# Patient Record
Sex: Male | Born: 1975 | Race: Black or African American | Hispanic: No | Marital: Married | Smoking: Current every day smoker
Health system: Southern US, Community
[De-identification: ages and names within clinical notes are randomized; demographics above are authoritative.]

## PROBLEM LIST (undated history)

## (undated) DIAGNOSIS — E119 Type 2 diabetes mellitus without complications: Secondary | ICD-10-CM

## (undated) DIAGNOSIS — L0291 Cutaneous abscess, unspecified: Secondary | ICD-10-CM

## (undated) DIAGNOSIS — M1990 Unspecified osteoarthritis, unspecified site: Secondary | ICD-10-CM

## (undated) DIAGNOSIS — I10 Essential (primary) hypertension: Secondary | ICD-10-CM

## (undated) DIAGNOSIS — L988 Other specified disorders of the skin and subcutaneous tissue: Secondary | ICD-10-CM

## (undated) DIAGNOSIS — E669 Obesity, unspecified: Secondary | ICD-10-CM

---

## 1998-05-07 ENCOUNTER — Emergency Department (HOSPITAL_COMMUNITY): Admission: EM | Admit: 1998-05-07 | Discharge: 1998-05-07 | Payer: Self-pay | Admitting: Emergency Medicine

## 2000-11-20 ENCOUNTER — Emergency Department (HOSPITAL_COMMUNITY): Admission: EM | Admit: 2000-11-20 | Discharge: 2000-11-20 | Payer: Self-pay | Admitting: Emergency Medicine

## 2003-04-15 ENCOUNTER — Emergency Department (HOSPITAL_COMMUNITY): Admission: EM | Admit: 2003-04-15 | Discharge: 2003-04-15 | Payer: Self-pay | Admitting: Emergency Medicine

## 2003-04-15 ENCOUNTER — Encounter: Payer: Self-pay | Admitting: Emergency Medicine

## 2003-05-22 ENCOUNTER — Emergency Department (HOSPITAL_COMMUNITY): Admission: EM | Admit: 2003-05-22 | Discharge: 2003-05-22 | Payer: Self-pay | Admitting: Emergency Medicine

## 2003-05-22 ENCOUNTER — Encounter: Payer: Self-pay | Admitting: Emergency Medicine

## 2003-10-09 ENCOUNTER — Emergency Department (HOSPITAL_COMMUNITY): Admission: EM | Admit: 2003-10-09 | Discharge: 2003-10-09 | Payer: Self-pay | Admitting: Emergency Medicine

## 2003-11-14 ENCOUNTER — Emergency Department (HOSPITAL_COMMUNITY): Admission: EM | Admit: 2003-11-14 | Discharge: 2003-11-14 | Payer: Self-pay | Admitting: Emergency Medicine

## 2004-10-19 ENCOUNTER — Emergency Department (HOSPITAL_COMMUNITY): Admission: EM | Admit: 2004-10-19 | Discharge: 2004-10-19 | Payer: Self-pay | Admitting: Family Medicine

## 2005-02-26 ENCOUNTER — Emergency Department (HOSPITAL_COMMUNITY): Admission: EM | Admit: 2005-02-26 | Discharge: 2005-02-26 | Payer: Self-pay | Admitting: Emergency Medicine

## 2005-08-03 ENCOUNTER — Emergency Department (HOSPITAL_COMMUNITY): Admission: EM | Admit: 2005-08-03 | Discharge: 2005-08-03 | Payer: Self-pay | Admitting: Family Medicine

## 2005-10-22 ENCOUNTER — Emergency Department (HOSPITAL_COMMUNITY): Admission: EM | Admit: 2005-10-22 | Discharge: 2005-10-22 | Payer: Self-pay | Admitting: Emergency Medicine

## 2005-11-03 ENCOUNTER — Emergency Department (HOSPITAL_COMMUNITY): Admission: EM | Admit: 2005-11-03 | Discharge: 2005-11-03 | Payer: Self-pay | Admitting: Family Medicine

## 2012-10-25 ENCOUNTER — Encounter (HOSPITAL_COMMUNITY): Payer: Self-pay | Admitting: *Deleted

## 2012-10-25 ENCOUNTER — Emergency Department (HOSPITAL_COMMUNITY)
Admission: EM | Admit: 2012-10-25 | Discharge: 2012-10-25 | Disposition: A | Discharge status: Home / Self Care | Payer: Self-pay | Attending: Emergency Medicine | Admitting: Emergency Medicine

## 2012-10-25 ENCOUNTER — Emergency Department (INDEPENDENT_AMBULATORY_CARE_PROVIDER_SITE_OTHER)
Admission: EM | Admit: 2012-10-25 | Discharge: 2012-10-25 | Disposition: A | Discharge status: Nursing Home (Includes ICF) | Payer: Self-pay | Source: Home / Self Care | Attending: Family Medicine | Admitting: Family Medicine

## 2012-10-25 ENCOUNTER — Emergency Department (HOSPITAL_COMMUNITY): Admission: EM | Admit: 2012-10-25 | Discharge: 2012-10-25 | Discharge status: Against Medical Advice | Payer: Self-pay

## 2012-10-25 DIAGNOSIS — Z79899 Other long term (current) drug therapy: Secondary | ICD-10-CM | POA: Insufficient documentation

## 2012-10-25 DIAGNOSIS — L0390 Cellulitis, unspecified: Secondary | ICD-10-CM

## 2012-10-25 DIAGNOSIS — I10 Essential (primary) hypertension: Secondary | ICD-10-CM | POA: Insufficient documentation

## 2012-10-25 DIAGNOSIS — F17200 Nicotine dependence, unspecified, uncomplicated: Secondary | ICD-10-CM | POA: Insufficient documentation

## 2012-10-25 DIAGNOSIS — L0291 Cutaneous abscess, unspecified: Secondary | ICD-10-CM

## 2012-10-25 DIAGNOSIS — L0211 Cutaneous abscess of neck: Secondary | ICD-10-CM | POA: Insufficient documentation

## 2012-10-25 DIAGNOSIS — I169 Hypertensive crisis, unspecified: Secondary | ICD-10-CM

## 2012-10-25 HISTORY — DX: Essential (primary) hypertension: I10

## 2012-10-25 HISTORY — DX: Cutaneous abscess, unspecified: L02.91

## 2012-10-25 LAB — POCT I-STAT, CHEM 8
BUN: 6 mg/dL (ref 6–23)
Calcium, Ion: 1.18 mmol/L (ref 1.12–1.23)
Chloride: 109 mEq/L (ref 96–112)
Creatinine, Ser: 1.2 mg/dL (ref 0.50–1.35)
Glucose, Bld: 102 mg/dL — ABNORMAL HIGH (ref 70–99)
Potassium: 3.9 mEq/L (ref 3.5–5.1)

## 2012-10-25 LAB — COMPREHENSIVE METABOLIC PANEL
ALT: 69 U/L — ABNORMAL HIGH (ref 0–53)
AST: 91 U/L — ABNORMAL HIGH (ref 0–37)
Albumin: 3.8 g/dL (ref 3.5–5.2)
CO2: 19 mEq/L (ref 19–32)
Calcium: 9.7 mg/dL (ref 8.4–10.5)
Creatinine, Ser: 0.97 mg/dL (ref 0.50–1.35)
Sodium: 136 mEq/L (ref 135–145)
Total Protein: 8.1 g/dL (ref 6.0–8.3)

## 2012-10-25 LAB — CBC
MCH: 34.1 pg — ABNORMAL HIGH (ref 26.0–34.0)
Platelets: 313 10*3/uL (ref 150–400)
RBC: 4.64 MIL/uL (ref 4.22–5.81)
RDW: 13 % (ref 11.5–15.5)

## 2012-10-25 MED ORDER — CEPHALEXIN 500 MG PO CAPS: 500.0000 mg | ORAL_CAPSULE | Freq: Four times a day (QID) | ORAL | Status: DC

## 2012-10-25 MED ORDER — HYDROCODONE-ACETAMINOPHEN 5-325 MG PO TABS
2.0000 | ORAL_TABLET | Freq: Once | ORAL | Status: AC
  Administered 2012-10-25: 2 via ORAL

## 2012-10-25 MED ORDER — SULFAMETHOXAZOLE-TRIMETHOPRIM 800-160 MG PO TABS: 2.0000 | ORAL_TABLET | Freq: Two times a day (BID) | ORAL | Status: AC

## 2012-10-25 MED ORDER — HYDROCODONE-ACETAMINOPHEN 5-325 MG PO TABS: 2.0000 | ORAL_TABLET | Freq: Four times a day (QID) | ORAL | Status: DC | PRN

## 2012-10-25 MED ORDER — HYDROCODONE-ACETAMINOPHEN 5-325 MG PO TABS
ORAL_TABLET | ORAL | Status: AC
  Filled 2012-10-25: qty 2

## 2012-10-25 NOTE — ED Notes (Signed)
Pt is alert and oriented. Ambulatory. HR 97. Pain to back of neck and left shoulder/back. Abscess to back of neck, no drainage at this time.

## 2012-10-25 NOTE — ED Notes (Signed)
PT was sent here from [facility] for elevated HR and complicated abscess that needs draining.  Pt c/o pain to post neck that radiates down spine and to both shoulders.  Pt is being seen by dermatologist for re-current skin infections.  PT tachycardic and in great pain.

## 2012-10-25 NOTE — ED Provider Notes (Signed)
History     CSN: [id number]  Arrival date & time 10/25/12  1708   First MD Initiated Contact with Patient 10/25/12 1807      Chief Complaint  Patient presents with  . Neck Pain    (Consider location/radiation/quality/duration/timing/severity/associated sxs/prior treatment) HPI Comments: Patient is a 37 y/o male who presents to the ED complaining of neck pain and elevated blood pressure.  Patient went to UC and was sent here because he was told he had an elevated HR and a complicated abscess that needed to be drained at the ED.  Abscess located on the posterior neck.  Patient noticed abscess two weeks ago.  Area becoming progressively worse.  Abscess is painful.   Pain is a 10/10  pain that radiates down his back and his left trapezius.  He states that he finds minor relief of pain with the use of heating packs, ibuprofen, Goody's powder, Xanax, alcohol usage, and smoking.  Pain is aggravated by movement.  Patient states that he has gotten several abscesses over the past four to five years, and have had several I&D's.  He has had abscesses and incisions done on this area of his neck in the past.  He denies fever or chills.  He has not noticed any drainage from this area.  No nausea or vomiting.    Patient is a 37 y.o. male presenting with abscess. The history is provided by the patient. No language interpreter was used.  Abscess  Pertinent negatives include no fever and no vomiting.    Past Medical History  Diagnosis Date  . Abscess     recurrent infections  . Hypertension     No past surgical history on file.  No family history on file.  History  Substance Use Topics  . Smoking status: Current Every Day Smoker  . Smokeless tobacco: Not on file  . Alcohol Use: Yes      Review of Systems  Constitutional: Negative for fever and chills.  Respiratory: Negative for chest tightness and shortness of breath.   Gastrointestinal: Negative for vomiting.  Neurological: Negative for  dizziness and light-headedness.  All other systems reviewed and are negative.    Allergies  Review of patient's allergies indicates no known allergies.  Home Medications   Current Outpatient Rx  Name  Route  Sig  Dispense  Refill  . ALPRAZOLAM 1 MG PO TABS   Oral   Take 1 mg by mouth at bedtime as needed. For sleep         . GOODY HEADACHE PO   Oral   Take 2 tablets by mouth every 2 (two) hours as needed. For pain         . IBUPROFEN 600 MG PO TABS   Oral   Take 600 mg by mouth at bedtime as needed. For pain         . SULFAMETHOXAZOLE-TMP DS 800-160 MG PO TABS   Oral   Take 1 tablet by mouth 2 (two) times daily.           BP 162/102  Pulse 113  Temp 98.1 F (36.7 C) (Oral)  Resp 18  SpO2 99%  Physical Exam  Nursing note and vitals reviewed. Constitutional: He is oriented to person, place, and time. He appears well-developed and well-nourished. No distress.  HENT:  Head: Normocephalic and atraumatic.  Mouth/Throat: Oropharynx is clear and moist. No oropharyngeal exudate.  Eyes: Conjunctivae normal and EOM are normal. Pupils are equal, round, and reactive to  light. Right eye exhibits no discharge. Left eye exhibits no discharge. No scleral icterus.  Neck: Normal range of motion. No Brudzinski's sign noted.    Cardiovascular: Normal rate, regular rhythm and normal heart sounds.  Exam reveals no gallop and no friction rub.   No murmur heard. Pulmonary/Chest: Effort normal and breath sounds normal. No respiratory distress. He has no wheezes. He has no rales. He exhibits no tenderness.  Musculoskeletal: Normal range of motion.  Lymphadenopathy:    He has cervical adenopathy.  Neurological: He is alert and oriented to person, place, and time. No cranial nerve deficit.  Skin: Skin is warm and dry. No rash noted. He is not diaphoretic. No erythema. No pallor.  Psychiatric: He has a normal mood and affect. His behavior is normal.    ED Course  Procedures  (including critical care time)  Labs Reviewed - No data to display No results found.   No diagnosis found.  INCISION AND DRAINAGE Performed by: Anne Shutter, Destanee Bedonie Consent: Verbal consent obtained. Risks and benefits: risks, benefits and alternatives were discussed Type: abscess  Body area: posterior neck  Anesthesia: local infiltration  Incision was made with a scalpel.  Local anesthetic: lidocaine 2% with epinephrine  Anesthetic total: 5 ml  Complexity: complex Blunt dissection to break up loculations  Drainage: purulent  Drainage amount: large  Patient tolerance: Patient tolerated the procedure well with no immediate complications.    MDM  Patient with skin abscess amenable to incision and drainage.  Abscess was not large enough to warrant packing or drain,  wound recheck in 2 days. Encouraged home warm soaks and flushing.  Some signs of cellulitis is surrounding skin.  Will d/c to home.  Patient discharged home with short course of antibiotics and pain medication.        Pascal Lux Sheldon, PA-C 10/26/12 [phone]

## 2012-10-25 NOTE — ED Notes (Signed)
Pt has  A  Boil  On the  Back   Of  His  Neck          Which  Her  reports  He  Has  Had  For  sev   Weeks   His  bp is  High   -  He  Has  No  histrory      Of  Any  htn  He  Is  Obese    Drinks  And  Smokes   He  denys  Any  Chest pain or  Shortness  Of  Breath      He  Does report  He  Has  Had  High  Blood  Pressure  In past

## 2012-10-25 NOTE — ED Notes (Signed)
Pt discharged.Vital signs stable and GCS 15 

## 2012-10-25 NOTE — ED Notes (Deleted)
Pt c/o abd pain onset yesterday. Nausea without vomiting.  Last BM yesterday.  St's had elevated temp yesterday.  Pt also c/o productive cough

## 2012-10-25 NOTE — ED Provider Notes (Signed)
History     CSN: [id number]  Arrival date & time 10/25/12  1525   First MD Initiated Contact with Patient 10/25/12 1530      Chief Complaint  Patient presents with  . Recurrent Skin Infections    (Consider location/radiation/quality/duration/timing/severity/associated sxs/prior treatment) HPI Comments: 37 year old smoker male with history of morbid obesity and untreated hypertension. Admits to frequent alcohol drinking. Here complaining of neck pain for one week worsening in the last 2 days associated with chills and general malaise. Denies blurry vision, chest pain or shortness of breath. Also denies extremity weakness, numbness or paresthesias, no balance or gait problems. States that he's had recurrent skin infections that have required incision and drainage in the back of his neck. Denies prior diagnosis of diabetes.    History reviewed. No pertinent past medical history.  History reviewed. No pertinent past surgical history.  No family history on file.  History  Substance Use Topics  . Smoking status: Current Every Day Smoker  . Smokeless tobacco: Not on file  . Alcohol Use: Yes      Review of Systems  Constitutional: Positive for chills. Negative for fever.  Eyes: Negative for visual disturbance.  Respiratory: Negative for chest tightness and shortness of breath.   Cardiovascular: Negative for chest pain.  Skin: Positive for rash.  Neurological: Negative for dizziness and headaches.    Allergies  Review of patient's allergies indicates no known allergies.  Home Medications   Current Outpatient Rx  Name  Route  Sig  Dispense  Refill  . SEPTRA PO   Oral   Take by mouth.           BP 207/129  Pulse 123  Temp 99.8 F (37.7 C) (Oral)  Resp 20  SpO2 95%  Physical Exam  Nursing note and vitals reviewed. Constitutional: He is oriented to person, place, and time. He appears well-developed and well-nourished. No distress.  HENT:  Head: Normocephalic and  atraumatic.  Right Ear: External ear normal.  Left Ear: External ear normal.  Mouth/Throat: Oropharynx is clear and moist. No oropharyngeal exudate.  Eyes: Conjunctivae normal and EOM are normal. Pupils are equal, round, and reactive to light. Right eye exhibits no discharge. Left eye exhibits no discharge. No scleral icterus.  Neck: Neck supple. No JVD present. No thyromegaly present.  Cardiovascular: Normal rate, regular rhythm and normal heart sounds.   Pulmonary/Chest: Effort normal and breath sounds normal.  Lymphadenopathy:    He has no cervical adenopathy.  Neurological: He is alert and oriented to person, place, and time. No cranial nerve deficit. He exhibits normal muscle tone. Coordination normal.  Skin: He is not diaphoretic.       There is a large area over 10 cm diameter of erythema induration and tenderness in the suboccipital area. There is central fluctuation. Consistent with a large abscess. No spontaneous drainage.    ED Course  Procedures (including critical care time)  Labs Reviewed  POCT I-STAT, CHEM 8 - Abnormal; Notable for the following:    Glucose, Bld 102 (*)     All other components within normal limits  CBC  COMPREHENSIVE METABOLIC PANEL   No results found.   1. Cellulitis and abscess   2. Hypertensive crisis     EKG with sinus tachycardia and ventricular rate at 118 beats per minutes, LVH signs  No acute ischemic changes.    MDM   37 year old smoker male with history of morbid obesity and untreated hypertension. Here complaining of neck pain  for one week worsening in the last 2 days associated with chills and general malaise.  On exam: Afebrile, tachycardic with a heart rate in the 120s. Hypertensive with a blood pressure of 207/129. There is an area of severe tenderness and induration over 10 cm diameter in the suboccipital area there is a central fluctuation and skin erythema with chronic scarring. Normal electrolytes and creatinine no hyperglycemia  on i-STAT.Patient was given Norco oral 500/10mg  here prior to transfer to the emergency department. CBC and complete metabolic panel results pending at the time of transfer Decided to transfer to the emergency department for further evaluation and management.        Sharin Grave, MD 10/25/12 1642

## 2012-10-26 NOTE — ED Provider Notes (Signed)
Medical screening examination/treatment/procedure(s) were performed by non-physician practitioner and as supervising physician I was immediately available for consultation/collaboration.   Gwyneth Sprout, MD 10/26/12 [phone]

## 2013-07-09 ENCOUNTER — Encounter (HOSPITAL_COMMUNITY): Payer: Self-pay | Admitting: *Deleted

## 2013-07-09 ENCOUNTER — Emergency Department (HOSPITAL_COMMUNITY)
Admission: EM | Admit: 2013-07-09 | Discharge: 2013-07-09 | Disposition: A | Discharge status: Home / Self Care | Payer: Self-pay | Attending: Emergency Medicine | Admitting: Emergency Medicine

## 2013-07-09 DIAGNOSIS — I10 Essential (primary) hypertension: Secondary | ICD-10-CM | POA: Insufficient documentation

## 2013-07-09 DIAGNOSIS — L0390 Cellulitis, unspecified: Secondary | ICD-10-CM

## 2013-07-09 DIAGNOSIS — L0211 Cutaneous abscess of neck: Secondary | ICD-10-CM | POA: Insufficient documentation

## 2013-07-09 DIAGNOSIS — L0291 Cutaneous abscess, unspecified: Secondary | ICD-10-CM

## 2013-07-09 DIAGNOSIS — F17200 Nicotine dependence, unspecified, uncomplicated: Secondary | ICD-10-CM | POA: Insufficient documentation

## 2013-07-09 DIAGNOSIS — E669 Obesity, unspecified: Secondary | ICD-10-CM | POA: Insufficient documentation

## 2013-07-09 HISTORY — DX: Obesity, unspecified: E66.9

## 2013-07-09 MED ORDER — IBUPROFEN 800 MG PO TABS: 800.0000 mg | ORAL_TABLET | Freq: Three times a day (TID) | ORAL | Status: DC

## 2013-07-09 MED ORDER — SULFAMETHOXAZOLE-TRIMETHOPRIM 800-160 MG PO TABS: 1.0000 | ORAL_TABLET | Freq: Two times a day (BID) | ORAL | Status: DC

## 2013-07-09 MED ORDER — TRAMADOL HCL 50 MG PO TABS
50.0000 mg | ORAL_TABLET | Freq: Once | ORAL | Status: AC
  Administered 2013-07-09: 50 mg via ORAL
  Filled 2013-07-09: qty 1

## 2013-07-09 MED ORDER — CEPHALEXIN 500 MG PO CAPS: 500.0000 mg | ORAL_CAPSULE | Freq: Four times a day (QID) | ORAL | Status: DC

## 2013-07-09 NOTE — ED Notes (Signed)
Reports having abscess to back of neck x 2 weeks, causing pain. Airway intact.

## 2013-07-09 NOTE — ED Notes (Signed)
Abscess to posterior neck. Hx of same.

## 2013-07-09 NOTE — ED Provider Notes (Signed)
CSN: [id number]     Arrival date & time 07/09/13  1018 History   First MD Initiated Contact with Patient 07/09/13 1116     Chief Complaint  Patient presents with  . Abscess   (Consider location/radiation/quality/duration/timing/severity/associated sxs/prior Treatment) HPI Comments: Patient is a 37 yo M PMHx significant for obesity presenting to the ED for recurrent posterior neck abscess that has been worsening over the last two weeks. Patient states the area has been becoming more and more tender to the touch. He states he has tried warm compresses with minimal relief. He states touching the area aggravates his pain. Patient states he feels as though the area is increasing in size. He denies any fevers, chills, drainage from abscess, headache.    Past Medical History  Diagnosis Date  . Abscess     recurrent infections  . Hypertension   . Obesity    History reviewed. No pertinent past surgical history. History reviewed. No pertinent family history. History  Substance Use Topics  . Smoking status: Current Every Day Smoker  . Smokeless tobacco: Not on file  . Alcohol Use: Yes    Review of Systems  Constitutional: Negative for fever and chills.  HENT: Negative for facial swelling, neck pain and neck stiffness.   Respiratory: Negative for shortness of breath.   Cardiovascular: Negative for chest pain.  Skin: Positive for color change and wound.  Neurological: Negative for syncope and headaches.  All other systems reviewed and are negative.    Allergies  Review of patient's allergies indicates no known allergies.  Home Medications   Current Outpatient Rx  Name  Route  Sig  Dispense  Refill  . ibuprofen (ADVIL,MOTRIN) 600 MG tablet   Oral   Take 600 mg by mouth at bedtime as needed. For pain          BP 180/112  Pulse 109  Temp(Src) 97.9 F (36.6 C)  Resp 20  SpO2 94% Physical Exam  Constitutional: He is oriented to person, place, and time. He appears  well-developed and well-nourished. No distress.  HENT:  Head: Normocephalic and atraumatic.  Right Ear: External ear normal.  Left Ear: External ear normal.  Nose: Nose normal.  Mouth/Throat: Oropharynx is clear and moist. No oropharyngeal exudate.  Eyes: Conjunctivae are normal.  Neck: Normal range of motion and full passive range of motion without pain. Neck supple. No spinous process tenderness and no muscular tenderness present. No rigidity. No edema present.  Cardiovascular: Normal rate.   Pulmonary/Chest: Effort normal and breath sounds normal.  Lymphadenopathy:    He has no cervical adenopathy.  Neurological: He is alert and oriented to person, place, and time.  Skin: Skin is warm and dry. He is not diaphoretic.  6 cm x 1 cm fluctuant area on posterior neck w/ mild surrounding erythema and warmth. No drainage. TTP.   Psychiatric: He has a normal mood and affect.    ED Course  Procedures (including critical care time) Medications  traMADol (ULTRAM) tablet 50 mg (50 mg Oral Given 07/09/13 1214)   INCISION AND DRAINAGE Performed by: Francee Piccolo L Consent: Verbal consent obtained. Risks and benefits: risks, benefits and alternatives were discussed Type: abscess  Body area: posterior neck  Anesthesia: local infiltration  Incision was made with a scalpel.  Local anesthetic: lidocaine 2% w/ epinephrine  Anesthetic total: 4 ml  Complexity: complex Blunt dissection to break up loculations  Drainage: purulent  Drainage amount: moderate  Patient tolerance: Patient tolerated the procedure well with  no immediate complications.    Labs Review Labs Reviewed - No data to display Imaging Review No results found.  MDM   1. Abscess   2. Cellulitis     Afebrile, NAD, non-toxic appearing, AAOx4.   Suspect uncomplicated cellulitis based on limited area of involvement, minimal pain, no systemic signs of illness (eg, fever, chills, dehydration, altered mental  status, tachypnea, tachycardia, hypotension), no risk factors for serious illness (eg, extremes of age, general debility, immunocompromised status). PE reveals redness, swelling, mildly tender, warm to touch. Skin intact, No bleeding. No bullae. Non purulent. Non circumferential.  Fluctuant area. Borders are not elevated or sharply demarcated. Will prescribed Bactrim and Keflex to cover for MRSA, direct pt to apply warm compresses and to return to ED for I&D if pain should increase or abscess should develop. Advised PCP f/u.   Patient noted to be hypertensive in the emergency department.  No signs of hypertensive urgency.  Discussed with patient the need for close follow-up and management by their primary care physician.   Patient agreeable to plan. Patient is stable at time of discharge    Jeannetta Ellis, PA-C 07/09/13 1459

## 2013-07-09 NOTE — ED Provider Notes (Signed)
Medical screening examination/treatment/procedure(s) were performed by non-physician practitioner and as supervising physician I was immediately available for consultation/collaboration.  Rebeca Valdivia M Clydine Parkison, MD 07/09/13 1558 

## 2013-11-06 ENCOUNTER — Encounter (HOSPITAL_COMMUNITY): Payer: Self-pay | Admitting: Emergency Medicine

## 2013-11-06 ENCOUNTER — Emergency Department (HOSPITAL_COMMUNITY)
Admission: EM | Admit: 2013-11-06 | Discharge: 2013-11-06 | Disposition: A | Discharge status: Home / Self Care | Payer: Self-pay | Source: Home / Self Care

## 2013-11-06 DIAGNOSIS — L03221 Cellulitis of neck: Secondary | ICD-10-CM

## 2013-11-06 DIAGNOSIS — L0211 Cutaneous abscess of neck: Secondary | ICD-10-CM

## 2013-11-06 MED ORDER — CEPHALEXIN 500 MG PO CAPS: 500.0000 mg | ORAL_CAPSULE | Freq: Three times a day (TID) | ORAL | Status: DC

## 2013-11-06 MED ORDER — SULFAMETHOXAZOLE-TRIMETHOPRIM 800-160 MG PO TABS: 1.0000 | ORAL_TABLET | Freq: Two times a day (BID) | ORAL | Status: AC

## 2013-11-06 NOTE — Discharge Instructions (Signed)
Abscess An abscess is an infected area that contains a collection of pus and debris.It can occur in almost any part of the body. An abscess is also known as a furuncle or boil. CAUSES  An abscess occurs when tissue gets infected. This can occur from blockage of oil or sweat glands, infection of hair follicles, or a minor injury to the skin. As the body tries to fight the infection, pus collects in the area and creates pressure under the skin. This pressure causes pain. People with weakened immune systems have difficulty fighting infections and get certain abscesses more often.  SYMPTOMS Usually an abscess develops on the skin and becomes a painful mass that is red, warm, and tender. If the abscess forms under the skin, you may feel a moveable soft area under the skin. Some abscesses break open (rupture) on their own, but most will continue to get worse without care. The infection can spread deeper into the body and eventually into the bloodstream, causing you to feel ill.  DIAGNOSIS  Your caregiver will take your medical history and perform a physical exam. A sample of fluid may also be taken from the abscess to determine what is causing your infection. TREATMENT  Your caregiver may prescribe antibiotic medicines to fight the infection. However, taking antibiotics alone usually does not cure an abscess. Your caregiver may need to make a small cut (incision) in the abscess to drain the pus. In some cases, gauze is packed into the abscess to reduce pain and to continue draining the area. HOME CARE INSTRUCTIONS   Only take over-the-counter or prescription medicines for pain, discomfort, or fever as directed by your caregiver.  If you were prescribed antibiotics, take them as directed. Finish them even if you start to feel better.  If gauze is used, follow your caregiver's directions for changing the gauze.  To avoid spreading the infection:  Keep your draining abscess covered with a  bandage.  Wash your hands well.  Do not share personal care items, towels, or whirlpools with others.  Avoid skin contact with others.  Keep your skin and clothes clean around the abscess.  Keep all follow-up appointments as directed by your caregiver. SEEK MEDICAL CARE IF:   You have increased pain, swelling, redness, fluid drainage, or bleeding.  You have muscle aches, chills, or a general ill feeling.  You have a fever. MAKE SURE YOU:   Understand these instructions.  Will watch your condition.  Will get help right away if you are not doing well or get worse. Document Released: 07/06/2005 Document Revised: 03/27/2012 Document Reviewed: 12/09/2011 [employer] Patient Information 2014 [employer].  

## 2013-11-06 NOTE — ED Provider Notes (Signed)
CSN: [id number]     Arrival date & time 11/06/13  1224 History   First MD Initiated Contact with Patient 11/06/13 1404     Chief Complaint  Patient presents with  . Abscess   (Consider location/radiation/quality/duration/timing/severity/associated sxs/prior Treatment) HPI Comments: 38 year old morbidly obese male presents with recurring abscesses. This one is located in the back of the neck where most of them are. He states about 3-4 days ago the abscess grew quite large and covered the inferior aspect of the occiput as well as most of the posterior neck. He states it spontaneously ruptured and he has been applying warm compresses and now is approximately 3 cm in length with 1 cm surrounding induration. He says it is greatly improved. It has been incised and drained several times both at the urgent care in emergency department. He is here for antibiotics. He is under the impression that he should be taking cephalexin and Septra on a continual, long-term basis.   Past Medical History  Diagnosis Date  . Abscess     recurrent infections  . Hypertension   . Obesity    History reviewed. No pertinent past surgical history. History reviewed. No pertinent family history. History  Substance Use Topics  . Smoking status: Current Every Day Smoker  . Smokeless tobacco: Not on file  . Alcohol Use: Yes    Review of Systems  Constitutional: Negative.   Respiratory: Negative.   Gastrointestinal: Negative.   Genitourinary: Negative.   Neurological: Negative.     Allergies  Review of patient's allergies indicates no known allergies.  Home Medications   Current Outpatient Rx  Name  Route  Sig  Dispense  Refill  . cephALEXin (KEFLEX) 500 MG capsule   Oral   Take 1 capsule (500 mg total) by mouth 3 (three) times daily.   42 capsule   0   . sulfamethoxazole-trimethoprim (BACTRIM DS,SEPTRA DS) 800-160 MG per tablet   Oral   Take 1 tablet by mouth 2 (two) times daily.   28 tablet   0     BP 165/99  Pulse 112  Temp(Src) 99.2 F (37.3 C) (Oral)  Resp 18  Ht 5\' 10"  (1.778 m)  Wt 300 lb (136.079 kg)  BMI 43.05 kg/m2  SpO2 97% Physical Exam  Nursing note and vitals reviewed. Constitutional: He is oriented to person, place, and time. He appears well-developed. No distress.  Morbidly obese  Eyes: Conjunctivae and EOM are normal.  Neck: Normal range of motion. Neck supple.  Pulmonary/Chest: Effort normal. No respiratory distress.  Neurological: He is alert and oriented to person, place, and time.  Skin: Skin is warm and dry.  There is a approximately 3 cm in length area of superficial fluctuants with approximately 1 cm of induration surrounding it. No erythema or lymphangitis. There is no longer swelling and tenderness of the scalp or bilateral neck. He has full range of motion of the neck.  Psychiatric: He has a normal mood and affect.    ED Course  Procedures (including critical care time) Labs Review Labs Reviewed - No data to display Imaging Review No results found.    MDM   1. Abscess of neck    Septra DS 1 twice a day for 14 days Keflex 500 mg 3 times a day for 14 days Explained to the patient that if he was advised to take long-term antibiotics that he would have to discuss this with her PCP in that the urgent care when I been able to  supply this. One problem is that there is no documentation of culture results from the prior incisions and drainage. Therefore, we are uncertain as to the organism that were treating. There is no drainage during this exam and the patient does not want an incision made to obtain a culture. Patient states both Keflex and Septra worked in the past but usually used together. Continue the warm compresses and start antibiotics.    Hayden Rasmussenavid Mabe, NP 11/06/13 1431  Medical screening examination/treatment/procedure(s) were performed by a resident physician or non-physician practitioner and as the supervising physician I was  immediately available for consultation/collaboration.  Clementeen GrahamEvan Haydon Kalmar, MD    Rodolph BongEvan S Cherae Marton, MD 11/06/13 [phone]

## 2013-11-06 NOTE — ED Notes (Signed)
Pt  Recurrent      Boils     Back   Of  Neck         Pt  Reports the  abcess bursted  2  Days  Ago  Now  The  Area  Is    Hard to  Touch     Pt  Reports  He  Has  Been  Applying  Warm  Compresses  To  The  Affected  Area

## 2014-03-20 ENCOUNTER — Encounter (HOSPITAL_COMMUNITY): Payer: Self-pay | Admitting: Emergency Medicine

## 2014-03-20 ENCOUNTER — Emergency Department (INDEPENDENT_AMBULATORY_CARE_PROVIDER_SITE_OTHER)
Admission: EM | Admit: 2014-03-20 | Discharge: 2014-03-20 | Disposition: A | Discharge status: Home / Self Care | Payer: No Typology Code available for payment source | Source: Home / Self Care

## 2014-03-20 DIAGNOSIS — S81009A Unspecified open wound, unspecified knee, initial encounter: Secondary | ICD-10-CM

## 2014-03-20 DIAGNOSIS — X58XXXA Exposure to other specified factors, initial encounter: Secondary | ICD-10-CM

## 2014-03-20 DIAGNOSIS — L089 Local infection of the skin and subcutaneous tissue, unspecified: Secondary | ICD-10-CM

## 2014-03-20 DIAGNOSIS — S91009A Unspecified open wound, unspecified ankle, initial encounter: Secondary | ICD-10-CM

## 2014-03-20 DIAGNOSIS — S81809A Unspecified open wound, unspecified lower leg, initial encounter: Secondary | ICD-10-CM

## 2014-03-20 DIAGNOSIS — S81811A Laceration without foreign body, right lower leg, initial encounter: Principal | ICD-10-CM

## 2014-03-20 DIAGNOSIS — S8780XA Crushing injury of unspecified lower leg, initial encounter: Secondary | ICD-10-CM

## 2014-03-20 DIAGNOSIS — Z23 Encounter for immunization: Secondary | ICD-10-CM

## 2014-03-20 MED ORDER — HYDROCODONE-ACETAMINOPHEN 5-325 MG PO TABS: 1.0000 | ORAL_TABLET | ORAL | Status: DC | PRN

## 2014-03-20 MED ORDER — CEPHALEXIN 500 MG PO CAPS: 500.0000 mg | ORAL_CAPSULE | Freq: Four times a day (QID) | ORAL | Status: DC

## 2014-03-20 MED ORDER — TETANUS-DIPHTH-ACELL PERTUSSIS 5-2.5-18.5 LF-MCG/0.5 IM SUSP
0.5000 mL | Freq: Once | INTRAMUSCULAR | Status: AC
  Administered 2014-03-20: 0.5 mL via INTRAMUSCULAR

## 2014-03-20 MED ORDER — TETANUS-DIPHTH-ACELL PERTUSSIS 5-2.5-18.5 LF-MCG/0.5 IM SUSP
INTRAMUSCULAR | Status: AC
  Filled 2014-03-20: qty 0.5

## 2014-03-20 NOTE — ED Provider Notes (Signed)
CSN: [id number]     Arrival date & time 03/20/14  1558 History   First MD Initiated Contact with Patient 03/20/14 1641     Chief Complaint  Patient presents with  . Leg Injury   (Consider location/radiation/quality/duration/timing/severity/associated sxs/prior Treatment) HPI Comments: 38 year old morbidly obese male presents with a vertical laceration to the distal right anterior shin. This laceration was produced by the corner of a metal toolbox. He elected not to seek medical attention. He has been cleaning it with alcohol and peroxide. There is now underlying swelling of the distal fourth of the shin and the wound is healing by secondary intention but draining a clear to yellow fluid.   Past Medical History  Diagnosis Date  . Abscess     recurrent infections  . Hypertension   . Obesity    History reviewed. No pertinent past surgical history. History reviewed. No pertinent family history. History  Substance Use Topics  . Smoking status: Current Every Day Smoker  . Smokeless tobacco: Not on file  . Alcohol Use: Yes    Review of Systems  Constitutional: Negative for fever.  Respiratory: Negative.   Gastrointestinal: Negative.   Genitourinary: Negative.   Skin: Positive for wound.  Neurological: Negative.     Allergies  Review of patient's allergies indicates no known allergies.  Home Medications   Prior to Admission medications   Medication Sig Start Date End Date Taking? Authorizing Provider  cephALEXin (KEFLEX) 500 MG capsule Take 1 capsule (500 mg total) by mouth 4 (four) times daily. 03/20/14   Hayden Rasmussen, NP   BP 197/111  Pulse 103  Temp(Src) 98.4 F (36.9 C) (Oral)  Resp 20  SpO2 97% Physical Exam  Nursing note and vitals reviewed. Constitutional: He is oriented to person, place, and time. He appears well-developed. No distress.  Pulmonary/Chest: Effort normal. No respiratory distress.  Neurological: He is alert and oriented to person, place, and time. He  exhibits normal muscle tone.  Skin: No rash noted.  8.5 cm x 2.5 cm vertical wound healing by secondary intention with a clear to yellow drainage. Minor erythema along the edges with swelling and mild induration in the the wound and surrounding the wound in the distal were of the right lower leg. No purulence.  Psychiatric: He has a normal mood and affect.    ED Course  Procedures (including critical care time) Labs Review Labs Reviewed - No data to display  Imaging Review No results found.   MDM   1. Laceration of right lower leg with infection     Keflex qid Tdap Wound cleaning and non stick dressing Clean wound with soap and water bid. norco 5 mg #15        Hayden Rasmussen, NP 03/20/14 1709

## 2014-03-20 NOTE — ED Notes (Signed)
Pt  Reports   He  Injured  His  r  Leg  About  3  Weeks  Ago  He        Scraped it on  Some  Metal      Lac  Present  With purullent  Material    Present  Unsure  As  To  His  Last  Tetanus  Shot        Also  Reports   Boil  On  Neck

## 2014-03-20 NOTE — Discharge Instructions (Signed)
Laceration, Old, Not Sutured A laceration is a cut or lesion that goes through all layers of the skin and into the tissue just beneath the skin. Usually these are stitched up or held together with tape or glue shortly after an injury. However, if several or more hours have passed before getting care, too many germs (bacteria) get into the wound. Stitching it closed at this point brings the risk of infection. If your caregiver feels your laceration is too old, it is sometimes left open and dressed regularly to allow healing from the bottom layer up. HOME CARE INSTRUCTIONS   You should change the dressing twice a day or as instructed by your caregiver. If the bandage or wound packing sticks, soak it off with soapy water. When you redress your wound, make sure that the dressing or packing goes all the way to the bottom of the wound. The top of the wound is kept open so it can heal from the bottom up. There is less chance for infection with this method.  Twice a day, wash the area with soap and water to remove all the creams or ointments if used. Rinse off the soap. Pat dry with a clean towel. Look for signs of infection (see below).  If the bandage becomes wet, dirty, or develops a foul smell, change it as soon as possible. Use a nonstick bandaid then cover with a more bulky dressing.  Only take over-the-counter or prescription medicines for pain, discomfort, or fever as directed by your caregiver. You might need a tetanus shot now if:  You have no idea when you had the last one.  You have never had a tetanus shot before.  Your cut had dirt in it.  Your lacertaion was dirty, and your last tetanus shot was more than 7 years ago.  Your laceration was clean, and your last tetanus shot was more than 10 years ago. If you need a tetanus shot, and you decide not to get one, there is a rare chance of getting tetanus. Sickness from tetanus can be serious. If you got a tetanus shot, your arm may swell, get  red and warm to the touch at the shot site. This is common and not a problem. SEEK MEDICAL CARE IF:   There is redness, swelling, or increasing pain in the wound.  There is a red line that goes up your arm or leg.  Pus is coming from wound.  You develop an unexplained oral temperature above 102 F (38.9 C).  You notice a foul smell coming from the wound or dressing.  You notice something coming out of the wound such as wood or glass.  The wound is on your hand or foot and you find that you are unable to properly move a finger or toe.  There is severe swelling around the wound causing pain and numbness.  There is a change in color in your arm, hand, leg, or foot. MAKE SURE YOU:   Understand these instructions.  Will watch your condition.  Will get help right away if you are not doing well or get worse. Document Released: 08/24/2006 Document Revised: 12/19/2011 Document Reviewed: 03/16/2009 [facility] Patient Information 2014 [place], [place].

## 2014-03-26 NOTE — ED Provider Notes (Signed)
Medical screening examination/treatment/procedure(s) were performed by a resident physician or non-physician practitioner and as the supervising physician I was immediately available for consultation/collaboration.  Shenique Childers, MD    Tasmin Exantus S Aaliyah Cancro, MD 03/26/14 0734 

## 2014-04-01 ENCOUNTER — Emergency Department (HOSPITAL_COMMUNITY)
Admission: EM | Admit: 2014-04-01 | Discharge: 2014-04-01 | Disposition: A | Discharge status: Home / Self Care | Payer: No Typology Code available for payment source | Source: Home / Self Care

## 2014-04-01 ENCOUNTER — Encounter (HOSPITAL_COMMUNITY): Payer: Self-pay | Admitting: Emergency Medicine

## 2014-04-01 DIAGNOSIS — T798XXA Other early complications of trauma, initial encounter: Secondary | ICD-10-CM

## 2014-04-01 DIAGNOSIS — IMO0002 Reserved for concepts with insufficient information to code with codable children: Secondary | ICD-10-CM

## 2014-04-01 DIAGNOSIS — L089 Local infection of the skin and subcutaneous tissue, unspecified: Secondary | ICD-10-CM

## 2014-04-01 DIAGNOSIS — X58XXXA Exposure to other specified factors, initial encounter: Secondary | ICD-10-CM

## 2014-04-01 DIAGNOSIS — T148XXA Other injury of unspecified body region, initial encounter: Secondary | ICD-10-CM

## 2014-04-01 MED ORDER — KETOROLAC TROMETHAMINE 60 MG/2ML IM SOLN
60.0000 mg | Freq: Once | INTRAMUSCULAR | Status: AC
  Administered 2014-04-01: 60 mg via INTRAMUSCULAR

## 2014-04-01 MED ORDER — KETOROLAC TROMETHAMINE 60 MG/2ML IM SOLN
INTRAMUSCULAR | Status: AC
  Filled 2014-04-01: qty 2

## 2014-04-01 MED ORDER — OXYCODONE-ACETAMINOPHEN 5-325 MG PO TABS: 1.0000 | ORAL_TABLET | Freq: Once | ORAL | Status: DC

## 2014-04-01 MED ORDER — DOXYCYCLINE HYCLATE 100 MG PO TABS: 100.0000 mg | ORAL_TABLET | Freq: Two times a day (BID) | ORAL | Status: DC

## 2014-04-01 MED ORDER — LIDOCAINE-EPINEPHRINE-TETRACAINE (LET) SOLUTION
3.0000 mL | Freq: Once | NASAL | Status: AC
  Administered 2014-04-01: 3 mL via TOPICAL

## 2014-04-01 MED ORDER — OXYCODONE-ACETAMINOPHEN 5-325 MG PO TABS: 1.0000 | ORAL_TABLET | ORAL | Status: DC | PRN

## 2014-04-01 NOTE — Discharge Instructions (Signed)
Your wound is healing very slowly and was infected The infection was cleaned out today Please do the wet to dry bandage changes daily Please come back in 1 week for further evaulation Please start the new antibitotics You can stop the wet to dry changes once the wound has stopped draining.

## 2014-04-01 NOTE — ED Provider Notes (Signed)
CSN: [id number]     Arrival date & time 04/01/14  1850 History   None    Chief Complaint  Patient presents with  . Follow-up   (Consider location/radiation/quality/duration/timing/severity/associated sxs/prior Treatment) HPI  Seen on 6/11 for laceration from metal toolbox. Started on Keflex w/ improvement. 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 Not sure if getting better. Daily washing w/ soap. Discharging clear material.    Past Medical History  Diagnosis Date  . Abscess     recurrent infections  . Hypertension   . Obesity    History reviewed. No pertinent past surgical history. No family history on file. History  Substance Use Topics  . Smoking status: Current Every Day Smoker -- 0.50 packs/day  . Smokeless tobacco: Not on file  . Alcohol Use: Yes    Review of Systems Per hpi w/ all other systems negative Allergies  Review of patient's allergies indicates no known allergies.  Home Medications   Prior to Admission medications   Medication Sig Start Date End Date Taking? Authorizing Provider  cephALEXin (KEFLEX) 500 MG capsule Take 1 capsule (500 mg total) by mouth 4 (four) times daily. 03/20/14  Yes Hayden Rasmussenavid Mabe, NP  HYDROcodone-acetaminophen (NORCO/VICODIN) 5-325 MG per tablet Take 1 tablet by mouth every 4 (four) hours as needed. 03/20/14  Yes Hayden Rasmussenavid Mabe, NP  doxycycline (VIBRA-TABS) 100 MG tablet Take 1 tablet (100 mg total) by mouth 2 (two) times daily. 04/01/14   Ozella Rocksavid J Merrell, MD  oxyCODONE-acetaminophen (PERCOCET/ROXICET) 5-325 MG per tablet Take 1 tablet by mouth every 4 (four) hours as needed for severe pain. 04/01/14   Ozella Rocksavid J Merrell, MD   BP 157/79  Pulse 114  Temp(Src) 97.8 F (36.6 C) (Oral)  SpO2 95% Physical Exam  Constitutional: He appears well-developed and well-nourished. No distress.  Eyes: EOM are normal. Pupils are equal, round, and reactive to light.  Cardiovascular: Normal rate, normal heart sounds and intact distal pulses.   Pulmonary/Chest: Effort  normal. No respiratory distress.  Abdominal: Soft. He exhibits no distension.  Musculoskeletal: Normal range of motion. He exhibits no edema.  Neurological: He is alert. He exhibits normal muscle tone.  Skin: He is not diaphoretic.  Large 8.5x1.5cm longitudinal laceration w/ central necrosis of the R anterior leg. surounding induration and ttp present. W/o purulent discharge.     2% lidocaine placed directly on wound and allowed to anesthetise skin. Wound debrided of nectoric tissue w/ blunt edge of qtip adn cotton swab. Pt tolerated well.    ED Course  Procedures (including critical care time) Labs Review Labs Reviewed - No data to display  Imaging Review No results found.   MDM   1. Wound infection, initial encounter   2. Laceration    Poor healing to this point. Wound debrided. Wet to dry bandage applied and pt instructed to continue daily wet to dry changes to keep the nectrotic tissue burden down. Pt to start doxy for additional coverage (MRSA). Unlikely pseudo as pt w/o DM. Percocet to use for pain as debridement will cause pain  Shelly Flattenavid Merrell, MD Family Medicine PGY-3 04/01/2014, 8:46 PM      Ozella Rocksavid J Merrell, MD 04/01/14 2046

## 2014-04-01 NOTE — ED Notes (Signed)
Applied LET to pt's wound site per Dr. Konrad DoloresMerrell

## 2014-04-01 NOTE — ED Notes (Signed)
Pt is here for a f/u for laceration to right lower extremity Seen here on 6/11; given Keflex and hydrocodone  Reports he'll probably need more meds; not getting any better Alert w/no signs of acute distress.

## 2014-04-04 NOTE — ED Provider Notes (Signed)
Medical screening examination/treatment/procedure(s) were performed by resident physician or non-physician practitioner and as supervising physician I was immediately available for consultation/collaboration.   KINDL,JAMES DOUGLAS MD.   James D Kindl, MD 04/04/14 1007 

## 2014-04-10 ENCOUNTER — Emergency Department (HOSPITAL_COMMUNITY)
Admission: EM | Admit: 2014-04-10 | Discharge: 2014-04-10 | Disposition: A | Discharge status: Home / Self Care | Payer: No Typology Code available for payment source | Source: Home / Self Care

## 2014-04-10 ENCOUNTER — Encounter (HOSPITAL_COMMUNITY): Payer: Self-pay | Admitting: Emergency Medicine

## 2014-04-10 DIAGNOSIS — S81009A Unspecified open wound, unspecified knee, initial encounter: Secondary | ICD-10-CM

## 2014-04-10 DIAGNOSIS — S81809A Unspecified open wound, unspecified lower leg, initial encounter: Secondary | ICD-10-CM

## 2014-04-10 DIAGNOSIS — Z5189 Encounter for other specified aftercare: Secondary | ICD-10-CM

## 2014-04-10 DIAGNOSIS — S91009A Unspecified open wound, unspecified ankle, initial encounter: Secondary | ICD-10-CM

## 2014-04-10 DIAGNOSIS — X58XXXA Exposure to other specified factors, initial encounter: Secondary | ICD-10-CM

## 2014-04-10 MED ORDER — HYDROCODONE-ACETAMINOPHEN 7.5-325 MG PO TABS: 1.0000 | ORAL_TABLET | ORAL | Status: DC | PRN

## 2014-04-10 NOTE — ED Notes (Signed)
Right lower leg wound check.  Patient feels this is improving, is requesting more antibiotic and pain medicine.

## 2014-04-10 NOTE — ED Provider Notes (Signed)
CSN: [id number]     Arrival date & time 04/10/14  1930 History   None    Chief Complaint  Patient presents with  . Wound Check   (Consider location/radiation/quality/duration/timing/severity/associated sxs/prior Treatment) HPI Comments: For wound check of non sutured laceration of the R lower leg. Has been on several weeks of ABX and narcotics. Is healing well by secondary intension. No signs of infection. No drainage or purulence   Past Medical History  Diagnosis Date  . Abscess     recurrent infections  . Hypertension   . Obesity    History reviewed. No pertinent past surgical history. History reviewed. No pertinent family history. History  Substance Use Topics  . Smoking status: Current Every Day Smoker -- 0.50 packs/day  . Smokeless tobacco: Not on file  . Alcohol Use: Yes    Review of Systems  Constitutional: Negative.   Skin: Positive for wound.    Allergies  Review of patient's allergies indicates no known allergies.  Home Medications   Prior to Admission medications   Medication Sig Start Date End Date Taking? Authorizing Provider  HYDROcodone-acetaminophen (NORCO) 7.5-325 MG per tablet Take 1 tablet by mouth every 4 (four) hours as needed. 04/10/14   Hayden Rasmussenavid Hazelynn Mckenny, NP   BP 168/99  Pulse 117  Temp(Src) 99.6 F (37.6 C) (Oral)  Resp 18  SpO2 100% Physical Exam  Nursing note and vitals reviewed. Constitutional: He is oriented to person, place, and time. He appears well-developed and well-nourished.  Neurological: He is alert and oriented to person, place, and time.  Skin: Skin is warm and dry. No erythema.    ED Course  Procedures (including critical care time) Labs Review Labs Reviewed - No data to display  Imaging Review No results found.   MDM   1. Encounter for post-traumatic wound check     Keepclean and dry, may cont wet to dry dressings. Norco 7.5 mg #15. Unlikely the wound is as painful as he describes. On narcotics and ABX for nearly 6  weeks now.     Hayden Rasmussenavid Hesham Womac, NP 04/10/14 [id number]  Hayden Rasmussenavid Issaac Shipper, NP 04/10/14 2051

## 2014-04-11 NOTE — ED Provider Notes (Signed)
Medical screening examination/treatment/procedure(s) were performed by a resident physician or non-physician practitioner and as the supervising physician I was immediately available for consultation/collaboration.  Alder Murri, MD    Clorine Swing S Lazette Estala, MD 04/11/14 1652 

## 2014-05-13 ENCOUNTER — Emergency Department (HOSPITAL_COMMUNITY)
Admission: EM | Admit: 2014-05-13 | Discharge: 2014-05-13 | Disposition: A | Discharge status: Home / Self Care | Payer: No Typology Code available for payment source | Source: Home / Self Care | Attending: Emergency Medicine | Admitting: Emergency Medicine

## 2014-05-13 ENCOUNTER — Encounter (HOSPITAL_COMMUNITY): Payer: Self-pay | Admitting: Emergency Medicine

## 2014-05-13 DIAGNOSIS — L0292 Furuncle, unspecified: Secondary | ICD-10-CM

## 2014-05-13 DIAGNOSIS — L0293 Carbuncle, unspecified: Secondary | ICD-10-CM

## 2014-05-13 MED ORDER — OXYCODONE-ACETAMINOPHEN 5-325 MG PO TABS: 2.0000 | ORAL_TABLET | ORAL | Status: DC | PRN

## 2014-05-13 MED ORDER — SULFAMETHOXAZOLE-TMP DS 800-160 MG PO TABS: 1.0000 | ORAL_TABLET | Freq: Two times a day (BID) | ORAL | Status: DC

## 2014-05-13 NOTE — ED Provider Notes (Signed)
CSN: [id number]     Arrival date & time 05/13/14  1711 History   First MD Initiated Contact with Patient 05/13/14 1723     Chief Complaint  Patient presents with  . Cyst   (Consider location/radiation/quality/duration/timing/severity/associated sxs/prior Treatment) HPI He is here today for evaluation of neck boil. He states this is a recurrent problem for the last year. It started about a week ago. He has been applying warm compresses, but has not seen any drainage. It is associated with neck pain. He states this is his typical presentation of the abscess. No fevers, chills, nausea, vomiting. He would like to avoid an I&D if possible. He has an appointment with her primary care physician for referral to general surgery for removal of the cyst.  Past Medical History  Diagnosis Date  . Abscess     recurrent infections  . Hypertension   . Obesity    History reviewed. No pertinent past surgical history. Family History  Problem Relation Age of Onset  . Cancer Father    History  Substance Use Topics  . Smoking status: Current Every Day Smoker -- 0.50 packs/day    Types: Cigarettes  . Smokeless tobacco: Not on file  . Alcohol Use: 1.2 oz/week    2 Cans of beer per week     Comment: on weekends    Review of Systems  Constitutional: Negative.   Skin: Positive for wound.       boil    Allergies  Review of patient's allergies indicates no known allergies.  Home Medications   Prior to Admission medications   Medication Sig Start Date End Date Taking? Authorizing Provider  oxyCODONE-acetaminophen (PERCOCET/ROXICET) 5-325 MG per tablet Take 2 tablets by mouth every 4 (four) hours as needed for severe pain. 05/13/14   Charm RingsErin J Emaad Nanna, MD  sulfamethoxazole-trimethoprim (BACTRIM DS) 800-160 MG per tablet Take 1 tablet by mouth 2 (two) times daily. 05/13/14   Charm RingsErin J Kiyon Fidalgo, MD   BP 134/95  Pulse 120  Temp(Src) 97.5 F (36.4 C) (Oral)  SpO2 96% Physical Exam  Constitutional: He appears  well-developed and well-nourished. No distress.  Cardiovascular: Tachycardia present.   Skin:  Right leg: wound healing by secondary intention; no erythema or purulent drainage. Neck: 4x2cm area of induration across back of neck, small area of fluctuance, watery purulent drainage from opening in prior I&D incision    ED Course  Procedures (including critical care time) Labs Review Labs Reviewed - No data to display  Imaging Review No results found.   MDM   1. Recurrent boils    He is tachycardic today. However it does not have any chest pain. On review of chart his pulse is typically between 100-120.  Given patient preference and that it is starting to drain on its own, will not perform I&D today. Bactrim twice a day x10 days. Percocet #20 for pain. Continue warm compresses. Followup in 3 days if it is not improving. Definitive therapy will require referral to general surgery for removal of cyst. He is working on this.    Charm RingsErin J Mitzy Naron, MD 05/13/14 [id number]

## 2014-05-13 NOTE — ED Notes (Signed)
C/o neck boil onset 4 -7 days ago and has gotten prog. Bigger and more painful.  He usually get Keflex and Percocet for his boils.  Has applied warm compress and wants to know if it needs drained yet. Hx. frequent boils.  He cut his R lower leg 2 mos. Ago and did not get it sutured.  Wants to know if it is infected.  Occasionally drains pus when it scabs up.  It has gotten smaller.

## 2014-05-13 NOTE — Discharge Instructions (Signed)
Start the Bactrim 1 pill twice a day for 10 days. Continue to apply warm compresses several times a day.  If it is not improving by Friday, please come back.  It will likely need to be drained at that time.

## 2014-05-17 ENCOUNTER — Emergency Department (HOSPITAL_COMMUNITY)
Admission: EM | Admit: 2014-05-17 | Discharge: 2014-05-17 | Discharge status: Absent without leave | Payer: No Typology Code available for payment source | Source: Home / Self Care

## 2014-05-26 ENCOUNTER — Emergency Department (HOSPITAL_COMMUNITY)
Admission: EM | Admit: 2014-05-26 | Discharge: 2014-05-26 | Disposition: A | Discharge status: Home / Self Care | Payer: No Typology Code available for payment source | Source: Home / Self Care | Attending: Family Medicine | Admitting: Family Medicine

## 2014-05-26 ENCOUNTER — Encounter (HOSPITAL_COMMUNITY): Payer: Self-pay | Admitting: Emergency Medicine

## 2014-05-26 DIAGNOSIS — L0390 Cellulitis, unspecified: Secondary | ICD-10-CM

## 2014-05-26 DIAGNOSIS — R000 Tachycardia, unspecified: Secondary | ICD-10-CM

## 2014-05-26 DIAGNOSIS — L0291 Cutaneous abscess, unspecified: Secondary | ICD-10-CM

## 2014-05-26 MED ORDER — DOXYCYCLINE HYCLATE 100 MG PO CAPS: 100.0000 mg | ORAL_CAPSULE | Freq: Two times a day (BID) | ORAL | Status: DC

## 2014-05-26 MED ORDER — OXYCODONE-ACETAMINOPHEN 5-325 MG PO TABS: 2.0000 | ORAL_TABLET | ORAL | Status: DC | PRN

## 2014-05-26 NOTE — ED Provider Notes (Signed)
Glenn Murphy is a 38 y.o. male who presents to Urgent Care today for abscess. Patient has a 3 to four-week history of abscess of the posterior neck. He was seen here about 10 days ago for a draining abscess. At that time a joint decision was made to not perform incision and drainage as it was already draining. He was given Bactrim antibiotics. He's completed a course of antibiotics and notes continued pain and swelling. It has worsened recently. He denies any fevers or chills nausea vomiting or diarrhea. He notes that he typically has an elevated heart rate and denies any chest pain or palpitations. He is working to establish with a primary care Dr.   Past Medical History  Diagnosis Date  . Abscess     recurrent infections  . Hypertension   . Obesity    History  Substance Use Topics  . Smoking status: Current Every Day Smoker -- 0.50 packs/day    Types: Cigarettes  . Smokeless tobacco: Not on file  . Alcohol Use: 1.2 oz/week    2 Cans of beer per week     Comment: on weekends   ROS as above Medications: No current facility-administered medications for this encounter.   Current Outpatient Prescriptions  Medication Sig Dispense Refill  . doxycycline (VIBRAMYCIN) 100 MG capsule Take 1 capsule (100 mg total) by mouth 2 (two) times daily.  14 capsule  0  . oxyCODONE-acetaminophen (PERCOCET/ROXICET) 5-325 MG per tablet Take 2 tablets by mouth every 4 (four) hours as needed for severe pain.  10 tablet  0    Exam:  BP 156/106  Pulse 120  Temp(Src) 99.2 F (37.3 C) (Oral)  Resp 18  SpO2 100% Gen: Well NAD morbidly obese Skin of the posterior neck: large 5 x 3 cm fluctuant abscess. Tender to touch.  Incision and drainage of abscess:  Consent obtained and timeout performed Area cleaned with alcohol Cold spray applied and 5 mL of lidocaine with epinephrine injected to achieve a good anesthesia. A large 1 cm incision was made to the fluctuance. A large amount of pus was  expressed and cultured.  The incision was enlarged and turned into a circle. Blunt dissection was used to break up loculations. The wound is packed with 10 inches of 1/2 inch packing material. Patient tolerated the procedure well  No results found for this or any previous visit (from the past 24 hour(s)). No results found.  Assessment and Plan: 38 y.o. male with abscess. Heart abscess of the posterior neck. Treatment with doxycycline. Culture pending. Please see instructions for abscess care.  Discussed warning signs or symptoms. Please see discharge instructions. Patient expresses understanding.   This note was created using Conservation officer, historic buildings. Any transcription errors are unintended.    Rodolph Bong, MD 05/26/14 [phone]

## 2014-05-26 NOTE — ED Notes (Signed)
Pt reports abscess back of neck/scalp onset 3-4 weeks Seen here  On 8/4; given antibiotics Getting bigger and painful No signs of acute distress.

## 2014-05-26 NOTE — Discharge Instructions (Signed)
Thank you for coming in today. Remove the packing in about 3 days. Come back as needed. Followup with your primary care Dr. About your heart rate Call or go to the emergency room if you get worse, have trouble breathing, have chest pains, or palpitations.     Abscess Care After An abscess (also called a boil or furuncle) is an infected area that contains a collection of pus. Signs and symptoms of an abscess include pain, tenderness, redness, or hardness, or you may feel a moveable soft area under your skin. An abscess can occur anywhere in the body. The infection may spread to surrounding tissues causing cellulitis. A cut (incision) by the surgeon was made over your abscess and the pus was drained out. Gauze may have been packed into the space to provide a drain that will allow the cavity to heal from the inside outwards. The boil may be painful for 5 to 7 days. Most people with a boil do not have high fevers. Your abscess, if seen early, may not have localized, and may not have been lanced. If not, another appointment may be required for this if it does not get better on its own or with medications. HOME CARE INSTRUCTIONS   Only take over-the-counter or prescription medicines for pain, discomfort, or fever as directed by your caregiver.  When you bathe, soak and then remove gauze or iodoform packs at least daily or as directed by your caregiver. You may then wash the wound gently with mild soapy water. Repack with gauze or do as your caregiver directs. SEEK IMMEDIATE MEDICAL CARE IF:   You develop increased pain, swelling, redness, drainage, or bleeding in the wound site.  You develop signs of generalized infection including muscle aches, chills, fever, or a general ill feeling.  An oral temperature above 102 F (38.9 C) develops, not controlled by medication. See your caregiver for a recheck if you develop any of the symptoms described above. If medications (antibiotics) were prescribed,  take them as directed. Document Released: 04/14/2005 Document Revised: 12/19/2011 Document Reviewed: 12/10/2007 [facility] Patient Information 2015 [place]. This information is not intended to replace advice given to you by your health care provider. Make sure you discuss any questions you have with your health care provider.

## 2014-05-30 LAB — CULTURE, ROUTINE-ABSCESS: SPECIAL REQUESTS: NORMAL

## 2014-06-01 NOTE — ED Notes (Addendum)
Abscess culture posterior neck:  Rare Diphtheroids (corynebacterium species).  Pt. adequately treated with Doxycycline. Vassie Moselle 06/01/2014 No further action needed per Dr. Denyse Amass. 06/04/2014

## 2015-05-07 ENCOUNTER — Emergency Department (HOSPITAL_COMMUNITY)
Admission: EM | Admit: 2015-05-07 | Discharge: 2015-05-07 | Disposition: A | Discharge status: Home / Self Care | Payer: Self-pay

## 2015-05-07 ENCOUNTER — Encounter (HOSPITAL_COMMUNITY): Payer: Self-pay | Admitting: Emergency Medicine

## 2015-05-07 NOTE — ED Notes (Signed)
Pt states "I don't want to be seen, the boil popped." Pt ambulates out without distress. Alert x4 respirations easy non labored.

## 2015-05-07 NOTE — ED Notes (Signed)
Pt. Stated, I have about 4 boils they come up about every 2 weeks.  Glenn Murphy been dealing with this all my life.

## 2015-05-27 ENCOUNTER — Emergency Department (HOSPITAL_COMMUNITY)
Admission: EM | Admit: 2015-05-27 | Discharge: 2015-05-27 | Disposition: A | Discharge status: Home / Self Care | Payer: Self-pay | Source: Home / Self Care | Attending: Family Medicine | Admitting: Family Medicine

## 2015-05-27 ENCOUNTER — Encounter (HOSPITAL_COMMUNITY): Payer: Self-pay | Admitting: *Deleted

## 2015-05-27 DIAGNOSIS — L02411 Cutaneous abscess of right axilla: Secondary | ICD-10-CM

## 2015-05-27 DIAGNOSIS — M109 Gout, unspecified: Secondary | ICD-10-CM

## 2015-05-27 DIAGNOSIS — M1000 Idiopathic gout, unspecified site: Secondary | ICD-10-CM

## 2015-05-27 LAB — URIC ACID: Uric Acid, Serum: 8.7 mg/dL — ABNORMAL HIGH (ref 4.4–7.6)

## 2015-05-27 MED ORDER — COLCHICINE 0.6 MG PO TABS: 0.6000 mg | ORAL_TABLET | Freq: Two times a day (BID) | ORAL | Status: DC

## 2015-05-27 MED ORDER — COLCHICINE 0.6 MG PO TABS
0.6000 mg | ORAL_TABLET | Freq: Two times a day (BID) | ORAL | Status: DC
Start: 2015-05-27 — End: 2016-01-15

## 2015-05-27 MED ORDER — NAPROXEN 500 MG PO TABS: 500.0000 mg | ORAL_TABLET | Freq: Two times a day (BID) | ORAL | Status: DC

## 2015-05-27 NOTE — Discharge Instructions (Signed)
Take medicine as prescribed, see your doctors as planned

## 2015-05-27 NOTE — ED Notes (Addendum)
Pt   Has   Multiple  Boils         Neck  Back      And  Under  Arm     -     Has  Had  These  Symptoms    Before  Pt  Has been treated  By  Dr  August Saucer  With  Anti  Biotics   And  Pain meds         Pt  Has  A history  Of  Hypertension   Pt  Also  Reports  Pain  r  Big  Toe  With  swelling

## 2015-05-27 NOTE — ED Provider Notes (Signed)
CSN: [id number]     Arrival date & time 05/27/15  1805 History   First MD Initiated Contact with Patient 05/27/15 1845     Chief Complaint  Patient presents with  . Abscess   (Consider location/radiation/quality/duration/timing/severity/associated sxs/prior Treatment) Patient is a 39 y.o. male presenting with abscess and lower extremity pain. The history is provided by the patient.  Abscess Location:  Shoulder/arm and head/neck Head/neck abscess location:  L neck Shoulder/arm abscess location:  R axilla Abscess quality: fluctuance, painful and warmth   Red streaking: no   Progression:  Worsening Pain details:    Severity:  Moderate   Duration:  1 week   Progression:  Worsening Chronicity:  Chronic Ineffective treatments:  Oral antibiotics Associated symptoms: no fever   Risk factors: prior abscess   Foot Pain This is a new problem. The current episode started more than 2 days ago. The problem has been gradually worsening. Associated symptoms comments: Right gt toe sts and pain . The symptoms are aggravated by walking.    Past Medical History  Diagnosis Date  . Abscess     recurrent infections  . Hypertension   . Obesity    History reviewed. No pertinent past surgical history. Family History  Problem Relation Age of Onset  . Cancer Father    Social History  Substance Use Topics  . Smoking status: Current Every Day Smoker -- 0.50 packs/day    Types: Cigarettes  . Smokeless tobacco: None  . Alcohol Use: 1.2 oz/week    2 Cans of beer per week     Comment: on weekends    Review of Systems  Constitutional: Negative for fever.  Musculoskeletal: Positive for gait problem.  Skin: Positive for rash.    Allergies  Review of patient's allergies indicates no known allergies.  Home Medications   Prior to Admission medications   Medication Sig Start Date End Date Taking? Authorizing Provider  colchicine 0.6 MG tablet Take 1 tablet (0.6 mg total) by mouth 2 (two) times  daily. 05/27/15   Linna Hoff, MD  doxycycline (VIBRAMYCIN) 100 MG capsule Take 1 capsule (100 mg total) by mouth 2 (two) times daily. 05/26/14   Rodolph Bong, MD  naproxen (NAPROSYN) 500 MG tablet Take 1 tablet (500 mg total) by mouth 2 (two) times daily with a meal. 05/27/15   Linna Hoff, MD  oxyCODONE-acetaminophen (PERCOCET/ROXICET) 5-325 MG per tablet Take 2 tablets by mouth every 4 (four) hours as needed for severe pain. 05/26/14   Rodolph Bong, MD   BP 141/69 mmHg  Pulse 91  Temp(Src) 98.5 F (36.9 C) (Oral)  Resp 16  SpO2 100% Physical Exam  Constitutional: He is oriented to person, place, and time. He appears well-developed and well-nourished.  Musculoskeletal: He exhibits tenderness.  Neurological: He is alert and oriented to person, place, and time.  Skin: Rash noted.  Nursing note and vitals reviewed.   ED Course  INCISION AND DRAINAGE Date/Time: 05/27/2015 7:22 PM Performed by: Bradd Canary D Authorized by: Bradd Canary D Consent: Verbal consent obtained. Consent given by: patient Type: abscess Body area: upper extremity Location details: right arm Local anesthetic: topical anesthetic Patient sedated: no Scalpel size: 11 Incision type: single straight Complexity: simple Drainage: purulent Drainage amount: copious Wound treatment: wound left open Patient tolerance: Patient tolerated the procedure well with no immediate complications   (including critical care time) Labs Review Labs Reviewed  URIC ACID    Imaging Review No results found.  MDM   1. Gout of big toe   2. Abscess of axilla, right        Linna Hoff, MD 05/27/15 [phone]

## 2015-05-28 NOTE — ED Notes (Signed)
Called patient to discuss uric acid report that suggests gout . Advised to fill colchicine Rx if he has not already, and follow up w his PCP for dietary management of future gout

## 2015-05-29 NOTE — ED Notes (Signed)
Patient called to inquire about more affordable Rx , as the colchicine is $300 . Discussed w Dr Artis Flock, who authorized alopurinol 300 mg, po , qd x 30 pills. Called to [facility], spoke to pharmacist, and have relayed their message that it will be ready in 10-15 min

## 2015-12-28 ENCOUNTER — Ambulatory Visit: Payer: Self-pay | Admitting: Surgery

## 2015-12-28 NOTE — H&P (Signed)
History of Present Illness Glenn Murphy(Kayly Kriegel K. Annaka Cleaver MD; 12/28/2015 12:16 PM) Patient words: neck cyst.  The patient is a 40 year old male who presents with a complaint of Mass. Referred by Dr. Gean MaidensWhitworth [facility] for multiple cysts  This is a 40 year old male with morbid obesity who presents with long history of multiple skin issues. He has chronic infections that appear in his axilla and in his posterior neck. These tend to chronically drain purulent fluid. He has also had previous infected sebaceous cysts of the upper back as well as his posterior left shoulder. He has also developed 2 sebaceous cyst on his forehead just above his eyebrows. He would like to have the chronically infected areas of his axilla and posterior neck excised. He would also like to have the cyst removed before they recur or become infected.   Other Problems Gilmer Mor(Sonya Bynum, CMA; 12/28/2015 10:21 AM) Arthritis  Past Surgical History Gilmer Mor(Sonya Bynum, CMA; 12/28/2015 10:21 AM) No pertinent past surgical history  Diagnostic Studies History Gilmer Mor(Sonya Bynum, CMA; 12/28/2015 10:21 AM) Colonoscopy never  Allergies (Sonya Bynum, CMA; 12/28/2015 10:22 AM) No Known Drug Allergies 12/28/2015  Medication History (Sonya Bynum, CMA; 12/28/2015 10:22 AM) LORazepam (1MG  Tablet, Oral) Active. Oxycodone-Acetaminophen (10-325MG  Tablet, Oral) Active. Allopurinol (300MG  Tablet, Oral) Active. Clindamycin HCl (300MG  Capsule, Oral) Active. Gabapentin (400MG  Capsule, Oral) Active. MetFORMIN HCl (1000MG  Tablet, Oral) Active. Metoprolol Succinate ER (25MG  Tablet ER 24HR, Oral) Active. Naproxen (500MG  Tablet, Oral) Active. RifAMPin (300MG  Capsule, Oral) Active. Triamcinolone Acetonide (0.1% Cream, External) Active. Triamterene-HCTZ (37.5-25MG  Tablet, Oral) Active. Medications Reconciled  Social History Gilmer Mor(Sonya Bynum, CMA; 12/28/2015 10:21 AM) Alcohol use Moderate alcohol use. No caffeine use Tobacco use Current every day  smoker.  Family History Gilmer Mor(Sonya Bynum, CMA; 12/28/2015 10:21 AM) Alcohol Abuse Father. Hypertension Mother.     Review of Systems Lamar Laundry(Sonya Bynum CMA; 12/28/2015 10:21 AM) General Present- Night Sweats. Not Present- Appetite Loss, Chills, Fatigue, Fever, Weight Gain and Weight Loss. Skin Present- Non-Healing Wounds. Not Present- Change in Wart/Mole, Dryness, Hives, Jaundice, New Lesions, Rash and Ulcer. HEENT Not Present- Earache, Hearing Loss, Hoarseness, Nose Bleed, Oral Ulcers, Ringing in the Ears, Seasonal Allergies, Sinus Pain, Sore Throat, Visual Disturbances, Wears glasses/contact lenses and Yellow Eyes. Respiratory Not Present- Bloody sputum, Chronic Cough, Difficulty Breathing, Snoring and Wheezing. Breast Not Present- Breast Mass, Breast Pain, Nipple Discharge and Skin Changes. Cardiovascular Not Present- Chest Pain, Difficulty Breathing Lying Down, Leg Cramps, Palpitations, Rapid Heart Rate, Shortness of Breath and Swelling of Extremities. Gastrointestinal Not Present- Abdominal Pain, Bloating, Bloody Stool, Change in Bowel Habits, Chronic diarrhea, Constipation, Difficulty Swallowing, Excessive gas, Gets full quickly at meals, Hemorrhoids, Indigestion, Nausea, Rectal Pain and Vomiting. Male Genitourinary Not Present- Blood in Urine, Change in Urinary Stream, Frequency, Impotence, Nocturia, Painful Urination, Urgency and Urine Leakage. Musculoskeletal Not Present- Back Pain, Joint Pain, Joint Stiffness, Muscle Pain, Muscle Weakness and Swelling of Extremities. Neurological Not Present- Decreased Memory, Fainting, Headaches, Numbness, Seizures, Tingling, Tremor, Trouble walking and Weakness. Psychiatric Not Present- Anxiety, Bipolar, Change in Sleep Pattern, Depression, Fearful and Frequent crying. Hematology Not Present- Easy Bruising, Excessive bleeding, Gland problems, HIV and Persistent Infections.  Vitals (Sonya Bynum CMA; 12/28/2015 10:21 AM) 12/28/2015 10:21 AM Weight: 341 lb  Height: 68in Body Surface Area: 2.56 m Body Mass Index: 51.85 kg/m  Temp.: 98.26F(Temporal)  Pulse: 98 (Regular)  BP: 144/84 (Sitting, Left Arm, Standard)      Physical Exam Glenn Murphy(Aliviyah Malanga K. Carmalita Wakefield MD; 12/28/2015 12:20 PM)  The physical exam findings are as follows: Note:Obese  male in NAD Skin - forehead just above eyebrows - two 1 cm subcutaneous firm masses; no sign of infection Right axilla - long 4 cm area of thickening with some openings; no current drainage Posterior neck 2 x 5 cm area of chronic scarring with some purulent drainage; Mid-upper back - 3 cm firm subcutaneous cyst Posterior left shoulder 2 cm firm subcutaneous cyst    Assessment & Plan Glenn Murphy K. Darria Corvera MD; 12/28/2015 10:52 AM)  INFECTED SEBACEOUS CYST (L72.3) Impression: Mid - upper back - 3 cm Posterior left shoulder 2 cm Forehead - 1 cm x two  HIDRADENITIS AXILLARIS (L73.2) Impression: Right axilla 4 cm  HIDRADENITIS (L73.2) Impression: posterior neck 2 x 5 cm  Current Plans Schedule for Surgery - Excision of sebaceous cysts and hidradenitis - right axilla, forehead x 2, left posterior shoulder, mid-upper back, and posterior neck. The surgical procedure has been discussed with the patient. Potential risks, benefits, alternative treatments, and expected outcomes have been explained. All of the patient's questions at this time have been answered. The likelihood of reaching the patient's treatment goal is good. The patient understand the proposed surgical procedure and wishes to proceed.  Glenn Murphy. Corliss Skains, MD, [facility] Surgery  General/ Trauma Surgery  12/28/2015 12:21 PM

## 2016-01-05 NOTE — Progress Notes (Signed)
Patient's BMI 51.8 exceeds our guidelines of BMI 50. Steward DroneBrenda at [facility] notified of need to move patient to Main OR.

## 2016-01-19 ENCOUNTER — Inpatient Hospital Stay (HOSPITAL_COMMUNITY)
Admission: RE | Admit: 2016-01-19 | Discharge: 2016-01-19 | Disposition: A | Discharge status: Home / Self Care | Payer: Self-pay | Source: Ambulatory Visit

## 2016-01-26 ENCOUNTER — Encounter (HOSPITAL_COMMUNITY)
Admission: RE | Admit: 2016-01-26 | Discharge: 2016-01-26 | Disposition: A | Discharge status: Home / Self Care | Payer: Self-pay | Source: Ambulatory Visit | Attending: Surgery | Admitting: Surgery

## 2016-01-26 ENCOUNTER — Encounter (HOSPITAL_COMMUNITY): Payer: Self-pay

## 2016-01-26 DIAGNOSIS — L732 Hidradenitis suppurativa: Secondary | ICD-10-CM | POA: Insufficient documentation

## 2016-01-26 DIAGNOSIS — Z01818 Encounter for other preprocedural examination: Secondary | ICD-10-CM | POA: Insufficient documentation

## 2016-01-26 DIAGNOSIS — L723 Sebaceous cyst: Secondary | ICD-10-CM | POA: Insufficient documentation

## 2016-01-26 DIAGNOSIS — Z01812 Encounter for preprocedural laboratory examination: Secondary | ICD-10-CM | POA: Insufficient documentation

## 2016-01-26 HISTORY — DX: Unspecified osteoarthritis, unspecified site: M19.90

## 2016-01-26 HISTORY — DX: Type 2 diabetes mellitus without complications: E11.9

## 2016-01-26 LAB — CBC
HEMATOCRIT: 44.2 % (ref 39.0–52.0)
HEMOGLOBIN: 14.9 g/dL (ref 13.0–17.0)
MCH: 32.5 pg (ref 26.0–34.0)
MCHC: 33.7 g/dL (ref 30.0–36.0)
MCV: 96.3 fL (ref 78.0–100.0)
Platelets: 241 10*3/uL (ref 150–400)
RBC: 4.59 MIL/uL (ref 4.22–5.81)
RDW: 12.9 % (ref 11.5–15.5)
WBC: 6.9 10*3/uL (ref 4.0–10.5)

## 2016-01-26 LAB — COMPREHENSIVE METABOLIC PANEL
ALBUMIN: 4.1 g/dL (ref 3.5–5.0)
ALT: 80 U/L — ABNORMAL HIGH (ref 17–63)
ANION GAP: 12 (ref 5–15)
AST: 95 U/L — ABNORMAL HIGH (ref 15–41)
Alkaline Phosphatase: 80 U/L (ref 38–126)
BILIRUBIN TOTAL: 0.5 mg/dL (ref 0.3–1.2)
BUN: 13 mg/dL (ref 6–20)
CO2: 19 mmol/L — ABNORMAL LOW (ref 22–32)
Calcium: 9.4 mg/dL (ref 8.9–10.3)
Chloride: 102 mmol/L (ref 101–111)
Creatinine, Ser: 0.93 mg/dL (ref 0.61–1.24)
GFR calc non Af Amer: >60 mL/min (ref >60)
GLUCOSE: 100 mg/dL — AB (ref 65–99)
POTASSIUM: 4.4 mmol/L (ref 3.5–5.1)
Sodium: 133 mmol/L — ABNORMAL LOW (ref 135–145)
TOTAL PROTEIN: 7.7 g/dL (ref 6.5–8.1)

## 2016-01-26 LAB — GLUCOSE, CAPILLARY: Glucose-Capillary: 112 mg/dL — ABNORMAL HIGH (ref 65–99)

## 2016-01-26 NOTE — Pre-Procedure Instructions (Addendum)
Glenn BollmanDavid M Murphy  01/26/2016      [facility] [address]- [place], [place] - [address] [name] [id number] Phone: [phone] Fax: [phone]    Your procedure is scheduled on  Thursday  01/28/16  [facility] at 715 A.M.  Call this number if you have problems the morning of surgery:  [phone]   Remember:  Do not eat food or drink liquids after midnight.  Take these medicines the morning of surgery with A SIP OF WATER   ALLOPURINOL,cleocin, GABAPENTIN, METOPROLOL (TOPROL), RIFAMPIN, OXYCODONE    STOP all herbel meds, nsaids (aleve,naproxen,advil,ibuprofen) today including aspirin, goodys, vitamins   No metformin day of surgery   How to Manage Your Diabetes Before and After Surgery  Why is it important to control my blood sugar before and after surgery? . Improving blood sugar levels before and after surgery helps healing and can limit problems. . A way of improving blood sugar control is eating a healthy diet by: o  Eating less sugar and carbohydrates o  Increasing activity/exercise o  Talking with your doctor about reaching your blood sugar goals . High blood sugars (greater than 180 mg/dL) can raise your risk of infections and slow your recovery, so you will need to focus on controlling your diabetes during the weeks before surgery. . Make sure that the doctor who takes care of your diabetes knows about your planned surgery including the date and location.  How do I manage my blood sugar before surgery? . Check your blood sugar at least 4 times a day, starting 2 days before surgery, to make sure that the level is not too high or low. o Check your blood sugar the morning of your surgery when you wake up and every 2 hours until you get to the Short Stay unit. . If your blood sugar is less than 70 mg/dL, you will need to treat for low blood sugar: o Do not take insulin. o Treat a low blood sugar  (less than 70 mg/dL) with  cup of clear juice (cranberry or apple), 4 glucose tablets, OR glucose gel. o Recheck blood sugar in 15 minutes after treatment (to make sure it is greater than 70 mg/dL). If your blood sugar is not greater than 70 mg/dL on recheck, call [phone] for further instructions. . Report your blood sugar to the short stay nurse when you get to Short Stay.  . If you are admitted to the hospital after surgery: o Your blood sugar will be checked by the staff and you will probably be given insulin after surgery (instead of oral diabetes medicines) to make sure you have good blood sugar levels. o The goal for blood sugar control after surgery is 80-180 mg/dL.    WHAT DO I DO ABOUT MY DIABETES MEDICATION?   [name]. Do not take oral diabetes medicines (pills) the morning of surgery.(metformin)  Reviewed and Endorsed by [facility] Patient Education Committee, August 2015   Do not wear jewelry, make-up or nail polish.  Do not wear lotions, powders, or perfumes.  You may wear deodorant.  Do not shave 48 hours prior to surgery.  Men may shave face and neck.  Do not bring valuables to the hospital.  [facility] is not responsible for any belongings or valuables.  Contacts, dentures or bridgework may not be worn into surgery.  Leave your suitcase in the car.  After surgery it may be brought to  your room.  For patients admitted to the hospital, discharge time will be determined by your treatment team.  Patients discharged the day of surgery will not be allowed to drive home.   Name and phone number of your driver:    Special instructions:  Special Instructions: [facility] - Preparing for Surgery  Before surgery, you can play an important role.  Because skin is not sterile, your skin needs to be as free of germs as possible.  You can reduce the number of germs on you skin by washing with CHG (chlorahexidine gluconate) soap before surgery.  CHG is an antiseptic cleaner which kills  germs and bonds with the skin to continue killing germs even after washing.  Please DO NOT use if you have an allergy to CHG or antibacterial soaps.  If your skin becomes reddened/irritated stop using the CHG and inform your nurse when you arrive at Short Stay.  Do not shave (including legs and underarms) for at least 48 hours prior to the first CHG shower.  You may shave your face.  Please follow these instructions carefully:   1.  Shower with CHG Soap the night before surgery and the morning of Surgery.  2.  If you choose to wash your hair, wash your hair first as usual with your normal shampoo.  3.  After you shampoo, rinse your hair and body thoroughly to remove the Shampoo.  4.  Use CHG as you would any other liquid soap.  You can apply chg directly  to the skin and wash gently with scrungie or a clean washcloth.  5.  Apply the CHG Soap to your body ONLY FROM THE NECK DOWN.  Do not use on open wounds or open sores.  Avoid contact with your eyes ears, mouth and genitals (private parts).  Wash genitals (private parts)       with your normal soap.  6.  Wash thoroughly, paying special attention to the area where your surgery will be performed.  7.  Thoroughly rinse your body with warm water from the neck down.  8.  DO NOT shower/wash with your normal soap after using and rinsing off the CHG Soap.  9.  Pat yourself dry with a clean towel.            10.  Wear clean pajamas.            11.  Place clean sheets on your bed the night of your first shower and do not sleep with pets.  Day of Surgery  Do not apply any lotions/deodorants the morning of surgery.  Please wear clean clothes to the hospital/surgery center.  Please read over the following fact sheets that you were given. Pain Booklet, Coughing and Deep Breathing and Surgical Site Infection Prevention

## 2016-01-27 LAB — HEMOGLOBIN A1C
HEMOGLOBIN A1C: 5.9 % — AB (ref 4.8–5.6)
MEAN PLASMA GLUCOSE: 123 mg/dL

## 2016-01-27 MED ORDER — DEXTROSE 5 % IV SOLN
3.0000 g | INTRAVENOUS | Status: AC
  Administered 2016-01-28: 3 g via INTRAVENOUS
  Filled 2016-01-27: qty 30

## 2016-01-27 MED ORDER — DEXTROSE 5 % IV SOLN: 3.0000 g | INTRAVENOUS | Status: DC

## 2016-01-27 NOTE — Progress Notes (Signed)
Anesthesia Chart Review:  Pt is a 40 year old male scheduled for hidradenitis excision R axilla and posterior neck, sebaceous cyst excision forehead x2, sebaceous cyst excision L shoulder and mid-back on 01/28/2016 with Dr. Corliss Skainssuei.   PCP is Dr. Willey BladeEric Dean.   PMH includes:  HTN, DM, recurrent abscesses. Heavy alcohol abuse (40 oz beer a day and a fifth of liquor over 3-4 days). Uses marijuana. Current smoker. BMI 50.   Medications include: clindamycin, metformin, metoprolol, rifampin, triamterene-hctz.   Preoperative labs reviewed.   - HgbA1c 5.9, glucose 100.  - AST 95, ALT 80. Only prior results available for comparison are from 2014 (AST 91, ALT 69).  - Will obtain PT and PTT DOS  EKG 01/26/16: NSR.   Reviewed case with Dr. Glade Stanford. Fitzgerald.   If labs acceptable DOS, I anticipate pt can proceed with surgery as scheduled.   Rica Mastngela Felesia Stahlecker, FNP-BC [facility]/Anesthesiology Phone: [phone] 01/27/2016 10:21 AM

## 2016-01-28 ENCOUNTER — Ambulatory Visit (HOSPITAL_BASED_OUTPATIENT_CLINIC_OR_DEPARTMENT_OTHER)
Admission: RE | Admit: 2016-01-28 | Discharge: 2016-01-28 | Disposition: A | Discharge status: Home / Self Care | Payer: Self-pay | Source: Ambulatory Visit | Attending: Surgery | Admitting: Surgery

## 2016-01-28 ENCOUNTER — Encounter (HOSPITAL_COMMUNITY)
Admission: RE | Disposition: A | Discharge status: Home / Self Care | Payer: Self-pay | Source: Ambulatory Visit | Attending: Surgery

## 2016-01-28 ENCOUNTER — Encounter (HOSPITAL_BASED_OUTPATIENT_CLINIC_OR_DEPARTMENT_OTHER): Payer: Self-pay | Admitting: Anesthesiology

## 2016-01-28 ENCOUNTER — Ambulatory Visit (HOSPITAL_COMMUNITY): Payer: Self-pay | Admitting: Emergency Medicine

## 2016-01-28 ENCOUNTER — Ambulatory Visit (HOSPITAL_COMMUNITY): Payer: Self-pay | Admitting: Anesthesiology

## 2016-01-28 DIAGNOSIS — F17200 Nicotine dependence, unspecified, uncomplicated: Secondary | ICD-10-CM | POA: Insufficient documentation

## 2016-01-28 DIAGNOSIS — M1990 Unspecified osteoarthritis, unspecified site: Secondary | ICD-10-CM | POA: Insufficient documentation

## 2016-01-28 DIAGNOSIS — L732 Hidradenitis suppurativa: Secondary | ICD-10-CM | POA: Insufficient documentation

## 2016-01-28 DIAGNOSIS — L720 Epidermal cyst: Secondary | ICD-10-CM | POA: Insufficient documentation

## 2016-01-28 DIAGNOSIS — Z79899 Other long term (current) drug therapy: Secondary | ICD-10-CM | POA: Insufficient documentation

## 2016-01-28 HISTORY — PX: CYST REMOVAL TRUNK: SHX6283

## 2016-01-28 HISTORY — PX: CYST EXCISION: SHX5701

## 2016-01-28 HISTORY — PX: HYDRADENITIS EXCISION: SHX5243

## 2016-01-28 LAB — APTT: APTT: 26 s (ref 24–37)

## 2016-01-28 LAB — PROTIME-INR
INR: 1.08 (ref 0.00–1.49)
Prothrombin Time: 14.2 seconds (ref 11.6–15.2)

## 2016-01-28 LAB — GLUCOSE, CAPILLARY
GLUCOSE-CAPILLARY: 144 mg/dL — AB (ref 65–99)
Glucose-Capillary: 154 mg/dL — ABNORMAL HIGH (ref 65–99)

## 2016-01-28 SURGERY — EXCISION, HIDRADENITIS, AXILLA
Anesthesia: General | Site: Neck | Laterality: Right

## 2016-01-28 MED ORDER — BUPIVACAINE-EPINEPHRINE (PF) 0.25% -1:200000 IJ SOLN
INTRAMUSCULAR | Status: AC
  Filled 2016-01-28: qty 30

## 2016-01-28 MED ORDER — 0.9 % SODIUM CHLORIDE (POUR BTL) OPTIME
TOPICAL | Status: DC | PRN
  Administered 2016-01-28: 1000 mL

## 2016-01-28 MED ORDER — HYDROMORPHONE HCL 2 MG PO TABS
ORAL_TABLET | ORAL | Status: AC
  Filled 2016-01-28: qty 1

## 2016-01-28 MED ORDER — FENTANYL CITRATE (PF) 250 MCG/5ML IJ SOLN
INTRAMUSCULAR | Status: AC
  Filled 2016-01-28: qty 5

## 2016-01-28 MED ORDER — ONDANSETRON HCL 4 MG/2ML IJ SOLN
INTRAMUSCULAR | Status: DC | PRN
  Administered 2016-01-28: 4 mg via INTRAVENOUS

## 2016-01-28 MED ORDER — HYDROMORPHONE HCL 2 MG PO TABS: 2.0000 mg | ORAL_TABLET | ORAL | Status: DC | PRN

## 2016-01-28 MED ORDER — PROPOFOL 10 MG/ML IV BOLUS
INTRAVENOUS | Status: AC
  Filled 2016-01-28: qty 20

## 2016-01-28 MED ORDER — PROPOFOL 10 MG/ML IV BOLUS
INTRAVENOUS | Status: DC | PRN
  Administered 2016-01-28: 200 mg via INTRAVENOUS

## 2016-01-28 MED ORDER — LIDOCAINE HCL (CARDIAC) 20 MG/ML IV SOLN
INTRAVENOUS | Status: AC
  Filled 2016-01-28: qty 5

## 2016-01-28 MED ORDER — ONDANSETRON HCL 4 MG/2ML IJ SOLN
INTRAMUSCULAR | Status: AC
  Filled 2016-01-28: qty 2

## 2016-01-28 MED ORDER — DEXAMETHASONE SODIUM PHOSPHATE 4 MG/ML IJ SOLN
INTRAMUSCULAR | Status: AC
  Filled 2016-01-28: qty 2

## 2016-01-28 MED ORDER — HYDROMORPHONE HCL 1 MG/ML IJ SOLN
0.2500 mg | INTRAMUSCULAR | Status: DC | PRN
  Administered 2016-01-28: 0.5 mg via INTRAVENOUS

## 2016-01-28 MED ORDER — NEOSTIGMINE METHYLSULFATE 10 MG/10ML IV SOLN
INTRAVENOUS | Status: DC | PRN
  Administered 2016-01-28: 3 mg via INTRAVENOUS

## 2016-01-28 MED ORDER — ONDANSETRON HCL 4 MG/2ML IJ SOLN: 4.0000 mg | INTRAMUSCULAR | Status: DC | PRN

## 2016-01-28 MED ORDER — ROCURONIUM BROMIDE 100 MG/10ML IV SOLN
INTRAVENOUS | Status: DC | PRN
  Administered 2016-01-28: 30 mg via INTRAVENOUS
  Administered 2016-01-28: 10 mg via INTRAVENOUS

## 2016-01-28 MED ORDER — LACTATED RINGERS IV SOLN
INTRAVENOUS | Status: DC
  Administered 2016-01-28 (×3): via INTRAVENOUS

## 2016-01-28 MED ORDER — HYDROMORPHONE HCL 1 MG/ML IJ SOLN: 1.0000 mg | INTRAMUSCULAR | Status: DC | PRN

## 2016-01-28 MED ORDER — PHENYLEPHRINE HCL 10 MG/ML IJ SOLN
INTRAMUSCULAR | Status: DC | PRN
  Administered 2016-01-28 (×6): 120 ug via INTRAVENOUS

## 2016-01-28 MED ORDER — FENTANYL CITRATE (PF) 100 MCG/2ML IJ SOLN
INTRAMUSCULAR | Status: DC | PRN
  Administered 2016-01-28 (×10): 50 ug via INTRAVENOUS

## 2016-01-28 MED ORDER — GLYCOPYRROLATE 0.2 MG/ML IJ SOLN
INTRAMUSCULAR | Status: DC | PRN
  Administered 2016-01-28: 0.4 mg via INTRAVENOUS

## 2016-01-28 MED ORDER — LIDOCAINE HCL (CARDIAC) 20 MG/ML IV SOLN
INTRAVENOUS | Status: DC | PRN
  Administered 2016-01-28: 80 mg via INTRAVENOUS

## 2016-01-28 MED ORDER — SUCCINYLCHOLINE CHLORIDE 20 MG/ML IJ SOLN
INTRAMUSCULAR | Status: DC | PRN
  Administered 2016-01-28: 120 mg via INTRAVENOUS

## 2016-01-28 MED ORDER — SUCCINYLCHOLINE CHLORIDE 20 MG/ML IJ SOLN
INTRAMUSCULAR | Status: AC
  Filled 2016-01-28: qty 2

## 2016-01-28 MED ORDER — MIDAZOLAM HCL 2 MG/2ML IJ SOLN
INTRAMUSCULAR | Status: AC
  Filled 2016-01-28: qty 2

## 2016-01-28 MED ORDER — ROCURONIUM BROMIDE 50 MG/5ML IV SOLN
INTRAVENOUS | Status: AC
  Filled 2016-01-28: qty 1

## 2016-01-28 MED ORDER — BUPIVACAINE-EPINEPHRINE 0.25% -1:200000 IJ SOLN
INTRAMUSCULAR | Status: DC | PRN
  Administered 2016-01-28: 10 mL
  Administered 2016-01-28: 6 mL

## 2016-01-28 MED ORDER — CHLORHEXIDINE GLUCONATE 4 % EX LIQD: 1.0000 "application " | Freq: Once | CUTANEOUS | Status: DC

## 2016-01-28 MED ORDER — HYDROMORPHONE HCL 2 MG PO TABS
2.0000 mg | ORAL_TABLET | ORAL | Status: DC | PRN
  Administered 2016-01-28: 2 mg via ORAL

## 2016-01-28 MED ORDER — MIDAZOLAM HCL 5 MG/5ML IJ SOLN
INTRAMUSCULAR | Status: DC | PRN
  Administered 2016-01-28: 2 mg via INTRAVENOUS

## 2016-01-28 MED ORDER — HYDROMORPHONE HCL 1 MG/ML IJ SOLN
INTRAMUSCULAR | Status: AC
  Administered 2016-01-28: 0.5 mg via INTRAVENOUS
  Filled 2016-01-28: qty 1

## 2016-01-28 SURGICAL SUPPLY — 70 items
ADH SKN CLS APL DERMABOND .7 (GAUZE/BANDAGES/DRESSINGS) ×8
APL SKNCLS STERI-STRIP NONHPOA (GAUZE/BANDAGES/DRESSINGS) ×8
APPLIER CLIP 9.375 MED OPEN (MISCELLANEOUS)
APR CLP MED 9.3 20 MLT OPN (MISCELLANEOUS)
BENZOIN TINCTURE PRP APPL 2/3 (GAUZE/BANDAGES/DRESSINGS) ×8 IMPLANT
BLADE SURG 15 STRL LF DISP TIS (BLADE) IMPLANT
BLADE SURG 15 STRL SS (BLADE) ×18
BLADE SURG ROTATE 9660 (MISCELLANEOUS) IMPLANT
CANISTER SUCTION 2500CC (MISCELLANEOUS) ×6 IMPLANT
CHLORAPREP W/TINT 26ML (MISCELLANEOUS) ×10 IMPLANT
CLEANER TIP ELECTROSURG 2X2 (MISCELLANEOUS) ×4 IMPLANT
CLIP APPLIE 9.375 MED OPEN (MISCELLANEOUS) ×4 IMPLANT
CLOSURE WOUND 1/2 X4 (GAUZE/BANDAGES/DRESSINGS) ×2
CONT SPEC 4OZ CLIKSEAL STRL BL (MISCELLANEOUS) ×8 IMPLANT
COVER PROBE W GEL 5X96 (DRAPES) IMPLANT
COVER SURGICAL LIGHT HANDLE (MISCELLANEOUS) ×6 IMPLANT
DECANTER SPIKE VIAL GLASS SM (MISCELLANEOUS) ×6 IMPLANT
DERMABOND ADVANCED (GAUZE/BANDAGES/DRESSINGS) ×4
DERMABOND ADVANCED .7 DNX12 (GAUZE/BANDAGES/DRESSINGS) IMPLANT
DRAIN CHANNEL 19F RND (DRAIN) ×8 IMPLANT
DRAPE LAPAROSCOPIC ABDOMINAL (DRAPES) ×8 IMPLANT
DRAPE LAPAROTOMY 100X72 PEDS (DRAPES) ×2 IMPLANT
DRAPE LAPAROTOMY T 98X78 PEDS (DRAPES) ×6 IMPLANT
DRAPE PROXIMA HALF (DRAPES) ×8 IMPLANT
DRAPE UTILITY XL STRL (DRAPES) ×14 IMPLANT
DRSG TEGADERM 2-3/8X2-3/4 SM (GAUZE/BANDAGES/DRESSINGS) ×4 IMPLANT
DRSG TEGADERM 4X4.75 (GAUZE/BANDAGES/DRESSINGS) ×4 IMPLANT
ELECT CAUTERY BLADE 6.4 (BLADE) ×6 IMPLANT
ELECT REM PT RETURN 9FT ADLT (ELECTROSURGICAL) ×6
ELECTRODE REM PT RTRN 9FT ADLT (ELECTROSURGICAL) ×4 IMPLANT
EVACUATOR SILICONE 100CC (DRAIN) ×8 IMPLANT
GAUZE SPONGE 2X2 8PLY STRL LF (GAUZE/BANDAGES/DRESSINGS) IMPLANT
GAUZE SPONGE 4X4 12PLY STRL (GAUZE/BANDAGES/DRESSINGS) ×8 IMPLANT
GLOVE BIO SURGEON STRL SZ7 (GLOVE) ×10 IMPLANT
GLOVE BIOGEL PI IND STRL 7.0 (GLOVE) IMPLANT
GLOVE BIOGEL PI IND STRL 7.5 (GLOVE) ×4 IMPLANT
GLOVE BIOGEL PI INDICATOR 7.0 (GLOVE) ×8
GLOVE BIOGEL PI INDICATOR 7.5 (GLOVE) ×4
GLOVE SURG SS PI 7.0 STRL IVOR (GLOVE) ×8 IMPLANT
GOWN STRL REUS W/ TWL LRG LVL3 (GOWN DISPOSABLE) ×8 IMPLANT
GOWN STRL REUS W/TWL LRG LVL3 (GOWN DISPOSABLE) ×36
KIT BASIN OR (CUSTOM PROCEDURE TRAY) ×6 IMPLANT
KIT ROOM TURNOVER OR (KITS) ×6 IMPLANT
NDL 18GX1X1/2 (RX/OR ONLY) (NEEDLE) IMPLANT
NDL HYPO 25GX1X1/2 BEV (NEEDLE) IMPLANT
NEEDLE 18GX1X1/2 (RX/OR ONLY) (NEEDLE) IMPLANT
NEEDLE HYPO 25GX1X1/2 BEV (NEEDLE) ×12 IMPLANT
NS IRRIG 1000ML POUR BTL (IV SOLUTION) ×6 IMPLANT
PACK GENERAL/GYN (CUSTOM PROCEDURE TRAY) ×6 IMPLANT
PACK SURGICAL SETUP 50X90 (CUSTOM PROCEDURE TRAY) ×4 IMPLANT
PAD ARMBOARD 7.5X6 YLW CONV (MISCELLANEOUS) ×12 IMPLANT
PENCIL BUTTON HOLSTER BLD 10FT (ELECTRODE) ×6 IMPLANT
SPECIMEN JAR LARGE (MISCELLANEOUS) ×6 IMPLANT
SPECIMEN JAR SMALL (MISCELLANEOUS) IMPLANT
SPONGE GAUZE 2X2 STER 10/PKG (GAUZE/BANDAGES/DRESSINGS) ×2
SPONGE LAP 18X18 X RAY DECT (DISPOSABLE) IMPLANT
SPONGE LAP 4X18 X RAY DECT (DISPOSABLE) ×6 IMPLANT
STAPLER VISISTAT 35W (STAPLE) ×6 IMPLANT
STRIP CLOSURE SKIN 1/2X4 (GAUZE/BANDAGES/DRESSINGS) ×6 IMPLANT
SUT ETHILON 3 0 FSL (SUTURE) ×8 IMPLANT
SUT MNCRL AB 4-0 PS2 18 (SUTURE) ×12 IMPLANT
SUT VIC AB 2-0 SH 27 (SUTURE)
SUT VIC AB 2-0 SH 27X BRD (SUTURE) IMPLANT
SUT VIC AB 3-0 SH 18 (SUTURE) ×6 IMPLANT
SUT VIC AB 3-0 SH 27 (SUTURE) ×30
SUT VIC AB 3-0 SH 27XBRD (SUTURE) ×4 IMPLANT
SYR CONTROL 10ML LL (SYRINGE) IMPLANT
TOWEL OR 17X24 6PK STRL BLUE (TOWEL DISPOSABLE) ×6 IMPLANT
TOWEL OR 17X26 10 PK STRL BLUE (TOWEL DISPOSABLE) ×6 IMPLANT
WATER STERILE IRR 1000ML POUR (IV SOLUTION) ×6 IMPLANT

## 2016-01-28 NOTE — Anesthesia Postprocedure Evaluation (Signed)
Anesthesia Post Note  Patient: Glenn BollmanDavid M Hermans  Procedure(s) Performed: Procedure(s) (LRB): EXCISION HIDRADENITIS RIGHT AXILLA  (Right) EXCISION SEBACEOUS CYST FOREHEAD X 2 (N/A) EXCISION SEBACEOUS CYST LEFT SHOULDER AND MID-BACK (Left) EXCISION HIDRADENITIS POSTERIOR NECK  (N/A)  Patient location during evaluation: PACU Anesthesia Type: General Level of consciousness: awake and alert Pain management: pain level controlled Vital Signs Assessment: post-procedure vital signs reviewed and stable Respiratory status: spontaneous breathing, nonlabored ventilation, respiratory function stable and patient connected to nasal cannula oxygen Cardiovascular status: blood pressure returned to baseline and stable Postop Assessment: no signs of nausea or vomiting Anesthetic complications: no    Last Vitals:  Filed Vitals:   01/28/16 1225 01/28/16 1237  BP: 150/80 152/79  Pulse: 110 110  Temp:  36.6 C  Resp: 17 11    Last Pain:  Filed Vitals:   01/28/16 1255  PainSc: 9                  Zelma Mazariego,JAMES TERRILL

## 2016-01-28 NOTE — Anesthesia Preprocedure Evaluation (Addendum)
Anesthesia Evaluation  Patient identified by MRN, date of birth, ID band Patient awake  General Assessment Comment:Quite obese , thick neck  Reviewed: Allergy & Precautions, NPO status , Patient's Chart, lab work & pertinent test results  Airway Mallampati: II  TM Distance: >3 FB Neck ROM: Full    Dental  (+) Teeth Intact, Dental Advisory Given, Poor Dentition   Pulmonary Current Smoker,     + decreased breath sounds      Cardiovascular hypertension,  Rhythm:Regular Rate:Normal     Neuro/Psych    GI/Hepatic (+)     substance abuse  alcohol use and marijuana use, Heavy alcohol   Endo/Other  diabetesMorbid obesity  Renal/GU      Musculoskeletal  (+) Arthritis ,   Abdominal (+) + obese,   Peds  Hematology   Anesthesia Other Findings   Reproductive/Obstetrics                           Anesthesia Physical Anesthesia Plan  ASA: III  Anesthesia Plan: General   Post-op Pain Management:    Induction: Intravenous  Airway Management Planned: Video Laryngoscope Planned and Oral ETT  Additional Equipment:   Intra-op Plan:   Post-operative Plan: Possible Post-op intubation/ventilation and Extubation in OR  Informed Consent: I have reviewed the patients History and Physical, chart, labs and discussed the procedure including the risks, benefits and alternatives for the proposed anesthesia with the patient or authorized representative who has indicated his/her understanding and acceptance.     Plan Discussed with: CRNA and Surgeon  Anesthesia Plan Comments:        Anesthesia Quick Evaluation

## 2016-01-28 NOTE — Discharge Instructions (Signed)
° ° °  POST OP INSTRUCTIONS  Always review your discharge instruction sheet given to you by the facility where your surgery was performed.   1. A prescription for pain medication may be given to you upon discharge. Take your pain medication as prescribed, if needed. If narcotic pain medicine is not needed, then you make take acetaminophen (Tylenol) or ibuprofen (Advil) as needed.  2. Take your usually prescribed medications unless otherwise directed. 3. If you need a refill on your pain medication, please contact our office. All narcotic pain medicine now requires a paper prescription.  Phoned in and fax refills are no longer allowed by law.  Prescriptions will not be filled after 5 pm or on weekends.  4. You should follow a light diet for the remainder of the day after your procedure. 5. Most patients will experience some mild swelling and/or bruising in the area of the incisions. It may take several days to resolve. 6. It is common to experience some constipation if taking pain medication after surgery. Increasing fluid intake and taking a stool softener (such as Colace) will usually help or prevent this problem from occurring. A mild laxative (Milk of Magnesia or Miralax) should be taken according to package directions if there are no bowel movements after 48 hours.  7. Unless discharge instructions indicate otherwise, you may remove your bandages 48 hours after surgery, and you may shower at that time. You may have steri-strips (small white skin tapes) in place directly over the incision.  These strips should be left on the skin for 7-10 days.  If your surgeon used Dermabond (skin glue) on the incision, you may shower in 24 hours.  The glue will flake off over the next 2-3 weeks.    8. ACTIVITIES:  Do no strenuous exercise or activity for 1 week. You may drive when you are no longer taking prescription pain medication, you can comfortably wear a seatbelt, and you can maneuver your car. 10.You may need  to see your doctor in the office for a follow-up appointment.  Please       check with your doctor.  11.When you receive a new Port-a-Cath, you will get a product guide and        ID card.  Please keep them in case you need them.  WHEN TO CALL YOUR DOCTOR [phone]): 1. Fever over 101.0 2. Chills 3. Continued bleeding from incision 4. Increased redness and tenderness at the site 5. Shortness of breath, difficulty breathing   The clinic staff is available to answer your questions during regular business hours. Please dont hesitate to call and ask to speak to one of the nurses or medical assistants for clinical concerns. If you have a medical emergency, go to the nearest emergency room or call 911.  [facility] is always on call at the hospital.     For further information, please visit [email or web address]

## 2016-01-28 NOTE — Op Note (Signed)
Preop diagnosis: Sebaceous cyst of for head 2; sebaceous cyst of the upper back and posterior left shoulder; hidradenitis posterior neck; hidradenitis right axilla Postop diagnosis: Same Surgeon:  Rayen Palen K. Anesthesia:  Gen Indications:  This is a 40 year old male with morbid obesity who presents with long history of multiple skin issues. He has chronic infections that appear in his axilla and in his posterior neck. These tend to chronically drain purulent fluid. He has also had previous infected sebaceous cysts of the upper back as well as his posterior left shoulder. He has also developed 2 sebaceous cyst on his forehead just above his eyebrows. He would like to have the chronically infected areas of his axilla and posterior neck excised. He would also like to have the cyst removed before they recur or become infected.  Description of procedure: The patient is brought to the operating room and placed in a supine position on the operating table. After an adequate level of general anesthesia was obtained, he was positioned on his right side on a beanbag. His back and posterior neck were prepped with ChloraPrep and draped in sterile fashion. A timeout was taken to ensure the proper patient and proper procedure.  We began on his posterior left shoulder. He has a palpable 2 cm subcutaneous mass with some overlying scarring. We infiltrated this area with 0.25% Marcaine with epinephrine. We dissected down through the dermis into subcutaneous tissues tissue. We excised a sebaceous cyst. Hemostasis was obtained with cautery. The wound was irrigated and closed with 3-0 Vicryl and 4-0 Monocryl.  We then addressed the sebaceous cyst in the midline upper back. We made another elliptical incision around the wound after infiltrated with Marcaine. We excised the entire cyst. The wound was closed with 3-0 Vicryl and 4-0 Monocryl.  We then made an elliptical incision around all of the hard scar tissue and  sinuses in the posterior neck. There is no active purulence or sign of infection at this time.  We excised all the tissue down to normal-appearing subcutaneous fat. The wound was closed with a running 3-0 Vicryl and a subcuticular layer of 4-0 Monocryl. Dermabond was used to dress this incision because of its location. Benzoin Steri-Strips were used for the left shoulder and the upper back wounds. Tegaderm dressings were placed over those.  We then positioned patient back in a supine position. His right axilla was prepped with ChloraPrep and draped sterile fashion. We have treated the area around the long area of hidradenitis with 0.25% Marcaine with epinephrine. We made a 5 cm long elliptical incision around the scar tissue. We dissected completely around the chronic scar tissue to soft subcutaneous fat. There is a small superficial sebaceous cyst just inferior to this area was also excised. We closed the smaller wound with 4-0 Monocryl. The long incision was closed with a running 3-0 Vicryl and a subcuticular 4-0 Monocryl.  Benzoin and Steri-Strips were placed along with a Tegaderm dressing.  We then prepped his for head and draped sterile fashion. The patient has two 1 cm palpable cyst and is for head. We made small incisions over each one and dissected the spacious cyst completely. The wounds were closed with 4-0 Monocryl. Dermabond was placed over both of these wounds.  The patient was then extubated but recovery was stable condition. All sponge, instrument, and needle counts are correct.  Glenn ArmsMatthew K. Corliss Skainssuei, MD, [facility]  General/ Trauma Surgery  01/28/2016 11:47 AM

## 2016-01-28 NOTE — H&P (Signed)
History of Present Illness  Patient words: neck cyst.  The patient is a 40 year old male who presents with a complaint of Mass. Referred by Dr. Gean Maidens Derm for multiple cysts  This is a 40 year old male with morbid obesity who presents with long history of multiple skin issues. He has chronic infections that appear in his axilla and in his posterior neck. These tend to chronically drain purulent fluid. He has also had previous infected sebaceous cysts of the upper back as well as his posterior left shoulder. He has also developed 2 sebaceous cyst on his forehead just above his eyebrows. He would like to have the chronically infected areas of his axilla and posterior neck excised. He would also like to have the cyst removed before they recur or become infected.   Other Problems  Arthritis  Past Surgical History  No pertinent past surgical history  Diagnostic Studies History  Colonoscopy never  Allergies  No Known Drug Allergies 12/28/2015  Medication History LORazepam (  Tablet, Oral) Active. Oxycodone-Acetaminophen (10-325MG  Tablet, Oral) Active. Allopurinol (  Tablet, Oral) Active. Clindamycin HCl (  Capsule, Oral) Active. Gabapentin (  Capsule, Oral) Active. MetFORMIN HCl (  Tablet, Oral) Active. Metoprolol Succinate ER (  Tablet ER 24HR, Oral) Active. Naproxen (  Tablet, Oral) Active. RifAMPin (  Capsule, Oral) Active. Triamcinolone Acetonide (0.1% Cream, External) Active. Triamterene-HCTZ (37.5-25MG  Tablet, Oral) Active. Medications Reconciled  Social History Alcohol use Moderate alcohol use. No caffeine use Tobacco use Current every day smoker.  Family History  Alcohol Abuse Father. Hypertension Mother.     Review of Systems  General Present- Night Sweats. Not Present- Appetite Loss, Chills, Fatigue, Fever, Weight Gain and Weight Loss. Skin Present- Non-Healing Wounds. Not Present- Change in  Wart/Mole, Dryness, Hives, Jaundice, New Lesions, Rash and Ulcer. HEENT Not Present- Earache, Hearing Loss, Hoarseness, Nose Bleed, Oral Ulcers, Ringing in the Ears, Seasonal Allergies, Sinus Pain, Sore Throat, Visual Disturbances, Wears glasses/contact lenses and Yellow Eyes. Respiratory Not Present- Bloody sputum, Chronic Cough, Difficulty Breathing, Snoring and Wheezing. Breast Not Present- Breast Mass, Breast Pain, Nipple Discharge and Skin Changes. Cardiovascular Not Present- Chest Pain, Difficulty Breathing Lying Down, Leg Cramps, Palpitations, Rapid Heart Rate, Shortness of Breath and Swelling of Extremities. Gastrointestinal Not Present- Abdominal Pain, Bloating, Bloody Stool, Change in Bowel Habits, Chronic diarrhea, Constipation, Difficulty Swallowing, Excessive gas, Gets full quickly at meals, Hemorrhoids, Indigestion, Nausea, Rectal Pain and Vomiting. Male Genitourinary Not Present- Blood in Urine, Change in Urinary Stream, Frequency, Impotence, Nocturia, Painful Urination, Urgency and Urine Leakage. Musculoskeletal Not Present- Back Pain, Joint Pain, Joint Stiffness, Muscle Pain, Muscle Weakness and Swelling of Extremities. Neurological Not Present- Decreased Memory, Fainting, Headaches, Numbness, Seizures, Tingling, Tremor, Trouble walking and Weakness. Psychiatric Not Present- Anxiety, Bipolar, Change in Sleep Pattern, Depression, Fearful and Frequent crying. Hematology Not Present- Easy Bruising, Excessive bleeding, Gland problems, HIV and Persistent Infections.  Vitals  Weight: 341 lb Height: 68in Body Surface Area: 2.56 m Body Mass Index: 51.85 kg/m  Temp.: 98.107F(Temporal)  Pulse: 98 (Regular)  BP: 144/84 (Sitting, Left Arm, Standard)      Physical Exam   The physical exam findings are as follows: Note:Obese male in NAD Skin - forehead just above eyebrows - two 1 cm subcutaneous firm masses; no sign of infection Right axilla - long 4 cm area of thickening  with some openings; no current drainage Posterior neck 2 x 5 cm area of chronic scarring with some purulent drainage; Mid-upper back - 3 cm firm subcutaneous cyst Posterior left shoulder 2 cm  firm subcutaneous cyst    Assessment & Plan   INFECTED SEBACEOUS CYST (L72.3) Impression: Mid - upper back - 3 cm Posterior left shoulder 2 cm Forehead - 1 cm x two  HIDRADENITIS AXILLARIS (L73.2) Impression: Right axilla 4 cm  HIDRADENITIS (L73.2) Impression: posterior neck 2 x 5 cm  Current Plans Schedule for Surgery - Excision of sebaceous cysts and hidradenitis - right axilla, forehead x 2, left posterior shoulder, mid-upper back, and posterior neck. The surgical procedure has been discussed with the patient. Potential risks, benefits, alternative treatments, and expected outcomes have been explained. All of the patient's questions at this time have been answered. The likelihood of reaching the patient's treatment goal is good. The patient understand the proposed surgical procedure and wishes to proceed.  Wilmon ArmsMatthew K. Corliss Skainssuei, MD, [facility]  General/ Trauma Surgery  01/28/2016 7:59 AM

## 2016-01-28 NOTE — Transfer of Care (Signed)
Immediate Anesthesia Transfer of Care Note  Patient: Glenn Murphy  Procedure(s) Performed: Procedure(s): EXCISION HIDRADENITIS RIGHT AXILLA  (Right) EXCISION SEBACEOUS CYST FOREHEAD X 2 (N/A) EXCISION SEBACEOUS CYST LEFT SHOULDER AND MID-BACK (Left) EXCISION HIDRADENITIS POSTERIOR NECK  (N/A)  Patient Location: PACU  Anesthesia Type:General  Level of Consciousness: awake, alert , oriented and patient cooperative  Airway & Oxygen Therapy: Patient Spontanous Breathing and Patient connected to face mask oxygen  Post-op Assessment: Report given to RN and Post -op Vital signs reviewed and stable  Post vital signs: Reviewed and stable  Last Vitals:  Filed Vitals:   01/28/16 0756  BP: 146/96  Pulse: 98  Temp: 36.8 C  Resp: 20    Complications: No apparent anesthesia complications

## 2016-01-28 NOTE — Progress Notes (Addendum)
Dilaudid .5mg  iv wasted in sink and flushed. witnessed by [facility] Surgical SuitesCaroline MoseleyRN.

## 2016-01-29 ENCOUNTER — Encounter (HOSPITAL_COMMUNITY): Payer: Self-pay | Admitting: Surgery

## 2016-08-09 ENCOUNTER — Encounter (HOSPITAL_COMMUNITY): Payer: Self-pay | Admitting: *Deleted

## 2016-08-09 ENCOUNTER — Ambulatory Visit (HOSPITAL_COMMUNITY)
Admission: EM | Admit: 2016-08-09 | Discharge: 2016-08-09 | Disposition: A | Discharge status: Home / Self Care | Payer: Self-pay | Attending: Emergency Medicine | Admitting: Emergency Medicine

## 2016-08-09 DIAGNOSIS — M109 Gout, unspecified: Secondary | ICD-10-CM

## 2016-08-09 MED ORDER — LIDOCAINE 5 % EX PTCH: 1.0000 | MEDICATED_PATCH | CUTANEOUS | 0 refills | Status: DC

## 2016-08-09 MED ORDER — PREDNISONE 50 MG PO TABS: ORAL_TABLET | ORAL | 0 refills | Status: DC

## 2016-08-09 NOTE — ED Provider Notes (Signed)
[facility]    CSN: [id number] Arrival date & time: 08/09/16  1701     History   Chief Complaint Chief Complaint  Patient presents with  . Knee Pain    HPI Glenn BollmanDavid M Murphy is a 40 y.o. male.   HPI  History 40 year old man here for evaluation of left knee pain. This started about a week ago with acute onset pain and swelling of the left knee. He denies any injury or trauma. It has improved some, but he continues to have significant pain, particularly with weightbearing. He does have a history of gout and states this feels similar. He is on allopurinol, but he hasn't been taking it and had a a lot of red meat recently.  He is on Percocet chronically. This is prescribed by Dr. August Saucerean.  Past Medical History:  Diagnosis Date  . Abscess    recurrent infections  . Arthritis   . Diabetes mellitus without complication (HCC)   . Hypertension   . Obesity     Patient Active Problem List   Diagnosis Date Noted  . Abscess 05/26/2014    Past Surgical History:  Procedure Laterality Date  . CYST EXCISION N/A 01/28/2016   Procedure: EXCISION SEBACEOUS CYST FOREHEAD X 2;  Surgeon: Manus RuddMatthew Tsuei, MD;  Location: [facility] OR;  Service: General;  Laterality: N/A;  . CYST REMOVAL TRUNK Left 01/28/2016   Procedure: EXCISION SEBACEOUS CYST LEFT SHOULDER AND MID-BACK;  Surgeon: Manus RuddMatthew Tsuei, MD;  Location: [facility] OR;  Service: General;  Laterality: Left;  . HYDRADENITIS EXCISION Right 01/28/2016   Procedure: EXCISION HIDRADENITIS RIGHT AXILLA ;  Surgeon: Manus RuddMatthew Tsuei, MD;  [facility];  Service: General;  Laterality: Right;  . HYDRADENITIS EXCISION N/A 01/28/2016   Procedure: EXCISION HIDRADENITIS POSTERIOR NECK ;  Surgeon: Manus RuddMatthew Tsuei, MD;  Location: [facility] OR;  Service: General;  Laterality: N/A;       Home Medications    Prior to Admission medications   Medication Sig Start Date End Date Taking? Authorizing Provider  allopurinol (ZYLOPRIM) 300 MG tablet Take 300 mg by mouth daily.  11/16/15   Historical Provider, MD  clindamycin (CLEOCIN) 300 MG capsule Take 300 mg by mouth 2 (two) times daily. 12/19/15   Historical Provider, MD  gabapentin (NEURONTIN) 400 MG capsule Take 400 mg by mouth 4 (four) times daily. 12/19/15   Historical Provider, MD  HYDROmorphone (DILAUDID) 2 MG tablet Take 1 tablet (2 mg total) by mouth every 4 (four) hours as needed for moderate pain or severe pain. 01/28/16   Manus RuddMatthew Tsuei, MD  lidocaine (LIDODERM) 5 % Place 1 patch onto the skin daily. Remove & Discard patch within 12 hours or as directed by MD 08/09/16   Charm RingsErin J Kamori Kitchens, MD  LORazepam (ATIVAN) 1 MG tablet Take 1 mg by mouth at bedtime. 12/19/15   Historical Provider, MD  metFORMIN (GLUCOPHAGE) 1000 MG tablet Take 1,000 mg by mouth daily. 11/16/15   Historical Provider, MD  metoprolol succinate (TOPROL-XL) 25 MG 24 hr tablet Take 25 mg by mouth daily. 12/19/15   Historical Provider, MD  naproxen (NAPROSYN) 500 MG tablet Take 1 tablet (500 mg total) by mouth 2 (two) times daily with a meal. 05/27/15   Linna HoffJames D Kindl, MD  predniSONE (DELTASONE) 50 MG tablet Take 1 pill daily for 5 days. 08/09/16   Charm RingsErin J Sabastion Hrdlicka, MD  rifampin (RIFADIN) 300 MG capsule Take 300 mg by mouth 2 (two) times daily. 12/19/15   Historical Provider, MD  triamcinolone cream (  KENALOG) 0.1 % Apply 1 application topically 2 (two) times daily. 12/19/15   Historical Provider, MD  triamterene-hydrochlorothiazide (MAXZIDE-25) 37.5-25 MG tablet Take 1 tablet by mouth daily. 11/16/15   Historical Provider, MD    Family History Family History  Problem Relation Age of Onset  . Cancer Father     Social History Social History  Substance Use Topics  . Smoking status: Current Every Day Smoker    Packs/day: 0.50    Types: Cigarettes  . Smokeless tobacco: Not on file  . Alcohol use Yes     Comment: 40oz beer a day  fifth of liquor over  q 3-4 days     Allergies   Other   Review of Systems Review of Systems As in history of present  illness  Physical Exam Triage Vital Signs ED Triage Vitals  Enc Vitals Group     BP 08/09/16 1718 159/89     Pulse Rate 08/09/16 1718 101     Resp 08/09/16 1718 18     Temp 08/09/16 1718 99.7 F (37.6 C)     Temp Source 08/09/16 1718 Oral     SpO2 08/09/16 1718 98 %     Weight --      Height --      Head Circumference --      Peak Flow --      Pain Score 08/09/16 1722 7     Pain Loc --      Pain Edu? --      Excl. in GC? --    No data found.   Updated Vital Signs BP 159/89 (BP Location: Right Arm)   Pulse 101   Temp 99.7 F (37.6 C) (Oral)   Resp 18   SpO2 98%   Visual Acuity Right Eye Distance:   Left Eye Distance:   Bilateral Distance:    Right Eye Near:   Left Eye Near:    Bilateral Near:     Physical Exam  Constitutional: He is oriented to person, place, and time. He appears well-developed and well-nourished.  Cardiovascular: Normal rate.   Pulmonary/Chest: Effort normal.  Musculoskeletal:  Left knee: Mild to moderate swelling. No joint laxity. Mild tenderness, mostly along the medial joint line.  Neurological: He is alert and oriented to person, place, and time.     UC Treatments / Results  Labs (all labs ordered are listed, but only abnormal results are displayed) Labs Reviewed - No data to display  EKG  EKG Interpretation None       Radiology No results found.  Procedures Procedures (including critical care time)  Medications Ordered in UC Medications - No data to display   Initial Impression / Assessment and Plan / UC Course  I have reviewed the triage vital signs and the nursing notes.  Pertinent labs & imaging results that were available during my care of the patient were reviewed by me and considered in my medical decision making (see chart for details).  Clinical Course    This is likely a gout flare. We'll treat with prednisone for 5 days. Discussed options for pain control for prednisone takes effect. Given his chronic  narcotic use, I have provided a prescription for Lidoderm patches to see if those are beneficial. Follow-up with PCP as needed.  Final Clinical Impressions(s) / UC Diagnoses   Final diagnoses:  Acute gout of left knee, unspecified cause    New Prescriptions New Prescriptions   LIDOCAINE (LIDODERM) 5 %    Place  1 patch onto the skin daily. Remove & Discard patch within 12 hours or as directed by MD   PREDNISONE (DELTASONE) 50 MG TABLET    Take 1 pill daily for 5 days.     Charm RingsErin J Nikolis Berent, MD 08/09/16 [phone]

## 2016-08-09 NOTE — ED Triage Notes (Addendum)
Pain  And  Swelling  l  Knee          Hurts  To  Bear  Weight             denys any  specefic  Injury

## 2016-08-09 NOTE — Discharge Instructions (Signed)
This is likely a flare a gout. Continue taking your allopurinol. Avoid beer and red meat. Take prednisone daily for 5 days. This will calm down the inflammation and improve your pain. Use the Lidoderm patches as prescribed. Follow-up with your primary care doctor if not improving in the next 2 days.

## 2019-12-19 ENCOUNTER — Ambulatory Visit: Payer: Self-pay | Attending: Family

## 2019-12-19 DIAGNOSIS — Z23 Encounter for immunization: Secondary | ICD-10-CM

## 2019-12-19 NOTE — Progress Notes (Signed)
   Covid-19 Vaccination Clinic  Name:  Glenn Murphy    MRN: [id number] DOB: 1976-03-30  12/19/2019  Glenn Murphy was observed post Covid-19 immunization for 15 minutes without incident. He was provided with Vaccine Information Sheet and instruction to access the V-Safe system.   Glenn Murphy was instructed to call 911 with any severe reactions post vaccine: [name] Difficulty breathing  . Swelling of face and throat  . A fast heartbeat  . A bad rash all over body  . Dizziness and weakness   Immunizations Administered    Name Date Dose VIS Date Route   Moderna COVID-19 Vaccine 12/19/2019 12:26 PM 0.5 mL 09/10/2019 Intramuscular   Manufacturer: Moderna   Lot: [id number]   NDC: 70350-093-81

## 2020-01-14 ENCOUNTER — Inpatient Hospital Stay (HOSPITAL_COMMUNITY)
Admission: EM | Admit: 2020-01-14 | Discharge: 2020-01-25 | DRG: 871 | Disposition: A | Discharge status: Home Health | Payer: BLUE CROSS/BLUE SHIELD | Attending: Internal Medicine | Admitting: Internal Medicine

## 2020-01-14 ENCOUNTER — Emergency Department (HOSPITAL_COMMUNITY): Payer: BLUE CROSS/BLUE SHIELD

## 2020-01-14 ENCOUNTER — Encounter (HOSPITAL_COMMUNITY): Payer: Self-pay | Admitting: Student

## 2020-01-14 ENCOUNTER — Other Ambulatory Visit: Payer: Self-pay

## 2020-01-14 DIAGNOSIS — Z888 Allergy status to other drugs, medicaments and biological substances status: Secondary | ICD-10-CM

## 2020-01-14 DIAGNOSIS — N3289 Other specified disorders of bladder: Secondary | ICD-10-CM | POA: Diagnosis not present

## 2020-01-14 DIAGNOSIS — R339 Retention of urine, unspecified: Secondary | ICD-10-CM

## 2020-01-14 DIAGNOSIS — N179 Acute kidney failure, unspecified: Secondary | ICD-10-CM | POA: Diagnosis present

## 2020-01-14 DIAGNOSIS — L732 Hidradenitis suppurativa: Secondary | ICD-10-CM | POA: Diagnosis present

## 2020-01-14 DIAGNOSIS — R338 Other retention of urine: Secondary | ICD-10-CM | POA: Diagnosis not present

## 2020-01-14 DIAGNOSIS — S3660XA Unspecified injury of rectum, initial encounter: Secondary | ICD-10-CM | POA: Diagnosis not present

## 2020-01-14 DIAGNOSIS — T83031A Leakage of indwelling urethral catheter, initial encounter: Secondary | ICD-10-CM | POA: Diagnosis not present

## 2020-01-14 DIAGNOSIS — Y738 Miscellaneous gastroenterology and urology devices associated with adverse incidents, not elsewhere classified: Secondary | ICD-10-CM | POA: Diagnosis not present

## 2020-01-14 DIAGNOSIS — S3730XA Unspecified injury of urethra, initial encounter: Secondary | ICD-10-CM | POA: Diagnosis not present

## 2020-01-14 DIAGNOSIS — N1330 Unspecified hydronephrosis: Secondary | ICD-10-CM | POA: Diagnosis not present

## 2020-01-14 DIAGNOSIS — R531 Weakness: Secondary | ICD-10-CM | POA: Diagnosis present

## 2020-01-14 DIAGNOSIS — D849 Immunodeficiency, unspecified: Secondary | ICD-10-CM | POA: Diagnosis present

## 2020-01-14 DIAGNOSIS — N320 Bladder-neck obstruction: Secondary | ICD-10-CM | POA: Diagnosis not present

## 2020-01-14 DIAGNOSIS — R3989 Other symptoms and signs involving the genitourinary system: Secondary | ICD-10-CM | POA: Diagnosis not present

## 2020-01-14 DIAGNOSIS — E1110 Type 2 diabetes mellitus with ketoacidosis without coma: Secondary | ICD-10-CM | POA: Diagnosis not present

## 2020-01-14 DIAGNOSIS — N360 Urethral fistula: Secondary | ICD-10-CM | POA: Diagnosis not present

## 2020-01-14 DIAGNOSIS — Z809 Family history of malignant neoplasm, unspecified: Secondary | ICD-10-CM

## 2020-01-14 DIAGNOSIS — Z79899 Other long term (current) drug therapy: Secondary | ICD-10-CM

## 2020-01-14 DIAGNOSIS — E876 Hypokalemia: Secondary | ICD-10-CM | POA: Diagnosis present

## 2020-01-14 DIAGNOSIS — F17210 Nicotine dependence, cigarettes, uncomplicated: Secondary | ICD-10-CM | POA: Diagnosis present

## 2020-01-14 DIAGNOSIS — Z6841 Body Mass Index (BMI) 40.0 and over, adult: Secondary | ICD-10-CM | POA: Diagnosis not present

## 2020-01-14 DIAGNOSIS — R188 Other ascites: Secondary | ICD-10-CM | POA: Diagnosis not present

## 2020-01-14 DIAGNOSIS — S3720XA Unspecified injury of bladder, initial encounter: Secondary | ICD-10-CM

## 2020-01-14 DIAGNOSIS — E860 Dehydration: Secondary | ICD-10-CM | POA: Diagnosis present

## 2020-01-14 DIAGNOSIS — M1990 Unspecified osteoarthritis, unspecified site: Secondary | ICD-10-CM | POA: Diagnosis present

## 2020-01-14 DIAGNOSIS — D62 Acute posthemorrhagic anemia: Secondary | ICD-10-CM | POA: Diagnosis not present

## 2020-01-14 DIAGNOSIS — R310 Gross hematuria: Secondary | ICD-10-CM | POA: Diagnosis present

## 2020-01-14 DIAGNOSIS — K625 Hemorrhage of anus and rectum: Secondary | ICD-10-CM | POA: Diagnosis present

## 2020-01-14 DIAGNOSIS — A419 Sepsis, unspecified organism: Secondary | ICD-10-CM | POA: Diagnosis present

## 2020-01-14 DIAGNOSIS — E872 Acidosis: Secondary | ICD-10-CM | POA: Diagnosis not present

## 2020-01-14 DIAGNOSIS — R768 Other specified abnormal immunological findings in serum: Secondary | ICD-10-CM | POA: Diagnosis present

## 2020-01-14 DIAGNOSIS — E1122 Type 2 diabetes mellitus with diabetic chronic kidney disease: Secondary | ICD-10-CM | POA: Diagnosis present

## 2020-01-14 DIAGNOSIS — T83021A Displacement of indwelling urethral catheter, initial encounter: Secondary | ICD-10-CM | POA: Diagnosis not present

## 2020-01-14 DIAGNOSIS — Z20822 Contact with and (suspected) exposure to covid-19: Secondary | ICD-10-CM | POA: Diagnosis present

## 2020-01-14 DIAGNOSIS — R17 Unspecified jaundice: Secondary | ICD-10-CM | POA: Diagnosis not present

## 2020-01-14 DIAGNOSIS — Z91018 Allergy to other foods: Secondary | ICD-10-CM | POA: Diagnosis not present

## 2020-01-14 DIAGNOSIS — G9341 Metabolic encephalopathy: Secondary | ICD-10-CM

## 2020-01-14 DIAGNOSIS — D696 Thrombocytopenia, unspecified: Secondary | ICD-10-CM | POA: Diagnosis present

## 2020-01-14 DIAGNOSIS — I129 Hypertensive chronic kidney disease with stage 1 through stage 4 chronic kidney disease, or unspecified chronic kidney disease: Secondary | ICD-10-CM | POA: Diagnosis present

## 2020-01-14 DIAGNOSIS — F410 Panic disorder [episodic paroxysmal anxiety] without agoraphobia: Secondary | ICD-10-CM | POA: Diagnosis present

## 2020-01-14 DIAGNOSIS — M791 Myalgia, unspecified site: Secondary | ICD-10-CM | POA: Diagnosis present

## 2020-01-14 DIAGNOSIS — Z79891 Long term (current) use of opiate analgesic: Secondary | ICD-10-CM

## 2020-01-14 DIAGNOSIS — Z794 Long term (current) use of insulin: Secondary | ICD-10-CM

## 2020-01-14 DIAGNOSIS — Z791 Long term (current) use of non-steroidal anti-inflammatories (NSAID): Secondary | ICD-10-CM

## 2020-01-14 DIAGNOSIS — I7389 Other specified peripheral vascular diseases: Secondary | ICD-10-CM | POA: Diagnosis present

## 2020-01-14 DIAGNOSIS — E871 Hypo-osmolality and hyponatremia: Secondary | ICD-10-CM | POA: Diagnosis present

## 2020-01-14 DIAGNOSIS — A429 Actinomycosis, unspecified: Secondary | ICD-10-CM | POA: Diagnosis not present

## 2020-01-14 DIAGNOSIS — E1111 Type 2 diabetes mellitus with ketoacidosis with coma: Secondary | ICD-10-CM | POA: Diagnosis not present

## 2020-01-14 LAB — POCT I-STAT EG7
Acid-base deficit: 23 mmol/L — ABNORMAL HIGH (ref 0.0–2.0)
Bicarbonate: 6.1 mmol/L — ABNORMAL LOW (ref 20.0–28.0)
Calcium, Ion: 1.18 mmol/L (ref 1.15–1.40)
HCT: 44 % (ref 39.0–52.0)
Hemoglobin: 15 g/dL (ref 13.0–17.0)
O2 Saturation: 95 %
Potassium: 2.9 mmol/L — ABNORMAL LOW (ref 3.5–5.1)
Sodium: 126 mmol/L — ABNORMAL LOW (ref 135–145)
TCO2: 7 mmol/L — ABNORMAL LOW (ref 22–32)
pCO2, Ven: 23.6 mmHg — ABNORMAL LOW (ref 44.0–60.0)
pH, Ven: 7.021 — CL (ref 7.250–7.430)
pO2, Ven: 108 mmHg — ABNORMAL HIGH (ref 32.0–45.0)

## 2020-01-14 LAB — CBG MONITORING, ED
Glucose-Capillary: >600 mg/dL (ref 70–99)
Glucose-Capillary: >600 mg/dL (ref 70–99)
Glucose-Capillary: 593 mg/dL (ref 70–99)
Glucose-Capillary: 525 mg/dL (ref 70–99)

## 2020-01-14 LAB — HEPATIC FUNCTION PANEL
ALT: 28 U/L (ref 0–44)
AST: 29 U/L (ref 15–41)
Albumin: 2.5 g/dL — ABNORMAL LOW (ref 3.5–5.0)
Alkaline Phosphatase: 185 U/L — ABNORMAL HIGH (ref 38–126)
Bilirubin, Direct: 1.4 mg/dL — ABNORMAL HIGH (ref 0.0–0.2)
Indirect Bilirubin: 1.9 mg/dL — ABNORMAL HIGH (ref 0.3–0.9)
Total Bilirubin: 3.3 mg/dL — ABNORMAL HIGH (ref 0.3–1.2)
Total Protein: 8.9 g/dL — ABNORMAL HIGH (ref 6.5–8.1)

## 2020-01-14 LAB — LIPASE, BLOOD: Lipase: 40 U/L (ref 11–51)

## 2020-01-14 LAB — BASIC METABOLIC PANEL
BUN: 39 mg/dL — ABNORMAL HIGH (ref 6–20)
BUN: 54 mg/dL — ABNORMAL HIGH (ref 6–20)
CO2: <7 mmol/L — ABNORMAL LOW (ref 22–32)
CO2: <7 mmol/L — ABNORMAL LOW (ref 22–32)
Calcium: 9 mg/dL (ref 8.9–10.3)
Calcium: 9.2 mg/dL (ref 8.9–10.3)
Chloride: 93 mmol/L — ABNORMAL LOW (ref 98–111)
Chloride: 94 mmol/L — ABNORMAL LOW (ref 98–111)
Creatinine, Ser: 2.04 mg/dL — ABNORMAL HIGH (ref 0.61–1.24)
Creatinine, Ser: 2.76 mg/dL — ABNORMAL HIGH (ref 0.61–1.24)
GFR calc Af Amer: 44 mL/min — ABNORMAL LOW (ref >60)
GFR calc Af Amer: 31 mL/min — ABNORMAL LOW (ref >60)
GFR calc non Af Amer: 38 mL/min — ABNORMAL LOW (ref >60)
GFR calc non Af Amer: 26 mL/min — ABNORMAL LOW (ref >60)
Glucose, Bld: 734 mg/dL (ref 70–99)
Glucose, Bld: 671 mg/dL (ref 70–99)
Potassium: 3.1 mmol/L — ABNORMAL LOW (ref 3.5–5.1)
Potassium: 3.2 mmol/L — ABNORMAL LOW (ref 3.5–5.1)
Sodium: 124 mmol/L — ABNORMAL LOW (ref 135–145)
Sodium: 126 mmol/L — ABNORMAL LOW (ref 135–145)

## 2020-01-14 LAB — CBC
HCT: 40.6 % (ref 39.0–52.0)
HCT: 40.5 % (ref 39.0–52.0)
Hemoglobin: 14 g/dL (ref 13.0–17.0)
Hemoglobin: 14.2 g/dL (ref 13.0–17.0)
MCH: 32.9 pg (ref 26.0–34.0)
MCH: 32.6 pg (ref 26.0–34.0)
MCHC: 34.5 g/dL (ref 30.0–36.0)
MCHC: 35.1 g/dL (ref 30.0–36.0)
MCV: 95.3 fL (ref 80.0–100.0)
MCV: 92.9 fL (ref 80.0–100.0)
Platelets: 203 10*3/uL (ref 150–400)
Platelets: 194 10*3/uL (ref 150–400)
RBC: 4.26 MIL/uL (ref 4.22–5.81)
RBC: 4.36 MIL/uL (ref 4.22–5.81)
RDW: 13.8 % (ref 11.5–15.5)
RDW: 13.6 % (ref 11.5–15.5)
WBC: 26.1 10*3/uL — ABNORMAL HIGH (ref 4.0–10.5)
WBC: 27.5 10*3/uL — ABNORMAL HIGH (ref 4.0–10.5)
nRBC: 0 % (ref 0.0–0.2)
nRBC: 0.1 % (ref 0.0–0.2)

## 2020-01-14 LAB — RESPIRATORY PANEL BY RT PCR (FLU A&B, COVID)
Influenza A by PCR: NEGATIVE
Influenza B by PCR: NEGATIVE
SARS Coronavirus 2 by RT PCR: NEGATIVE

## 2020-01-14 LAB — BETA-HYDROXYBUTYRIC ACID: Beta-Hydroxybutyric Acid: >8 mmol/L — ABNORMAL HIGH (ref 0.05–0.27)

## 2020-01-14 LAB — BRAIN NATRIURETIC PEPTIDE: B Natriuretic Peptide: 264.7 pg/mL — ABNORMAL HIGH (ref 0.0–100.0)

## 2020-01-14 LAB — LACTIC ACID, PLASMA: Lactic Acid, Venous: 4.2 mmol/L (ref 0.5–1.9)

## 2020-01-14 MED ORDER — PIPERACILLIN-TAZOBACTAM 3.375 G IVPB
3.3750 g | Freq: Three times a day (TID) | INTRAVENOUS | Status: DC
  Administered 2020-01-15: 3.375 g via INTRAVENOUS
  Filled 2020-01-14 (×2): qty 50

## 2020-01-14 MED ORDER — HEPARIN SODIUM (PORCINE) 5000 UNIT/ML IJ SOLN
5000.0000 [IU] | Freq: Three times a day (TID) | INTRAMUSCULAR | Status: DC
  Administered 2020-01-15 – 2020-01-22 (×22): 5000 [IU] via SUBCUTANEOUS
  Filled 2020-01-14 (×24): qty 1

## 2020-01-14 MED ORDER — ALBUTEROL SULFATE (2.5 MG/3ML) 0.083% IN NEBU: 5.0000 mg | INHALATION_SOLUTION | Freq: Once | RESPIRATORY_TRACT | Status: DC

## 2020-01-14 MED ORDER — PANTOPRAZOLE SODIUM 40 MG IV SOLR
40.0000 mg | Freq: Every day | INTRAVENOUS | Status: DC
  Administered 2020-01-14 – 2020-01-19 (×6): 40 mg via INTRAVENOUS
  Filled 2020-01-14 (×6): qty 40

## 2020-01-14 MED ORDER — ACETAMINOPHEN 325 MG PO TABS
650.0000 mg | ORAL_TABLET | ORAL | Status: DC | PRN
  Administered 2020-01-20 – 2020-01-25 (×6): 650 mg via ORAL
  Filled 2020-01-14 (×7): qty 2

## 2020-01-14 MED ORDER — DEXTROSE-NACL 5-0.45 % IV SOLN: INTRAVENOUS | Status: DC

## 2020-01-14 MED ORDER — STERILE WATER FOR INJECTION IV SOLN
INTRAVENOUS | Status: DC
  Filled 2020-01-14 (×2): qty 850

## 2020-01-14 MED ORDER — SODIUM CHLORIDE 0.9% FLUSH: 3.0000 mL | Freq: Once | INTRAVENOUS | Status: DC

## 2020-01-14 MED ORDER — DEXTROSE 50 % IV SOLN: 0.0000 mL | INTRAVENOUS | Status: DC | PRN

## 2020-01-14 MED ORDER — INSULIN REGULAR(HUMAN) IN NACL 100-0.9 UT/100ML-% IV SOLN
INTRAVENOUS | Status: DC
  Administered 2020-01-14: 13 [IU]/h via INTRAVENOUS
  Administered 2020-01-15: 5.5 [IU]/h via INTRAVENOUS
  Administered 2020-01-15: 8.5 [IU]/h via INTRAVENOUS
  Administered 2020-01-15: 7 [IU]/h via INTRAVENOUS
  Administered 2020-01-15: 10.5 [IU]/h via INTRAVENOUS
  Administered 2020-01-15: 5.5 [IU]/h via INTRAVENOUS
  Administered 2020-01-15: 6.5 [IU]/h via INTRAVENOUS
  Administered 2020-01-15: 7.5 [IU]/h via INTRAVENOUS
  Administered 2020-01-15: 5 [IU]/h via INTRAVENOUS
  Administered 2020-01-15: 11.5 [IU]/h via INTRAVENOUS
  Administered 2020-01-16: 18:00:00 10 [IU]/h via INTRAVENOUS
  Administered 2020-01-16: 8 [IU]/h via INTRAVENOUS
  Administered 2020-01-17: 4.8 [IU]/h via INTRAVENOUS
  Administered 2020-01-17: 05:00:00 9.5 [IU]/h via INTRAVENOUS
  Administered 2020-01-18: 5.5 [IU]/h via INTRAVENOUS
  Filled 2020-01-14 (×9): qty 100

## 2020-01-14 MED ORDER — POTASSIUM CHLORIDE CRYS ER 20 MEQ PO TBCR
40.0000 meq | EXTENDED_RELEASE_TABLET | Freq: Once | ORAL | Status: DC
  Filled 2020-01-14: qty 2

## 2020-01-14 MED ORDER — ONDANSETRON HCL 4 MG/2ML IJ SOLN: 4.0000 mg | Freq: Four times a day (QID) | INTRAMUSCULAR | Status: DC | PRN

## 2020-01-14 MED ORDER — SODIUM CHLORIDE 0.9 % IV SOLN: INTRAVENOUS | Status: DC

## 2020-01-14 MED ORDER — DEXTROSE-NACL 5-0.45 % IV SOLN
INTRAVENOUS | Status: DC
  Administered 2020-01-16: 16:00:00 1000 mL via INTRAVENOUS
  Administered 2020-01-16: 11:00:00 950 mL via INTRAVENOUS

## 2020-01-14 MED ORDER — LORAZEPAM 2 MG/ML IJ SOLN
1.0000 mg | Freq: Once | INTRAMUSCULAR | Status: AC
  Administered 2020-01-14: 1 mg via INTRAVENOUS
  Filled 2020-01-14: qty 1

## 2020-01-14 MED ORDER — SODIUM CHLORIDE 0.9 % IV SOLN: INTRAVENOUS | Status: AC

## 2020-01-14 MED ORDER — LACTATED RINGERS IV BOLUS: 1000.0000 mL | INTRAVENOUS | Status: AC

## 2020-01-14 MED ORDER — VANCOMYCIN HCL 1500 MG/300ML IV SOLN: 1500.0000 mg | INTRAVENOUS | Status: DC

## 2020-01-14 MED ORDER — LORAZEPAM 2 MG/ML IJ SOLN
1.0000 mg | Freq: Once | INTRAMUSCULAR | Status: AC
  Administered 2020-01-14: 1 mg via INTRAMUSCULAR
  Filled 2020-01-14: qty 1

## 2020-01-14 MED ORDER — INSULIN REGULAR(HUMAN) IN NACL 100-0.9 UT/100ML-% IV SOLN: INTRAVENOUS | Status: DC

## 2020-01-14 MED ORDER — SODIUM CHLORIDE 0.9 % IV BOLUS
1000.0000 mL | Freq: Once | INTRAVENOUS | Status: AC
  Administered 2020-01-14: 1000 mL via INTRAVENOUS

## 2020-01-14 MED ORDER — VANCOMYCIN HCL 10 G IV SOLR
2500.0000 mg | Freq: Once | INTRAVENOUS | Status: AC
  Administered 2020-01-15: 2500 mg via INTRAVENOUS
  Filled 2020-01-14: qty 2500

## 2020-01-14 MED ORDER — POTASSIUM CHLORIDE 10 MEQ/100ML IV SOLN
10.0000 meq | INTRAVENOUS | Status: AC
  Administered 2020-01-14 – 2020-01-15 (×3): 10 meq via INTRAVENOUS
  Filled 2020-01-14 (×2): qty 100

## 2020-01-14 MED ORDER — SODIUM CHLORIDE 0.9 % IV BOLUS
2000.0000 mL | Freq: Once | INTRAVENOUS | Status: AC
  Administered 2020-01-14: 2000 mL via INTRAVENOUS

## 2020-01-14 MED ORDER — PIPERACILLIN-TAZOBACTAM 3.375 G IVPB 30 MIN
3.3750 g | Freq: Once | INTRAVENOUS | Status: AC
  Administered 2020-01-14: 3.375 g via INTRAVENOUS
  Filled 2020-01-14: qty 50

## 2020-01-14 NOTE — ED Notes (Addendum)
Came into pt's room and found saline all in the floor and that pt had removed both IV's and all lines. Provider ware. Pt notably difficult stick. Stuck pt 3 times and unable to find line. IV team consult placed again due to pt needing previous ultrasound

## 2020-01-14 NOTE — ED Notes (Signed)
Insulin changed to 8 units/hr due to verbal MD order at bedside

## 2020-01-14 NOTE — Progress Notes (Signed)
Pharmacy Antibiotic Note  Glenn Murphy is a 44 y.o. male admitted on 01/14/2020 with sepsis.  Pharmacy has been consulted for Zosyn and Vancomycin dosing.   Height: 5\' 9"  (175.3 cm) Weight: (!) 140.6 kg (310 lb) IBW/kg (Calculated) : 70.7  Temp (24hrs), Avg:97.7 F (36.5 C), Min:97.7 F (36.5 C), Max:97.7 F (36.5 C)  Recent Labs  Lab 01/14/20 1519  WBC 26.1*  CREATININE 2.04*    Estimated Creatinine Clearance: 63.2 mL/min (A) (by C-G formula based on SCr of 2.04 mg/dL (H)).    Allergies  Allergen Reactions  . Other Other (See Comments)    Itchy throat-strawberries,watermelon    Antimicrobials this admission: 4/6 Zosyn >>  4/6 Vancomycin >>   Dose adjustments this admission: N/a  Microbiology results: Pending   Plan:  - Zosyn 3.375g IV x 1 dose over 30 min followed by Zosyn 3.375g IV every 8 hours infused over 4 hours  - Vancomycin 2500 mg IV x 1 dose  - Followed by Vancomycin 1500 mg IV q24h - Est Calc AUC 510 - Monitor patients renal function with urine output and adjust vancomycin dosing as recovers from AKI - De-escalate ABX when appropriate   Thank you for allowing pharmacy to be a part of this patient's care.  6/6 PharmD. BCPS 01/14/2020 9:55 PM

## 2020-01-14 NOTE — ED Triage Notes (Addendum)
Pt comes in with 10 day hematauria and loss of appetite, was taken off acutane at that time. Pt also c/o 6 days of shortness of breath with a "small amount" of cough and chest pain, pain upon sitting down. 1 episode of nausea one week ago, denies dizziness, committing.

## 2020-01-14 NOTE — H&P (Signed)
NAME:  Glenn Murphy, MRN:  [id number], DOB:  [birth year], LOS: 0 ADMISSION DATE:  01/14/2020, CONSULTATION DATE: 01/14/2020 REFERRING MD: Redge Gainer, ED, CHIEF COMPLAINT: Hurting all over  Brief History   Patient is a 44 year old male with lifelong history of hidradenitis brought to the emergency room with hyperglycemia, anion gap acidosis.  History of present illness   Patient is a 44 year old male with a lifelong history of hidradenitis who in February was placed on Accutane and prednisone.  Prior to that he been on continuous antibiotics most recently doxycycline for about a year.  On an evaluation at [facility] on 326 he was having blurry vision felt secondary to Accutane with little clinical improvement along with loss of appetite.  The Accutane was stopped on that visit.  Notes on that visit indicate he was started on Humira but it is not clear as to whether he has been taking it or not.  He was also started on clindamycin gel.  He also was started on Metformin in February. On evaluation in the emergency room the patient complained of thirst and hurting all over.  Lab results showed a sodium of 124 with a bicarb less than 7 glucose of 734 creatinine of 2.04.  He also had a white count of 26.1 hemoglobin 14.  ABG shows a pH of 7.024 with PCO2 of 23 PO2 of 108 with a bicarbonate of 6.  Patient does not complain of dyspnea and is able to speak in full sentences although he is delirious.  Currently blood pressure stable at 138/85.  He is receiving his first liter of normal saline currently has been placed on insulin drip.  Past Medical History    Significant Hospital Events   Admission to [facility] intensive care unit  Consults:  PCCM Patient may require dermatology consult in the morning  Procedures:  NA  Significant Diagnostic Tests:  NA  Micro Data:  Blood cultures and urinalysis urine culture are pending  Antimicrobials:  Empiric Zosyn and  vancomycin  Interim history/subjective:  NA  Objective   Blood pressure (!) 157/87, pulse (!) 112, temperature 97.7 F (36.5 C), temperature source Rectal, resp. rate (!) 27, height 5\' 9"  (1.753 m), weight (!) 140.6 kg, SpO2 97 %.       No intake or output data in the 24 hours ending 01/14/20 2131 Filed Weights   01/14/20 1508  Weight: (!) 140.6 kg    Examination: General: Obese black male uncomfortable but in no acute distress HENT: Within normal limits Lungs: Clear Cardiovascular: Tachycardic Abdomen: Rotund benign Extremities: Within normal limits Neuro: Nonfocal but disoriented GU: N/A Skin: Patient has multiple areas of chronic hidradenitis scars in his armpits groin area along his back.  There is a separative lesion high on his back.  He has 2 small draining lesions along the cleft of his buttocks  Resolved Hospital Problem list   NA  Assessment & Plan:  1.  HHS: Patient has an elevated blood sugar most likely secondary to his suppurative hidradenitis in combination with recent steroid use and a body habitus with propensity to develop type 2 diabetes.  We will hydrate and judiciously reduce his blood sugar.  I am concerned in this gentleman that he is significantly volume depleted and needs good volume replacement before drive his blood sugar down to normal.  Patient does have elevated beta hydroxybutyrate.  2.  Sepsis: This is manifest by severe lactic acidosis elevated creatinine elevated white count with suppurative  hidradenitis as a source.  We will hydrate pressors if needed IV Zosyn and Vanco.  3.  Acute kidney injury versus profound dehydration: Will volume replete and monitor creatinine  4.  Anion gap acidosis as noted above: Patient seems comfortable currently despite his profound acidosis.  We will keep a close eye on him in the ICU with intubation if necessary.  5.  Hidradenitis suppurativa: We will acutely addressed with IV antibiotics  6.  Hypokalemia: As  above  Best practice:  Diet: N.p.o. for now Pain/Anxiety/Delirium protocol (if indicated): N/A VAP protocol (if indicated): N/A DVT prophylaxis: N/A GI prophylaxis: N/A Glucose control: Per protocol Mobility: Bedrest Code Status: Full Family Communication: With wife Disposition: To ICU for treatment of his hyperglycemia close monitoring  Labs   CBC: Recent Labs  Lab 01/14/20 1519 01/14/20 2028  WBC 26.1*  --   HGB 14.0 15.0  HCT 40.6 44.0  MCV 95.3  --   PLT 203  --     Basic Metabolic Panel: Recent Labs  Lab 01/14/20 1519 01/14/20 2028  NA 124* 126*  K 3.1* 2.9*  CL 93*  --   CO2 <7*  --   GLUCOSE 734*  --   BUN 39*  --   CREATININE 2.04*  --   CALCIUM 9.0  --    GFR: Estimated Creatinine Clearance: 63.2 mL/min (A) (by C-G formula based on SCr of 2.04 mg/dL (H)). Recent Labs  Lab 01/14/20 1519  WBC 26.1*    Liver Function Tests: Recent Labs  Lab 01/14/20 1930  AST 29  ALT 28  ALKPHOS 185*  BILITOT 3.3*  PROT 8.9*  ALBUMIN 2.5*   Recent Labs  Lab 01/14/20 1930  LIPASE 40   No results for input(s): AMMONIA in the last 168 hours.  ABG    Component Value Date/Time   HCO3 6.1 (L) 01/14/2020 2028   TCO2 7 (L) 01/14/2020 2028   ACIDBASEDEF 23.0 (H) 01/14/2020 2028   O2SAT 95.0 01/14/2020 2028     Coagulation Profile: No results for input(s): INR, PROTIME in the last 168 hours.  Cardiac Enzymes: No results for input(s): CKTOTAL, CKMB, CKMBINDEX, TROPONINI in the last 168 hours.  HbA1C: Hgb A1c MFr Bld  Date/Time Value Ref Range Status  01/26/2016 02:48 PM 5.9 (H) 4.8 - 5.6 % Final    Comment:    (NOTE)         Pre-diabetes: 5.7 - 6.4         Diabetes: >6.4         Glycemic control for adults with diabetes: <7.0     CBG: Recent Labs  Lab 01/14/20 1730 01/14/20 2001 01/14/20 2116  GLUCAP >600* >600* 593*    Review of Systems:   Patient really only complains of hurting all over.  He is to disoriented to address any other  complaints.  Past Medical History  He,  has a past medical history of Abscess, Arthritis, Diabetes mellitus without complication (HCC), Hypertension, and Obesity.   Surgical History    Past Surgical History:  Procedure Laterality Date  . CYST EXCISION N/A 01/28/2016   Procedure: EXCISION SEBACEOUS CYST FOREHEAD X 2;  Surgeon: [name], MD;  Location: [facility] OR;  Service: General;  Laterality: N/A;  . CYST REMOVAL TRUNK Left 01/28/2016   Procedure: EXCISION SEBACEOUS CYST LEFT SHOULDER AND MID-BACK;  Surgeon: [name], MD;  Location: [facility] OR;  Service: General;  Laterality: Left;  . HYDRADENITIS EXCISION Right 01/28/2016   Procedure: EXCISION HIDRADENITIS  RIGHT AXILLA ;  Surgeon: Donnie Mesa, MD;  Location: [place];  Service: General;  Laterality: Right;  . HYDRADENITIS EXCISION N/A 01/28/2016   Procedure: EXCISION HIDRADENITIS POSTERIOR NECK ;  Surgeon: Donnie Mesa, MD;  Location: [place];  Service: General;  Laterality: N/A;     Social History   reports that he has been smoking cigarettes. He has been smoking about 0.50 packs per day. He does not have any smokeless tobacco history on file. He reports current alcohol use. He reports current drug use. Frequency: 2.00 times per week. Drug: Marijuana.   Family History   His family history includes Cancer in his father.   Allergies Allergies  Allergen Reactions  . Other Other (See Comments)    Itchy throat-strawberries,watermelon     Home Medications  Prior to Admission medications   Medication Sig Start Date End Date Taking? Authorizing Provider  allopurinol (ZYLOPRIM) 300 MG tablet Take 300 mg by mouth daily. 11/16/15   [provider]  clindamycin (CLEOCIN) 300 MG capsule Take 300 mg by mouth 2 (two) times daily. 12/19/15   [provider]  gabapentin (NEURONTIN) 400 MG capsule Take 400 mg by mouth 4 (four) times daily. 12/19/15   [provider]  HYDROmorphone (DILAUDID) 2 MG tablet Take 1 tablet (2 mg  total) by mouth every 4 (four) hours as needed for moderate pain or severe pain. 01/28/16   Donnie Mesa, MD  lidocaine (LIDODERM) 5 % Place 1 patch onto the skin daily. Remove & Discard patch within 12 hours or as directed by MD 08/09/16   Melony Overly, MD  LORazepam (ATIVAN) 1 MG tablet Take 1 mg by mouth at bedtime. 12/19/15   [provider]  metFORMIN (GLUCOPHAGE) 1000 MG tablet Take 1,000 mg by mouth daily. 11/16/15   [provider]  metoprolol succinate (TOPROL-XL) 25 MG 24 hr tablet Take 25 mg by mouth daily. 12/19/15   [provider]  naproxen (NAPROSYN) 500 MG tablet Take 1 tablet (500 mg total) by mouth 2 (two) times daily with a meal. 05/27/15   Kindl, Nelda Severe, MD  predniSONE (DELTASONE) 50 MG tablet Take 1 pill daily for 5 days. 08/09/16   Melony Overly, MD  rifampin (RIFADIN) 300 MG capsule Take 300 mg by mouth 2 (two) times daily. 12/19/15   [provider]  triamcinolone cream (KENALOG) 0.1 % Apply 1 application topically 2 (two) times daily. 12/19/15   [provider]  triamterene-hydrochlorothiazide (MAXZIDE-25) 37.5-25 MG tablet Take 1 tablet by mouth daily. 11/16/15   [provider]     Critical care time: Over 35 minutes spent in chart review bedside evaluation critical care planning

## 2020-01-14 NOTE — ED Provider Notes (Signed)
[facility] Provider [facility]   CSN: [id number] Arrival date & time: 01/14/20  1426     History Chief Complaint  Patient presents with  . Weakness  . Shortness of Breath  . Hematuria    Glenn BollmanDavid M Rhoda is a 44 y.o. male with a history of tobacco abuse, hypertension, obesity, and T2DM who presents to the ED with multiple complaints.  History is primarily provided by patient's wife via telephone with his consent as patient is having shortness of breath leading to difficulty providing history.  She states that for the past 10 days he has generally not felt well, he has had weakness, fatigue, poor appetite with poor p.o. intake, shortness of breath, dry cough, generalized abdominal discomfort, and urinary symptoms including dysuria, urgency, frequency, and hematuria. Hematuria initially was fairly minimal but has significantly increased, they are sure this is coming from the urethra as opposed to the rectal area.  He has not had a bowel movement in a couple of days.  No prior diarrhea, melena, or hematochezia noted.  Of note 10 days prior patient was seen by his dermatology office, he had been taking Accutane and steroids for the past 1 month and had some issues with vision and cheilitis, ultimately decision was made to discontinue the Accutane.  Per dermatology note the recommendation was for steroids and Humira with topical antibiotics, however patient did not start any of these medications.  Per his wife he had been taking his other medications as prescribed including his diabetes medicine until about 3 to 4 days ago when he was having trouble taking them secondary to lack of appetite.  Discussed with patient who confirms above history. He is currently very thirsty. He denies fever, chest pain, nausea, or vomiting.  Level 5 caveat applies secondary to acuity of condition.   HPI     Past Medical History:  Diagnosis Date  . Abscess    recurrent infections    . Arthritis   . Diabetes mellitus without complication (HCC)   . Hypertension   . Obesity     Patient Active Problem List   Diagnosis Date Noted  . Abscess 05/26/2014    Past Surgical History:  Procedure Laterality Date  . CYST EXCISION N/A 01/28/2016   Procedure: EXCISION SEBACEOUS CYST FOREHEAD X 2;  Surgeon: Manus RuddMatthew Tsuei, MD;  Location: [facility] OR;  Service: General;  Laterality: N/A;  . CYST REMOVAL TRUNK Left 01/28/2016   Procedure: EXCISION SEBACEOUS CYST LEFT SHOULDER AND MID-BACK;  Surgeon: Manus RuddMatthew Tsuei, MD;  Location: [facility] OR;  Service: General;  Laterality: Left;  . HYDRADENITIS EXCISION Right 01/28/2016   Procedure: EXCISION HIDRADENITIS RIGHT AXILLA ;  Surgeon: Manus RuddMatthew Tsuei, MD;  [facility];  Service: General;  Laterality: Right;  . HYDRADENITIS EXCISION N/A 01/28/2016   Procedure: EXCISION HIDRADENITIS POSTERIOR NECK ;  Surgeon: Manus RuddMatthew Tsuei, MD;  Location: [facility] OR;  Service: General;  Laterality: N/A;       Family History  Problem Relation Age of Onset  . Cancer Father     Social History   Tobacco Use  . Smoking status: Current Every Day Smoker    Packs/day: 0.50    Types: Cigarettes  Substance Use Topics  . Alcohol use: Yes    Comment: 40oz beer a day  fifth of liquor over  q 3-4 days  . Drug use: Yes    Frequency: 2.0 times per week    Types: Marijuana    Comment: 01/26/16  Home Medications Prior to Admission medications   Medication Sig Start Date End Date Taking? Authorizing Provider  allopurinol (ZYLOPRIM) 300 MG tablet Take 300 mg by mouth daily. 11/16/15   [provider]  clindamycin (CLEOCIN) 300 MG capsule Take 300 mg by mouth 2 (two) times daily. 12/19/15   [provider]  gabapentin (NEURONTIN) 400 MG capsule Take 400 mg by mouth 4 (four) times daily. 12/19/15   [provider]  HYDROmorphone (DILAUDID) 2 MG tablet Take 1 tablet (2 mg total) by mouth every 4 (four) hours as needed for moderate pain or severe pain.  01/28/16   [name], MD  lidocaine (LIDODERM) 5 % Place 1 patch onto the skin daily. Remove & Discard patch within 12 hours or as directed by MD 08/09/16   Charm Rings, MD  LORazepam (ATIVAN) 1 MG tablet Take 1 mg by mouth at bedtime. 12/19/15   [provider]  metFORMIN (GLUCOPHAGE) 1000 MG tablet Take 1,000 mg by mouth daily. 11/16/15   [provider]  metoprolol succinate (TOPROL-XL) 25 MG 24 hr tablet Take 25 mg by mouth daily. 12/19/15   [provider]  naproxen (NAPROSYN) 500 MG tablet Take 1 tablet (500 mg total) by mouth 2 (two) times daily with a meal. 05/27/15   Kindl, Quita Skye, MD  predniSONE (DELTASONE) 50 MG tablet Take 1 pill daily for 5 days. 08/09/16   Charm Rings, MD  rifampin (RIFADIN) 300 MG capsule Take 300 mg by mouth 2 (two) times daily. 12/19/15   [provider]  triamcinolone cream (KENALOG) 0.1 % Apply 1 application topically 2 (two) times daily. 12/19/15   [provider]  triamterene-hydrochlorothiazide (MAXZIDE-25) 37.5-25 MG tablet Take 1 tablet by mouth daily. 11/16/15   [provider]    Allergies    Other  Review of Systems   Review of Systems  Constitutional: Positive for fatigue. Negative for chills and fever.  Respiratory: Positive for cough and shortness of breath.   Cardiovascular: Negative for chest pain.  Gastrointestinal: Positive for abdominal pain and constipation. Negative for anal bleeding, blood in stool, diarrhea, nausea and vomiting.  Endocrine: Positive for polydipsia and polyuria.  Genitourinary: Positive for dysuria, frequency and urgency.  Neurological: Positive for weakness.  All other systems reviewed and are negative.   Physical Exam Updated Vital Signs BP (!) 175/146   Pulse (!) 111   Resp (!) 35   Ht 5\' 9"  (1.753 m)   Wt (!) 140.6 kg   SpO2 98%   BMI 45.78 kg/m   Physical Exam Vitals and nursing note reviewed.  Constitutional:      General: He is in acute  distress.     Appearance: He is ill-appearing.  HENT:     Head: Normocephalic and atraumatic.     Mouth/Throat:     Comments: Dry mucous membranes.  Eyes:     Conjunctiva/sclera: Conjunctivae normal.     Comments: PERRL.   Cardiovascular:     Rate and Rhythm: Regular rhythm. Tachycardia present.     Pulses:          Radial pulses are 2+ on the right side and 2+ on the left side.  Pulmonary:     Effort: Tachypnea present.     Breath sounds: No wheezing, rhonchi or rales.     Comments: Speaking in very short sentences secondary to dyspnea. Abdominal:     Tenderness: There is generalized abdominal tenderness. There is no guarding.  Musculoskeletal:  Cervical back: No rigidity.  Skin:    General: Skin is warm.  Neurological:     Mental Status: He is alert.     Comments: Alert.  Clear speech.  Moving all extremities.  Psychiatric:        Mood and Affect: Mood is anxious.        Behavior: Behavior is agitated.    ED Results / Procedures / Treatments   Labs (all labs ordered are listed, but only abnormal results are displayed) Labs Reviewed  BASIC METABOLIC PANEL - Abnormal; Notable for the following components:      Result Value   Sodium 124 (*)    Potassium 3.1 (*)    Chloride 93 (*)    CO2 <7 (*)    Glucose, Bld 734 (*)    BUN 39 (*)    Creatinine, Ser 2.04 (*)    GFR calc non Af Amer 38 (*)    GFR calc Af Amer 44 (*)    All other components within normal limits  CBC - Abnormal; Notable for the following components:   WBC 26.1 (*)    All other components within normal limits  BETA-HYDROXYBUTYRIC ACID - Abnormal; Notable for the following components:   Beta-Hydroxybutyric Acid >8.00 (*)    All other components within normal limits  BRAIN NATRIURETIC PEPTIDE - Abnormal; Notable for the following components:   B Natriuretic Peptide 264.7 (*)    All other components within normal limits  HEPATIC FUNCTION PANEL - Abnormal; Notable for the following components:    Total Protein 8.9 (*)    Albumin 2.5 (*)    Alkaline Phosphatase 185 (*)    Total Bilirubin 3.3 (*)    Bilirubin, Direct 1.4 (*)    Indirect Bilirubin 1.9 (*)    All other components within normal limits  CBC - Abnormal; Notable for the following components:   WBC 27.5 (*)    All other components within normal limits  LACTIC ACID, PLASMA - Abnormal; Notable for the following components:   Lactic Acid, Venous 4.2 (*)    All other components within normal limits  BASIC METABOLIC PANEL - Abnormal; Notable for the following components:   Sodium 126 (*)    Potassium 3.2 (*)    Chloride 94 (*)    CO2 <7 (*)    Glucose, Bld 671 (*)    BUN 54 (*)    Creatinine, Ser 2.76 (*)    GFR calc non Af Amer 26 (*)    GFR calc Af Amer 31 (*)    All other components within normal limits  CBG MONITORING, ED - Abnormal; Notable for the following components:   Glucose-Capillary >600 (*)    All other components within normal limits  CBG MONITORING, ED - Abnormal; Notable for the following components:   Glucose-Capillary >600 (*)    All other components within normal limits  POCT I-STAT EG7 - Abnormal; Notable for the following components:   pH, Ven 7.021 (*)    pCO2, Ven 23.6 (*)    pO2, Ven 108.0 (*)    Bicarbonate 6.1 (*)    TCO2 7 (*)    Acid-base deficit 23.0 (*)    Sodium 126 (*)    Potassium 2.9 (*)    All other components within normal limits  CBG MONITORING, ED - Abnormal; Notable for the following components:   Glucose-Capillary 593 (*)    All other components within normal limits  CBG MONITORING, ED - Abnormal; Notable for the  following components:   Glucose-Capillary 525 (*)    All other components within normal limits  RESPIRATORY PANEL BY RT PCR (FLU A&B, COVID)  URINE CULTURE  CULTURE, BLOOD (ROUTINE X 2)  CULTURE, BLOOD (ROUTINE X 2)  LIPASE, BLOOD  URINALYSIS, ROUTINE W REFLEX MICROSCOPIC  BETA-HYDROXYBUTYRIC ACID  LACTIC ACID, PLASMA  CBC  MAGNESIUM  PHOSPHORUS    BETA-HYDROXYBUTYRIC ACID  BASIC METABOLIC PANEL  BASIC METABOLIC PANEL  BASIC METABOLIC PANEL  HEMOGLOBIN A1C  PROCALCITONIN  HIV ANTIBODY (ROUTINE TESTING W REFLEX)  BASIC METABOLIC PANEL  I-STAT VENOUS BLOOD GAS, ED    EKG EKG Interpretation  Date/Time:  Tuesday January 14 2020 15:03:13 EDT Ventricular Rate:  120 PR Interval:  150 QRS Duration: 92 QT Interval:  332 QTC Calculation: 469 R Axis:   42 Text Interpretation: Sinus tachycardia Nonspecific ST and T wave abnormality Artifact Abnormal ECG Confirmed by Gerhard Munch [phone]) on 01/14/2020 5:32:14 PM   Radiology DG Chest 2 View  Result Date: 01/14/2020 CLINICAL DATA:  Shortness of breath. EXAM: CHEST - 2 VIEW COMPARISON:  None. FINDINGS: The heart size and mediastinal contours are within normal limits. Both lungs are clear. No pneumothorax or pleural effusion is noted. The visualized skeletal structures are unremarkable. IMPRESSION: No active cardiopulmonary disease. Electronically Signed   By: Lupita Raider M.D.   On: 01/14/2020 15:38    Procedures .Critical Care Performed by: Cherly Anderson, PA-C Authorized by: Cherly Anderson, PA-C      CRITICAL CARE Performed by: Harvie Heck   Total critical care time: 50 minutes  Critical care time was exclusive of separately billable procedures and treating other patients.  Critical care was necessary to treat or prevent imminent or life-threatening deterioration.  Critical care was time spent personally by me on the following activities: development of treatment plan with patient and/or surrogate as well as nursing, discussions with consultants, evaluation of patient's response to treatment, examination of patient, obtaining history from patient or surrogate, ordering and performing treatments and interventions, ordering and review of laboratory studies, ordering and review of radiographic studies, pulse oximetry and re-evaluation of patient's  condition.  (including critical care time)  Medications Ordered in ED Medications  albuterol (PROVENTIL) (2.5 MG/3ML) 0.083% nebulizer solution 5 mg (5 mg Nebulization Not Given 01/14/20 1719)  sodium chloride flush (NS) 0.9 % injection 3 mL (has no administration in time range)    ED Course  I have reviewed the triage vital signs and the nursing notes.  Pertinent labs & imaging results that were available during my care of the patient were reviewed by me and considered in my medical decision making (see chart for details).    Glenn Murphy was evaluated in Emergency Department on 01/14/2020 for the symptoms described in the history of present illness. He/she was evaluated in the context of the global COVID-19 pandemic, which necessitated consideration that the patient might be at risk for infection with the SARS-CoV-2 virus that causes COVID-19. Institutional protocols and algorithms that pertain to the evaluation of patients at risk for COVID-19 are in a state of rapid change based on information released by regulatory bodies including the CDC and federal and state organizations. These policies and algorithms were followed during the patient's care in the ED.  MDM Rules/Calculators/A&P                      Patient presents to the emergency department with multiple complaints as detailed above.  He is  ill-appearing, tachypneic, tachycardic, BP elevated.  He does appear to have increased work of breathing however his SPO2 is maintaining greater than 92% on room air.  Lungs are clear.  Abdomen diffusely tender without peritoneal signs. Additional history obtained:  Additional history obtained from patient's wife via telephone. Previous records obtained and reviewed- specifically most recent dermatology note.   Imaging Studies ordered:  CXR ordered per triage- I independently visualized and interpreted imaging which showed no acute process.  No evidence of pneumonia.  No pneumothorax.  No  fluid overload.  Lab Tests:  Labs per triage reviewed, and interpreted labs, which included:  CBG: >600 CBC: Leukocytosis at 26.1.  No anemia. BMP: Hyperglycemia with acidosis.  AKI with doubling of creatinine compared to prior records.  Several electrolyte derangements.  ED course:  Patient quickly staffed with supervising physician Dr. Jeraldine Loots given acuity of condition.  Dr. Jeraldine Loots has placed orders for fluids, insulin drip, potassium, and additional labs during my evaluation of patient which I am in agreement with.  18:45: RE-EVAL: Patient becoming very agitated with nursing staff, removed all monitors and requesting to leave. I was able to redirect patient but he does seem confused, moving all extremities. He states he feels anxious- will give IM ativan. Discussed need for IV placement as soon as possible and initiation of care with nursing staff.   18:49: Afebrile  19:55: Difficulty establishing IV, able to obtain access at this time--> starting fluids NS as opposed to LR given only 1 IV to run with insulin drip.   VBG: significant acidosis with pH 7.021, bicarb 6.1, CO2 7.  20:40: Patient having some mild increase in confusion, he is protecting his airway, does not appear to require emergent intubation at this point in time, continue to monitor.   Will consult critical care for admission for DKA with severe acidosis & encephalopathy with acute kidney injury.  Cause likely from lack of medication, concern for possible UTI as well given his urinary sxs, urine pending- patient dry on exam. He has generalized abdominal tenderness, no peritoneal signs currently, monitor with serial abdominal exams, may be secondary to his DKA, would consider imaging if concerns on serial exams.   22:50: CONSULT: Discussed case with intensivist Dr. Levada Schilling- accepts admission, recommends administration of sodium bicarbonate infusion in sterile water, 1L total, rate of 50-75cc/hr- order placed. Will need  repeat ABG in 4 hours. Critical care team will come to ED to see patient.   Critical care present in the ED and have placed admission orders.   This is a shared visit with supervising physician Dr. Jeraldine Loots who has independently evaluated patient & provided guidance in evaluation/management/disposition, in agreement with care    Critical Interventions:  . Fluids, insulin drip, potassium, bicarb.   Portions of this note were generated with Scientist, clinical (histocompatibility and immunogenetics). Dictation errors may occur despite best attempts at proofreading.  Final Clinical Impression(s) / ED Diagnoses Final diagnoses:  Diabetic ketoacidosis without coma associated with type 2 diabetes mellitus (HCC)  Metabolic encephalopathy  AKI (acute kidney injury) [facility])    Rx / DC Orders ED Discharge Orders    None       Desmond Lope 01/14/20 2352    Gerhard Munch, MD 01/14/20 2358

## 2020-01-14 NOTE — ED Notes (Signed)
Pt given Ativan due to increased anxiety at agitation. Some confusion noted as well. Provider informed and aware. Pt placed in a hospital bed for comfort

## 2020-01-15 ENCOUNTER — Inpatient Hospital Stay (HOSPITAL_COMMUNITY): Payer: BLUE CROSS/BLUE SHIELD

## 2020-01-15 DIAGNOSIS — E872 Acidosis: Secondary | ICD-10-CM

## 2020-01-15 DIAGNOSIS — E1110 Type 2 diabetes mellitus with ketoacidosis without coma: Secondary | ICD-10-CM | POA: Diagnosis present

## 2020-01-15 DIAGNOSIS — E1111 Type 2 diabetes mellitus with ketoacidosis with coma: Secondary | ICD-10-CM

## 2020-01-15 LAB — URINALYSIS, ROUTINE W REFLEX MICROSCOPIC
Bilirubin Urine: NEGATIVE
Glucose, UA: 50 mg/dL — AB
Ketones, ur: 5 mg/dL — AB
Nitrite: NEGATIVE
Protein, ur: 100 mg/dL — AB
RBC / HPF: >50 RBC/hpf — ABNORMAL HIGH (ref 0–5)
RBC / HPF: >50 RBC/hpf — ABNORMAL HIGH (ref 0–5)
Specific Gravity, Urine: 1.015 (ref 1.005–1.030)
WBC, UA: >50 WBC/hpf — ABNORMAL HIGH (ref 0–5)
WBC, UA: >50 WBC/hpf — ABNORMAL HIGH (ref 0–5)
pH: 6 (ref 5.0–8.0)

## 2020-01-15 LAB — BASIC METABOLIC PANEL
Anion gap: 18 — ABNORMAL HIGH (ref 5–15)
Anion gap: 19 — ABNORMAL HIGH (ref 5–15)
Anion gap: 16 — ABNORMAL HIGH (ref 5–15)
Anion gap: 17 — ABNORMAL HIGH (ref 5–15)
Anion gap: 17 — ABNORMAL HIGH (ref 5–15)
Anion gap: 14 (ref 5–15)
BUN: 57 mg/dL — ABNORMAL HIGH (ref 6–20)
BUN: 59 mg/dL — ABNORMAL HIGH (ref 6–20)
BUN: 62 mg/dL — ABNORMAL HIGH (ref 6–20)
BUN: 65 mg/dL — ABNORMAL HIGH (ref 6–20)
BUN: 66 mg/dL — ABNORMAL HIGH (ref 6–20)
BUN: 65 mg/dL — ABNORMAL HIGH (ref 6–20)
CO2: 10 mmol/L — ABNORMAL LOW (ref 22–32)
CO2: 11 mmol/L — ABNORMAL LOW (ref 22–32)
CO2: 11 mmol/L — ABNORMAL LOW (ref 22–32)
CO2: 10 mmol/L — ABNORMAL LOW (ref 22–32)
CO2: 11 mmol/L — ABNORMAL LOW (ref 22–32)
CO2: 11 mmol/L — ABNORMAL LOW (ref 22–32)
Calcium: 8.5 mg/dL — ABNORMAL LOW (ref 8.9–10.3)
Calcium: 8.4 mg/dL — ABNORMAL LOW (ref 8.9–10.3)
Calcium: 8.3 mg/dL — ABNORMAL LOW (ref 8.9–10.3)
Calcium: 8.1 mg/dL — ABNORMAL LOW (ref 8.9–10.3)
Calcium: 8 mg/dL — ABNORMAL LOW (ref 8.9–10.3)
Calcium: 8 mg/dL — ABNORMAL LOW (ref 8.9–10.3)
Chloride: 101 mmol/L (ref 98–111)
Chloride: 99 mmol/L (ref 98–111)
Chloride: 101 mmol/L (ref 98–111)
Chloride: 101 mmol/L (ref 98–111)
Chloride: 101 mmol/L (ref 98–111)
Chloride: 103 mmol/L (ref 98–111)
Creatinine, Ser: 2.3 mg/dL — ABNORMAL HIGH (ref 0.61–1.24)
Creatinine, Ser: 2.34 mg/dL — ABNORMAL HIGH (ref 0.61–1.24)
Creatinine, Ser: 2.3 mg/dL — ABNORMAL HIGH (ref 0.61–1.24)
Creatinine, Ser: 2.32 mg/dL — ABNORMAL HIGH (ref 0.61–1.24)
Creatinine, Ser: 2.3 mg/dL — ABNORMAL HIGH (ref 0.61–1.24)
Creatinine, Ser: 2.32 mg/dL — ABNORMAL HIGH (ref 0.61–1.24)
GFR calc Af Amer: 38 mL/min — ABNORMAL LOW (ref >60)
GFR calc Af Amer: 37 mL/min — ABNORMAL LOW (ref >60)
GFR calc Af Amer: 38 mL/min — ABNORMAL LOW (ref >60)
GFR calc Af Amer: 38 mL/min — ABNORMAL LOW (ref >60)
GFR calc Af Amer: 38 mL/min — ABNORMAL LOW (ref >60)
GFR calc Af Amer: 38 mL/min — ABNORMAL LOW (ref >60)
GFR calc non Af Amer: 33 mL/min — ABNORMAL LOW (ref >60)
GFR calc non Af Amer: 32 mL/min — ABNORMAL LOW (ref >60)
GFR calc non Af Amer: 33 mL/min — ABNORMAL LOW (ref >60)
GFR calc non Af Amer: 32 mL/min — ABNORMAL LOW (ref >60)
GFR calc non Af Amer: 33 mL/min — ABNORMAL LOW (ref >60)
GFR calc non Af Amer: 32 mL/min — ABNORMAL LOW (ref >60)
Glucose, Bld: 249 mg/dL — ABNORMAL HIGH (ref 70–99)
Glucose, Bld: 229 mg/dL — ABNORMAL HIGH (ref 70–99)
Glucose, Bld: 247 mg/dL — ABNORMAL HIGH (ref 70–99)
Glucose, Bld: 267 mg/dL — ABNORMAL HIGH (ref 70–99)
Glucose, Bld: 278 mg/dL — ABNORMAL HIGH (ref 70–99)
Glucose, Bld: 232 mg/dL — ABNORMAL HIGH (ref 70–99)
Potassium: 2.5 mmol/L — CL (ref 3.5–5.1)
Potassium: 2.6 mmol/L — CL (ref 3.5–5.1)
Potassium: 2.9 mmol/L — ABNORMAL LOW (ref 3.5–5.1)
Potassium: 2.8 mmol/L — ABNORMAL LOW (ref 3.5–5.1)
Potassium: 2.8 mmol/L — ABNORMAL LOW (ref 3.5–5.1)
Potassium: 2.8 mmol/L — ABNORMAL LOW (ref 3.5–5.1)
Sodium: 129 mmol/L — ABNORMAL LOW (ref 135–145)
Sodium: 129 mmol/L — ABNORMAL LOW (ref 135–145)
Sodium: 128 mmol/L — ABNORMAL LOW (ref 135–145)
Sodium: 128 mmol/L — ABNORMAL LOW (ref 135–145)
Sodium: 129 mmol/L — ABNORMAL LOW (ref 135–145)
Sodium: 128 mmol/L — ABNORMAL LOW (ref 135–145)

## 2020-01-15 LAB — CBC
HCT: 34.2 % — ABNORMAL LOW (ref 39.0–52.0)
Hemoglobin: 12.4 g/dL — ABNORMAL LOW (ref 13.0–17.0)
MCH: 33 pg (ref 26.0–34.0)
MCHC: 36.3 g/dL — ABNORMAL HIGH (ref 30.0–36.0)
MCV: 91 fL (ref 80.0–100.0)
Platelets: 159 10*3/uL (ref 150–400)
RBC: 3.76 MIL/uL — ABNORMAL LOW (ref 4.22–5.81)
RDW: 13.4 % (ref 11.5–15.5)
WBC: 23.9 10*3/uL — ABNORMAL HIGH (ref 4.0–10.5)
nRBC: 0.1 % (ref 0.0–0.2)

## 2020-01-15 LAB — POCT I-STAT 7, (LYTES, BLD GAS, ICA,H+H)
Acid-base deficit: 13 mmol/L — ABNORMAL HIGH (ref 0.0–2.0)
Bicarbonate: 12.2 mmol/L — ABNORMAL LOW (ref 20.0–28.0)
Calcium, Ion: 1.22 mmol/L (ref 1.15–1.40)
HCT: 34 % — ABNORMAL LOW (ref 39.0–52.0)
Hemoglobin: 11.6 g/dL — ABNORMAL LOW (ref 13.0–17.0)
O2 Saturation: 96 %
Patient temperature: 97.7
Potassium: 2.6 mmol/L — CL (ref 3.5–5.1)
Sodium: 131 mmol/L — ABNORMAL LOW (ref 135–145)
TCO2: 13 mmol/L — ABNORMAL LOW (ref 22–32)
pCO2 arterial: 24.2 mmHg — ABNORMAL LOW (ref 32.0–48.0)
pH, Arterial: 7.308 — ABNORMAL LOW (ref 7.350–7.450)
pO2, Arterial: 89 mmHg (ref 83.0–108.0)

## 2020-01-15 LAB — GLUCOSE, CAPILLARY
Glucose-Capillary: 205 mg/dL — ABNORMAL HIGH (ref 70–99)
Glucose-Capillary: 214 mg/dL — ABNORMAL HIGH (ref 70–99)
Glucose-Capillary: 218 mg/dL — ABNORMAL HIGH (ref 70–99)
Glucose-Capillary: 220 mg/dL — ABNORMAL HIGH (ref 70–99)
Glucose-Capillary: 227 mg/dL — ABNORMAL HIGH (ref 70–99)
Glucose-Capillary: 230 mg/dL — ABNORMAL HIGH (ref 70–99)
Glucose-Capillary: 233 mg/dL — ABNORMAL HIGH (ref 70–99)
Glucose-Capillary: 237 mg/dL — ABNORMAL HIGH (ref 70–99)
Glucose-Capillary: 242 mg/dL — ABNORMAL HIGH (ref 70–99)
Glucose-Capillary: 245 mg/dL — ABNORMAL HIGH (ref 70–99)
Glucose-Capillary: 247 mg/dL — ABNORMAL HIGH (ref 70–99)
Glucose-Capillary: 248 mg/dL — ABNORMAL HIGH (ref 70–99)
Glucose-Capillary: 255 mg/dL — ABNORMAL HIGH (ref 70–99)
Glucose-Capillary: 258 mg/dL — ABNORMAL HIGH (ref 70–99)
Glucose-Capillary: 264 mg/dL — ABNORMAL HIGH (ref 70–99)
Glucose-Capillary: 271 mg/dL — ABNORMAL HIGH (ref 70–99)
Glucose-Capillary: 278 mg/dL — ABNORMAL HIGH (ref 70–99)
Glucose-Capillary: 286 mg/dL — ABNORMAL HIGH (ref 70–99)
Glucose-Capillary: 290 mg/dL — ABNORMAL HIGH (ref 70–99)
Glucose-Capillary: 293 mg/dL — ABNORMAL HIGH (ref 70–99)
Glucose-Capillary: 380 mg/dL — ABNORMAL HIGH (ref 70–99)
Glucose-Capillary: 431 mg/dL — ABNORMAL HIGH (ref 70–99)

## 2020-01-15 LAB — BETA-HYDROXYBUTYRIC ACID
Beta-Hydroxybutyric Acid: 2.73 mmol/L — ABNORMAL HIGH (ref 0.05–0.27)
Beta-Hydroxybutyric Acid: 1.73 mmol/L — ABNORMAL HIGH (ref 0.05–0.27)
Beta-Hydroxybutyric Acid: 0.53 mmol/L — ABNORMAL HIGH (ref 0.05–0.27)

## 2020-01-15 LAB — PHOSPHORUS
Phosphorus: 1.3 mg/dL — ABNORMAL LOW (ref 2.5–4.6)
Phosphorus: 1.4 mg/dL — ABNORMAL LOW (ref 2.5–4.6)

## 2020-01-15 LAB — MAGNESIUM: Magnesium: 2.2 mg/dL (ref 1.7–2.4)

## 2020-01-15 LAB — PROCALCITONIN: Procalcitonin: 47.9 ng/mL

## 2020-01-15 LAB — LACTIC ACID, PLASMA: Lactic Acid, Venous: 2.2 mmol/L (ref 0.5–1.9)

## 2020-01-15 LAB — MRSA PCR SCREENING: MRSA by PCR: NEGATIVE

## 2020-01-15 LAB — HIV ANTIBODY (ROUTINE TESTING W REFLEX): HIV Screen 4th Generation wRfx: NONREACTIVE

## 2020-01-15 LAB — HEMOGLOBIN A1C
Hgb A1c MFr Bld: 11.2 % — ABNORMAL HIGH (ref 4.8–5.6)
Mean Plasma Glucose: 275 mg/dL

## 2020-01-15 MED ORDER — POTASSIUM CHLORIDE 10 MEQ/100ML IV SOLN
INTRAVENOUS | Status: AC
  Administered 2020-01-15: 10 meq via INTRAVENOUS
  Filled 2020-01-15: qty 100

## 2020-01-15 MED ORDER — SODIUM CHLORIDE 0.9 % IV SOLN
2.0000 g | INTRAVENOUS | Status: DC
  Administered 2020-01-15 – 2020-01-16 (×2): 2 g via INTRAVENOUS
  Filled 2020-01-15 (×2): qty 20

## 2020-01-15 MED ORDER — POTASSIUM CHLORIDE 10 MEQ/100ML IV SOLN
10.0000 meq | INTRAVENOUS | Status: AC
  Administered 2020-01-15 (×5): 10 meq via INTRAVENOUS
  Filled 2020-01-15 (×2): qty 100

## 2020-01-15 MED ORDER — CHLORHEXIDINE GLUCONATE CLOTH 2 % EX PADS
6.0000 | MEDICATED_PAD | Freq: Every day | CUTANEOUS | Status: DC
  Administered 2020-01-16 – 2020-01-25 (×8): 6 via TOPICAL

## 2020-01-15 MED ORDER — ORAL CARE MOUTH RINSE
15.0000 mL | Freq: Two times a day (BID) | OROMUCOSAL | Status: DC
  Administered 2020-01-15 – 2020-01-24 (×14): 15 mL via OROMUCOSAL

## 2020-01-15 MED ORDER — WHITE PETROLATUM EX OINT
TOPICAL_OINTMENT | CUTANEOUS | Status: AC
  Administered 2020-01-15: 0.2
  Filled 2020-01-15: qty 28.35

## 2020-01-15 MED ORDER — WHITE PETROLATUM EX OINT
TOPICAL_OINTMENT | CUTANEOUS | Status: AC
  Filled 2020-01-15: qty 28.35

## 2020-01-15 MED ORDER — POTASSIUM PHOSPHATES 15 MMOLE/5ML IV SOLN
30.0000 mmol | Freq: Once | INTRAVENOUS | Status: AC
  Administered 2020-01-15: 30 mmol via INTRAVENOUS
  Filled 2020-01-15: qty 10

## 2020-01-15 MED ORDER — POTASSIUM CHLORIDE 10 MEQ/100ML IV SOLN
INTRAVENOUS | Status: AC
  Filled 2020-01-15: qty 100

## 2020-01-15 MED ORDER — CHLORHEXIDINE GLUCONATE CLOTH 2 % EX PADS: 6.0000 | MEDICATED_PAD | Freq: Every day | CUTANEOUS | Status: DC

## 2020-01-15 NOTE — ED Notes (Signed)
Pt bladder scanned and had in bladder. Obtained verbal order from MD to place foley due to urinary retention. Urine noted to be dark and brown in color

## 2020-01-15 NOTE — Progress Notes (Signed)
Inpatient Diabetes Program Recommendations  AACE/ADA: New Consensus Statement on Inpatient Glycemic Control (2015)  Target Ranges:  Prepandial:   less than 140 mg/dL      Peak postprandial:   less than 180 mg/dL (1-2 hours)      Critically ill patients:  140 - 180 mg/dL   Lab Results  Component Value Date   GLUCAP 242 (H) 01/15/2020   HGBA1C 5.9 (H) 01/26/2016    Review of Glycemic Control  Diabetes history: DM 2 Outpatient Diabetes medications: Metformin 1000 mg Daily Current orders for Inpatient glycemic control: IV insulin Endotool DKA  A1c in process  Glucose 734 on presentation. Pt recently on steroids. Pt will require insulin at time of d/c. Will follow pt this admission and follow glucose trends. Will need insulin teaching.  Thanks,  Christena Deem RN, MSN, BC-ADM Inpatient Diabetes Coordinator Team Pager [phone] (8a-5p)

## 2020-01-15 NOTE — ED Notes (Signed)
Insulin Endotool resumed due to verbal MD orders. Order parameters changed for glucose goal to be 140-200

## 2020-01-15 NOTE — ED Notes (Signed)
Verbal order given by ICU NP to apply bilateral soft wrist restraints due to patient pulling out all lines, BP cuff, and IVs out. Pt diaphoretic and cognition declining. Pt's speech now incoherent. MD informed and aware.

## 2020-01-15 NOTE — ED Notes (Signed)
Report given to 2M RN. All questions answered 

## 2020-01-15 NOTE — Progress Notes (Addendum)
NAME:  Glenn Murphy, MRN:  [id number], DOB:  04/10/1976, LOS: 1 ADMISSION DATE:  01/14/2020, CONSULTATION DATE:  01/14/2020 REFERRING MD:  Redge Gainer, CHIEF COMPLAINT:  "Hurting all over"  Brief History   Patient is a 44 year old male with lifelong history of hidradenitis brought to the emergency room with hyperglycemia, elevated BHB and anion gap metabolic acidosis.  History of present illness   Patient is a 44 year old male with a lifelong history of hidradenitis who in February was placed on Accutane and prednisone.  Prior to that he been on continuous antibiotics most recently doxycycline for about a year.  On an evaluation at [facility] on 3/26 he was having blurry vision felt secondary to Accutane with little clinical improvement along with loss of appetite.  The Accutane was stopped on that visit.  Notes on that visit indicate he was started on Humira but it is not clear as to whether he has been taking it or not.  He was also started on clindamycin gel.  He also was started on Metformin in February.  On evaluation in the emergency room the patient complained of thirst and hurting all over.  Lab results showed a sodium of 124 with a bicarb less than 7, glucose of 734, BHB >8 and creatinine of 2.04.  He also had a white count of 26.1 hemoglobin 14.  ABG showed a pH of 7.024 with PCO2 of 23 PO2 of 108 with a bicarbonate of 6 and anion gap of 23.   Past Medical History  Hidradenitis suppurativa  DM HTN  Significant Hospital Events   4/6: admitted to ICU   Consults:  N/A  Procedures:  N/A  Significant Diagnostic Tests:  CXR 4/6: Lungs clear without pleural effusion.   Micro Data:  Blood Cx 4/6: no growth in <12 hrs Urine Cx 4/7: pending  Antimicrobials:  Vancomycin 2500 mg 4/6>>4/6 Piperacillin-tazobactam 3.375 g 4/6>>  Interim history/subjective:  Patient awake lying in bed with deep and labored breathing. He is unable to answer questions clearly  or follow commands.   Overnight, he remained afebrile and hemodynamically stable. He was bladder scanned and noted to be retaining approximately and subsequently had foley placed. Additionally, improvements in pH made so bicarb drip was discontinued by overnight provider.   Objective   Blood pressure 127/70, pulse (!) 107, temperature 98.3 F (36.8 C), temperature source Axillary, resp. rate (!) 32, height 5\' 9"  (1.753 m), weight 125.6 kg, SpO2 95 %.        Intake/Output Summary (Last 24 hours) at 01/15/2020 1217 Last data filed at 01/15/2020 0745 Gross per 24 hour  Intake 2499.02 ml  Output 1400 ml  Net 1099.02 ml   Filed Weights   01/14/20 1508 01/15/20 0156  Weight: (!) 140.6 kg 125.6 kg    Examination: General: Male, lying awake in bed, appears uncomfortable.  HENT: Normocephalic atraumatic head.  Lungs: Clear to auscultation bilaterally. Kussmaul breathing.  Cardiovascular: Tachycardic. Regular rhythm. No murmurs or gallops.  Abdomen: non-distended and no guarding to palpation. Bowel sounds normoactive.  Extremities: No LEE with 2+ pedal pulses.  Neuro: Awake and able to follow some commands but distractible.  Skin: Multiple 2-3 mm open wound including on the left lower region of the pelvis, upper back and buttocks region that is without erythema and minimal pus drainage. No fluctuance or sinus tract formation.   Resolved Hospital Problem list   None   Assessment & Plan:   DKA:  Pt presented  with glucose of 734, gap acidosis with elevated beta-hydroxybutyric acid of >7.00. Initial ABG showed metabolic acidosis with pH of 7.02 most consistent with DKA, rather than HHS. Current likely trigger for DKA is infection source assumed to be skin versus urinary. Other possible trigger may be recent initiation of Humira for HS.  - BMP q4h  - CBG as directed by EndoTool - Continue insulin gtt - Transition to [place] insulin once gap closes x2 and patient can tolerate PO  - Continue  D5 - 1/2NS until patient tolerating PO - Receiving potassium phosphate infusion, follow up phosphorus tomorrow - Trend beta-hydroxybutyric acid q8h - a1c pending  Leukocytosis:  Pt afebrile but has a markedly elevated WBC of 23.9. Etiology likely from DKA but concern for underlying infection. No growth in bcx and ucx pending. Unlikely to be MRSA or pseudomonas, so will de-escalate antibiotics.   - follow Bcx and Ucx - Switch Pip-tazo/vanc to Ceftriaxone   AKI:  Creatinine elevated at 2.3 and BUN 59 likely from dehydration.  - Daily BMP - Continue IVF   Hidradenitis suppurative ([place]ur[place] Stage 3)  - continue to monitor for signs of infection   Urinary retention Bladder scan revealed over 800 mL of retention. Currently has foley. Etiology may be acute illness versus obstruction - continue foley - renal ultrasound ordered  Best practice:  Diet: NPO Pain/Anxiety/Delirium protocol (if indicated): n/a VAP protocol (if indicated): in place DVT prophylaxis: heparin  GI prophylaxis: pantoprazole  Glucose control: insulin infusion  Mobility: BR Code Status: Full Family Communication: Wife updated at bedside on 4/7 Disposition: ICU  Labs   CBC: Recent Labs  Lab 01/14/20 1519 01/14/20 2028 01/14/20 2225 01/15/20 0330 01/15/20 0629  WBC 26.1*  --  27.5* 23.9*  --   HGB 14.0 15.0 14.2 12.4* 11.6*  HCT 40.6 44.0 40.5 34.2* 34.0*  MCV 95.3  --  92.9 91.0  --   PLT 203  --  194 159  --     Basic Metabolic Panel: Recent Labs  Lab 01/14/20 1519 01/14/20 2028 01/14/20 2225 01/15/20 0330 01/15/20 0546 01/15/20 0629 01/15/20 1007  NA 124*   < > 126* 129* 129* 131* 128*  K 3.1*   < > 3.2* 2.5* 2.6* 2.6* 2.9*  CL 93*  --  94* 101 99  --  101  CO2 <7*  --  <7* 10* 11*  --  11*  GLUCOSE 734*  --  671* 249* 229*  --  247*  BUN 39*  --  54* 57* 59*  --  62*  CREATININE 2.04*  --  2.76* 2.30* 2.34*  --  2.30*  CALCIUM 9.0  --  9.2 8.5* 8.4*  --  8.3*  MG  --   --   --  2.2  --    --   --   PHOS  --   --   --  1.3*  --   --   --    < > = values in this interval not displayed.   GFR: Estimated Creatinine Clearance: 52.6 mL/min (A) (by C-G formula based on SCr of 2.3 mg/dL (H)). Recent Labs  Lab 01/14/20 1519 01/14/20 2225 01/15/20 0330  PROCALCITON  --  47.90  --   WBC 26.1* 27.5* 23.9*  LATICACIDVEN  --  4.2* 2.2*    Liver Function Tests: Recent Labs  Lab 01/14/20 1930  AST 29  ALT 28  ALKPHOS 185*  BILITOT 3.3*  PROT 8.9*  ALBUMIN 2.5*  Recent Labs  Lab 01/14/20 1930  LIPASE 40   No results for input(s): AMMONIA in the last 168 hours.  ABG    Component Value Date/Time   PHART 7.308 (L) 01/15/2020 0629   PCO2ART 24.2 (L) 01/15/2020 0629   PO2ART 89.0 01/15/2020 0629   HCO3 12.2 (L) 01/15/2020 0629   TCO2 13 (L) 01/15/2020 0629   ACIDBASEDEF 13.0 (H) 01/15/2020 0629   O2SAT 96.0 01/15/2020 0629     Coagulation Profile: No results for input(s): INR, PROTIME in the last 168 hours.  Cardiac Enzymes: No results for input(s): CKTOTAL, CKMB, CKMBINDEX, TROPONINI in the last 168 hours.  HbA1C: Hgb A1c MFr Bld  Date/Time Value Ref Range Status  01/26/2016 02:48 PM 5.9 (H) 4.8 - 5.6 % Final    Comment:    (NOTE)         Pre-diabetes: 5.7 - 6.4         Diabetes: >6.4         Glycemic control for adults with diabetes: <7.0     CBG: Recent Labs  Lab 01/15/20 0738 01/15/20 0842 01/15/20 0936 01/15/20 1044 01/15/20 1204  GLUCAP 227* 242* 247* 220* 264*    Review of Systems:   Unable to obtain  Past Medical History  He,  has a past medical history of Abscess, Arthritis, Diabetes mellitus without complication (HCC), Hypertension, and Obesity.   Surgical History    Past Surgical History:  Procedure Laterality Date  . CYST EXCISION N/A 01/28/2016   Procedure: EXCISION SEBACEOUS CYST FOREHEAD X 2;  Surgeon: [name], MD;  Location: [facility] OR;  Service: General;  Laterality: N/A;  . CYST REMOVAL TRUNK Left 01/28/2016    Procedure: EXCISION SEBACEOUS CYST LEFT SHOULDER AND MID-BACK;  Surgeon: [name], MD;  Location: [facility] OR;  Service: General;  Laterality: Left;  . HYDRADENITIS EXCISION Right 01/28/2016   Procedure: EXCISION HIDRADENITIS RIGHT AXILLA ;  Surgeon: [name], MD;  Location: [facility] OR;  Service: General;  Laterality: Right;  . HYDRADENITIS EXCISION N/A 01/28/2016   Procedure: EXCISION HIDRADENITIS POSTERIOR NECK ;  Surgeon: [name], MD;  Location: [facility] OR;  Service: General;  Laterality: N/A;     Social History   reports that he has been smoking cigarettes. He has been smoking about 0.50 packs per day. He does not have any smokeless tobacco history on file. He reports current alcohol use. He reports current drug use. Frequency: 2.00 times per week. Drug: Marijuana.   Family History   His family history includes Cancer in his father.   Allergies Allergies  Allergen Reactions  . Other Other (See Comments)    Itchy throat-strawberries,watermelon     Home Medications  Prior to Admission medications   Medication Sig Start Date End Date Taking? Authorizing Provider  allopurinol (ZYLOPRIM) 300 MG tablet Take 300 mg by mouth daily. 11/16/15   [provider]  clindamycin (CLEOCIN) 300 MG capsule Take 300 mg by mouth 2 (two) times daily. 12/19/15   [provider]  gabapentin (NEURONTIN) 400 MG capsule Take 400 mg by mouth 4 (four) times daily. 12/19/15   [provider]  HYDROmorphone (DILAUDID) 2 MG tablet Take 1 tablet (2 mg total) by mouth every 4 (four) hours as needed for moderate pain or severe pain. 01/28/16   [name], MD  lidocaine (LIDODERM) 5 % Place 1 patch onto the skin daily. Remove & Discard patch within 12 hours or as directed by MD 08/09/16   Charm Rings, MD  LORazepam (ATIVAN) 1 MG tablet Take 1 mg by mouth at bedtime. 12/19/15   [provider]  metFORMIN (GLUCOPHAGE) 1000 MG tablet Take 1,000 mg by mouth daily. 11/16/15   [provider]  metoprolol succinate (TOPROL-XL) 25 MG 24 hr tablet Take 25 mg by mouth daily. 12/19/15   [provider]  naproxen (NAPROSYN) 500 MG tablet Take 1 tablet (500 mg total) by mouth 2 (two) times daily with a meal. 05/27/15   Kindl, Nelda Severe, MD  predniSONE (DELTASONE) 50 MG tablet Take 1 pill daily for 5 days. 08/09/16   Melony Overly, MD  rifampin (RIFADIN) 300 MG capsule Take 300 mg by mouth 2 (two) times daily. 12/19/15   [provider]  triamcinolone cream (KENALOG) 0.1 % Apply 1 application topically 2 (two) times daily. 12/19/15   [provider]  triamterene-hydrochlorothiazide (MAXZIDE-25) 37.5-25 MG tablet Take 1 tablet by mouth daily. 11/16/15   [provider]    Vernice Jefferson, MS4   Dr. Jose Persia Internal Medicine PGY-1  Pager: [phone] 01/15/2020, 3:15 PM  PCCM attending:  This is a 44 year old gentleman history of hidradenitis also type 2 diabetes.  Patient admitted for DKA found to have severe acidosis and hyperglycemia.  BP 136/81 (BP Location: Left Arm)   Pulse (!) 107   Temp 97.6 F (36.4 C) (Axillary)   Resp (!) 35   Ht 5\' 9"  (1.753 m)   Wt 125.6 kg   SpO2 98%   BMI 40.89 kg/m   General: Obese African-American male tachypneic Heart regular rate and rhythm S1-S2 Lungs: Bilateral ventilated breath sounds no crackles no wheeze Abdomen: Obese, soft nontender nondistended  Labs: Reviewed repeat gap this afternoon 16, improving Glucose improved Chest x-ray: No significant infiltrate.  Assessment: Diabetic ketoacidosis Anion gap metabolic acidosis Immunosuppressed Elevated white blood cell count likely related to DKA Broad-spectrum antibiotics and cultures pending AKI  Plan: Continue DKA protocol Remains on Endo tool Continue insulin drip Potential transition from insulin infusion starting tomorrow. Patient still remains somewhat confused continue to observe closely in the ICU AKI likely related to volume  depletion, continue IV fluids per Endo tool protocol Continue follow BMP daily Replacing Electrolytes  This patient is critically ill with multiple organ system failure; which, requires frequent high complexity decision making, assessment, support, evaluation, and titration of therapies. This was completed through the application of advanced monitoring technologies and extensive interpretation of multiple databases. During this encounter critical care time was devoted to patient care services described in this note for 32 minutes.   Garner Nash, DO [facility] 01/15/2020 5:01 PM

## 2020-01-15 NOTE — Care Management (Addendum)
Unit secretary to contact chaplain services per consult to obtain advanced directive/POA.   Per notes;pts wife has been available for communications.  TOC will continue to follow

## 2020-01-15 NOTE — Progress Notes (Signed)
eLink Physician-Brief Progress Note Patient Name: Glenn Murphy DOB: 04-25-1976 MRN: [id number]   Date of Service  01/15/2020  HPI/Events of Note  PH = 7.308.  eICU Interventions  Will D/C NaHCO3 IV infusion.      Intervention Category Major Interventions: Acid-Base disturbance - evaluation and management  Delcia Spitzley [place] 01/15/2020, 6:47 AM

## 2020-01-16 LAB — GLUCOSE, CAPILLARY
Glucose-Capillary: 195 mg/dL — ABNORMAL HIGH (ref 70–99)
Glucose-Capillary: 200 mg/dL — ABNORMAL HIGH (ref 70–99)
Glucose-Capillary: 202 mg/dL — ABNORMAL HIGH (ref 70–99)
Glucose-Capillary: 213 mg/dL — ABNORMAL HIGH (ref 70–99)
Glucose-Capillary: 219 mg/dL — ABNORMAL HIGH (ref 70–99)
Glucose-Capillary: 220 mg/dL — ABNORMAL HIGH (ref 70–99)
Glucose-Capillary: 220 mg/dL — ABNORMAL HIGH (ref 70–99)
Glucose-Capillary: 223 mg/dL — ABNORMAL HIGH (ref 70–99)
Glucose-Capillary: 223 mg/dL — ABNORMAL HIGH (ref 70–99)
Glucose-Capillary: 227 mg/dL — ABNORMAL HIGH (ref 70–99)
Glucose-Capillary: 228 mg/dL — ABNORMAL HIGH (ref 70–99)
Glucose-Capillary: 231 mg/dL — ABNORMAL HIGH (ref 70–99)
Glucose-Capillary: 231 mg/dL — ABNORMAL HIGH (ref 70–99)
Glucose-Capillary: 231 mg/dL — ABNORMAL HIGH (ref 70–99)
Glucose-Capillary: 238 mg/dL — ABNORMAL HIGH (ref 70–99)
Glucose-Capillary: 242 mg/dL — ABNORMAL HIGH (ref 70–99)
Glucose-Capillary: 242 mg/dL — ABNORMAL HIGH (ref 70–99)
Glucose-Capillary: 243 mg/dL — ABNORMAL HIGH (ref 70–99)
Glucose-Capillary: 246 mg/dL — ABNORMAL HIGH (ref 70–99)
Glucose-Capillary: 246 mg/dL — ABNORMAL HIGH (ref 70–99)
Glucose-Capillary: 250 mg/dL — ABNORMAL HIGH (ref 70–99)
Glucose-Capillary: 254 mg/dL — ABNORMAL HIGH (ref 70–99)
Glucose-Capillary: 259 mg/dL — ABNORMAL HIGH (ref 70–99)
Glucose-Capillary: 260 mg/dL — ABNORMAL HIGH (ref 70–99)
Glucose-Capillary: 266 mg/dL — ABNORMAL HIGH (ref 70–99)

## 2020-01-16 LAB — BASIC METABOLIC PANEL
Anion gap: 16 — ABNORMAL HIGH (ref 5–15)
Anion gap: 17 — ABNORMAL HIGH (ref 5–15)
Anion gap: 18 — ABNORMAL HIGH (ref 5–15)
BUN: 65 mg/dL — ABNORMAL HIGH (ref 6–20)
BUN: 65 mg/dL — ABNORMAL HIGH (ref 6–20)
BUN: 64 mg/dL — ABNORMAL HIGH (ref 6–20)
CO2: 10 mmol/L — ABNORMAL LOW (ref 22–32)
CO2: 9 mmol/L — ABNORMAL LOW (ref 22–32)
CO2: 9 mmol/L — ABNORMAL LOW (ref 22–32)
Calcium: 8 mg/dL — ABNORMAL LOW (ref 8.9–10.3)
Calcium: 7.6 mg/dL — ABNORMAL LOW (ref 8.9–10.3)
Calcium: 8.1 mg/dL — ABNORMAL LOW (ref 8.9–10.3)
Chloride: 104 mmol/L (ref 98–111)
Chloride: 102 mmol/L (ref 98–111)
Chloride: 104 mmol/L (ref 98–111)
Creatinine, Ser: 2.31 mg/dL — ABNORMAL HIGH (ref 0.61–1.24)
Creatinine, Ser: 2.31 mg/dL — ABNORMAL HIGH (ref 0.61–1.24)
Creatinine, Ser: 2.41 mg/dL — ABNORMAL HIGH (ref 0.61–1.24)
GFR calc Af Amer: 38 mL/min — ABNORMAL LOW (ref >60)
GFR calc Af Amer: 38 mL/min — ABNORMAL LOW (ref >60)
GFR calc Af Amer: 36 mL/min — ABNORMAL LOW (ref >60)
GFR calc non Af Amer: 33 mL/min — ABNORMAL LOW (ref >60)
GFR calc non Af Amer: 33 mL/min — ABNORMAL LOW (ref >60)
GFR calc non Af Amer: 31 mL/min — ABNORMAL LOW (ref >60)
Glucose, Bld: 218 mg/dL — ABNORMAL HIGH (ref 70–99)
Glucose, Bld: 208 mg/dL — ABNORMAL HIGH (ref 70–99)
Glucose, Bld: 227 mg/dL — ABNORMAL HIGH (ref 70–99)
Potassium: 2.6 mmol/L — CL (ref 3.5–5.1)
Potassium: 3.2 mmol/L — ABNORMAL LOW (ref 3.5–5.1)
Potassium: 2.9 mmol/L — ABNORMAL LOW (ref 3.5–5.1)
Sodium: 130 mmol/L — ABNORMAL LOW (ref 135–145)
Sodium: 128 mmol/L — ABNORMAL LOW (ref 135–145)
Sodium: 131 mmol/L — ABNORMAL LOW (ref 135–145)

## 2020-01-16 LAB — BETA-HYDROXYBUTYRIC ACID
Beta-Hydroxybutyric Acid: 0.28 mmol/L — ABNORMAL HIGH (ref 0.05–0.27)
Beta-Hydroxybutyric Acid: 0.19 mmol/L (ref 0.05–0.27)
Beta-Hydroxybutyric Acid: 0.11 mmol/L (ref 0.05–0.27)

## 2020-01-16 LAB — CBC
HCT: 29 % — ABNORMAL LOW (ref 39.0–52.0)
Hemoglobin: 10.8 g/dL — ABNORMAL LOW (ref 13.0–17.0)
MCH: 32.2 pg (ref 26.0–34.0)
MCHC: 37.2 g/dL — ABNORMAL HIGH (ref 30.0–36.0)
MCV: 86.6 fL (ref 80.0–100.0)
Platelets: 96 10*3/uL — ABNORMAL LOW (ref 150–400)
RBC: 3.35 MIL/uL — ABNORMAL LOW (ref 4.22–5.81)
RDW: 13.4 % (ref 11.5–15.5)
WBC: 13.5 10*3/uL — ABNORMAL HIGH (ref 4.0–10.5)
nRBC: 0.2 % (ref 0.0–0.2)

## 2020-01-16 LAB — CBC WITH DIFFERENTIAL/PLATELET
Abs Immature Granulocytes: 0.34 10*3/uL — ABNORMAL HIGH (ref 0.00–0.07)
Basophils Absolute: 0 10*3/uL (ref 0.0–0.1)
Basophils Relative: 0 %
Eosinophils Absolute: 0 10*3/uL (ref 0.0–0.5)
Eosinophils Relative: 0 %
HCT: 32.4 % — ABNORMAL LOW (ref 39.0–52.0)
Hemoglobin: 11.8 g/dL — ABNORMAL LOW (ref 13.0–17.0)
Immature Granulocytes: 3 %
Lymphocytes Relative: 11 %
Lymphs Abs: 1.5 10*3/uL (ref 0.7–4.0)
MCH: 32.4 pg (ref 26.0–34.0)
MCHC: 36.4 g/dL — ABNORMAL HIGH (ref 30.0–36.0)
MCV: 89 fL (ref 80.0–100.0)
Monocytes Absolute: 0.6 10*3/uL (ref 0.1–1.0)
Monocytes Relative: 5 %
Neutro Abs: 10.3 10*3/uL — ABNORMAL HIGH (ref 1.7–7.7)
Neutrophils Relative %: 81 %
Platelets: 91 10*3/uL — ABNORMAL LOW (ref 150–400)
RBC: 3.64 MIL/uL — ABNORMAL LOW (ref 4.22–5.81)
RDW: 13.6 % (ref 11.5–15.5)
WBC: 12.7 10*3/uL — ABNORMAL HIGH (ref 4.0–10.5)
nRBC: 0.4 % — ABNORMAL HIGH (ref 0.0–0.2)

## 2020-01-16 LAB — CK: Total CK: 66 U/L (ref 49–397)

## 2020-01-16 LAB — URINE CULTURE: Culture: NO GROWTH

## 2020-01-16 LAB — PHOSPHORUS: Phosphorus: 1.1 mg/dL — ABNORMAL LOW (ref 2.5–4.6)

## 2020-01-16 MED ORDER — POTASSIUM CHLORIDE 10 MEQ/100ML IV SOLN
10.0000 meq | INTRAVENOUS | Status: AC
  Administered 2020-01-16 (×6): 10 meq via INTRAVENOUS
  Filled 2020-01-16 (×6): qty 100

## 2020-01-16 MED ORDER — LIVING WELL WITH DIABETES BOOK
Freq: Once | Status: AC
  Filled 2020-01-16: qty 1

## 2020-01-16 MED ORDER — POTASSIUM CHLORIDE 10 MEQ/50ML IV SOLN: 10.0000 meq | INTRAVENOUS | Status: DC

## 2020-01-16 MED ORDER — POTASSIUM PHOSPHATES 15 MMOLE/5ML IV SOLN
30.0000 mmol | Freq: Once | INTRAVENOUS | Status: AC
  Administered 2020-01-16: 30 mmol via INTRAVENOUS
  Filled 2020-01-16: qty 10

## 2020-01-16 MED ORDER — LORAZEPAM 1 MG PO TABS
1.0000 mg | ORAL_TABLET | Freq: Once | ORAL | Status: AC
  Administered 2020-01-16: 1 mg via ORAL
  Filled 2020-01-16: qty 1

## 2020-01-16 NOTE — Progress Notes (Signed)
eLink Physician-Brief Progress Note Patient Name: Glenn Murphy DOB: 09/14/1976 MRN: [id number]   Date of Service  01/16/2020  HPI/Events of Note  Hypokalemia - K+ = 2.6 and Creatinine = 2.31.   eICU Interventions  Will replace K+.      Intervention Category Major Interventions: Electrolyte abnormality - evaluation and management  Ariane Ditullio Eugene 01/16/2020, 3:28 AM

## 2020-01-16 NOTE — Progress Notes (Addendum)
Admit with: DKA  History: Pre-Diabetes (per wife), Hidradenitis suppurativa  Home DM Meds: Metformin 1000 mg Daily (started February)  Current Orders: IV Insulin Drip    Note 7am BMET today shows Anion Gap still elevated to 17 and CO2 level still low at 9--Needs to remain on the IV Insulin Drip until Anion Gap closer to 12 and CO2 level closer to 20.    MD- Please leave patient on the IV Insulin Drip until Anion Gap closer to 12 and CO2 level closer to 20  Given Current A1c of 11.2% and admission with DKA, likely will need Insulin at time of discharge home.     Addendum 11:45am--Met with pt and pt's wife [place] at bedside this AM.  Pt sleeping and waking intermittently but appeared very drowsy still and not appropriate for in-depth conversation.  Spoke mainly with wife [place].    Wife told me pt was diagnosed w/ pre-Diabetes by his PCP Dr. Vassie Moment.  Was taking Metformin at some point but, per wife, pt NOT currently taking the Metformin.  Pt never officially diagnosed with diabetes (only pre-diabetes) per wife.  Spoke with pt's wife about new diagnosis.  Discussed A1C results with her and explained what an A1C is, basic pathophysiology of DM Type 2, basic home care, importance of checking CBGs and maintaining good CBG control to prevent long-term and short-term complications.  Pt's wife was concerned that pt hadn't had an A1c checked by his PCP, however, I encouraged pt's wife to follow up with the PCP office as sometimes A1c levels are drawn and if they are WNL they may not be communicated to the patient.  Discussed w/ wife that since Current A1c is 11.2%, that this is definitely a new diagnosis and that pt may need insulin at time of d/c home.  Discussed with pt's wife that we will follow daily and assist with teaching and glucose management.  Would like to wait until pt more alert and less drowsy before proceeding.  Also discussed with patient diagnosis of DKA  (pathophysiology), treatment of DKA, lab results, and transition plan to SQ insulin regimen.  Discussed w/ wife that we will keep pt on the IV Insulin drip until his labs have improved.  RNs to provide ongoing basic DM education at bedside with this patient once pt more appropriate.  Will wait to order insulin starter kit, and DM videos until pt more appropriate.  Went ahead and ordered Living well with diabetes book so wife could start reading it.  Have also placed RD consult for DM diet education for this patient.     --Will follow patient during hospitalization--  Wyn Quaker RN, MSN, CDE Diabetes Coordinator Inpatient Glycemic Control Team Team Pager: [phone] (8a-5p)

## 2020-01-16 NOTE — Progress Notes (Signed)
eLink Physician-Brief Progress Note Patient Name: Glenn Murphy DOB: November 18, 1975 MRN: [id number]   Date of Service  01/16/2020  HPI/Events of Note  Patient requests home Ativan for sleep.   eICU Interventions  Will order: 1. Ativan 1 mg PO X 1 now.      Intervention Category Major Interventions: Other:  Lenell Antu 01/16/2020, 1:44 AM

## 2020-01-16 NOTE — Progress Notes (Signed)
Wife at bedside and updated on patient status.

## 2020-01-16 NOTE — Progress Notes (Addendum)
NAME:  Glenn Murphy, MRN:  [id number], DOB:  10/28/1975, LOS: 2 ADMISSION DATE:  01/14/2020, CONSULTATION DATE:  01/14/2020 REFERRING MD:  Zacarias Pontes, CHIEF COMPLAINT:  "Hurting all over"  Brief History   Patient is a 44 year old male with lifelong history of hidradenitis brought to the emergency room with hyperglycemia, elevated BHB and anion gap metabolic acidosis.  History of present illness   Patient is a 44 year old male with a lifelong history of hidradenitis who in February was placed on Accutane and prednisone.  Prior to that he been on continuous antibiotics most recently doxycycline for about a year.  On an evaluation at [facility] on 3/26 he was having blurry vision felt secondary to Accutane with little clinical improvement along with loss of appetite.  The Accutane was stopped on that visit.  Notes on that visit indicate he was started on Humira but it is not clear as to whether he has been taking it or not.  He was also started on clindamycin gel.  He also was started on Metformin in February.  On evaluation in the emergency room the patient complained of thirst and hurting all over.  Lab results showed a sodium of 124 with a bicarb less than 7, glucose of 734, BHB >8 and creatinine of 2.04.  He also had a white count of 26.1 hemoglobin 14.  ABG showed a pH of 7.024 with PCO2 of 23 PO2 of 108 with a bicarbonate of 6 and anion gap of 23.   Past Medical History  Hidradenitis suppurativa  DM HTN  Significant Hospital Events   4/6: admitted to ICU   Consults:  N/A  Procedures:  N/A  Significant Diagnostic Tests:  CXR 4/6: Lungs clear without pleural effusion.   Micro Data:  Blood Cx 4/6: no growth in <12 hrs Urine Cx 4/7: Negative  Antimicrobials:  Vancomycin 2500 mg 4/6>>4/6 Piperacillin-tazobactam 3.375 g 4/6>> 01/15/2020 Rocephin 01/15/2020>> Interim history/subjective:  Somewhat stuporous but follows commands moves all extremities. I suspect  he may have been undiagnosed obstructive sleep apnea. Objective   Blood pressure 135/80, pulse (!) 110, temperature 98.6 F (37 C), temperature source Axillary, resp. rate (!) 30, height 5\' 9"  (1.753 m), weight 128.7 kg, SpO2 99 %.        Intake/Output Summary (Last 24 hours) at 01/16/2020 0906 Last data filed at 01/16/2020 0800 Gross per 24 hour  Intake 6314.01 ml  Output 1610 ml  Net 4704.01 ml   Filed Weights   01/14/20 1508 01/15/20 0156 01/16/20 0241  Weight: (!) 140.6 kg 125.6 kg 128.7 kg    Examination: .General: Morbidly obese male who is somewhat stuporous, noted to have had received Ativan p.o. during the night per evening HEENT: Short neck, no JVD lymphadenopathy Neuro: Stuporous suspect secondary to medications since he was talking enough to ask for sleep medicine CV: Regular sinus tach PULM: Diminished in the bases  GI: soft, bsx4 active  Extremities: warm/dry, 1+ edema  Skin: no rashes or lesions   Resolved Hospital Problem list   None   Assessment & Plan:  Suspected undiagnosed obstructive sleep apnea Would avoid anxiolytics in this gentleman at night   DKA:  Pt presented with glucose of 734, gap acidosis with elevated beta-hydroxybutyric acid of >7.00. Initial ABG showed metabolic acidosis with pH of 7.02 most consistent with DKA, rather than HHS. Current likely trigger for DKA is infection source assumed to be skin versus urinary. Other possible trigger may be recent initiation of  Humira for HS.   Bmet q12h Replete electrolytes as needed Continue insulin drip per protocol Should transition off insulin drip in the near future  Leukocytosis:  Pt afebrile but has a markedly elevated WBC of 23.9. Etiology likely from DKA but concern for underlying infection. No growth in bcx and ucx pending. Unlikely to be MRSA or pseudomonas, so will de-escalate antibiotics.    Continue current antimicrobial therapy Monitor culture data   AKI:  Lab Results  Component  Value Date   CREATININE 2.31 (H) 01/16/2020   CREATININE 2.31 (H) 01/16/2020   CREATININE 2.32 (H) 01/15/2020     Monitor creatinine Avoid nephrotoxins  Hidradenitis suppurative (Hurley Stage 3)  Monitor for signs and symptoms infection  Urinary retention Bladder scan revealed over 800 mL of retention. Currently has foley. Etiology may be acute illness versus obstruction  Continue Foley continue  Renal ultrasound was normal Best practice:  Diet: NPO Pain/Anxiety/Delirium protocol (if indicated): n/a VAP protocol (if indicated): in place DVT prophylaxis: heparin  GI prophylaxis: pantoprazole  Glucose control: insulin infusion  Mobility: BR Code Status: Full Family Communication: [phone] Disposition: ICU  Labs   CBC: Recent Labs  Lab 01/14/20 1519 01/14/20 2028 01/14/20 2225 01/15/20 0330 01/15/20 0629  WBC 26.1*  --  27.5* 23.9*  --   HGB 14.0 15.0 14.2 12.4* 11.6*  HCT 40.6 44.0 40.5 34.2* 34.0*  MCV 95.3  --  92.9 91.0  --   PLT 203  --  194 159  --     Basic Metabolic Panel: Recent Labs  Lab 01/15/20 0330 01/15/20 0546 01/15/20 1007 01/15/20 1255 01/15/20 1458 01/15/20 1847 01/15/20 2226 01/16/20 0246 01/16/20 0701  NA 129*   < >   < >  --  128* 129* 128* 130* 128*  K 2.5*   < >   < >  --  2.8* 2.8* 2.8* 2.6* 3.2*  CL 101   < >   < >  --  101 101 103 104 102  CO2 10*   < >   < >  --  10* 11* 11* 10* 9*  GLUCOSE 249*   < >   < >  --  267* 278* 232* 218* 208*  BUN 57*   < >   < >  --  65* 66* 65* 65* 65*  CREATININE 2.30*   < >   < >  --  2.32* 2.30* 2.32* 2.31* 2.31*  CALCIUM 8.5*   < >   < >  --  8.1* 8.0* 8.0* 8.0* 7.6*  MG 2.2  --   --   --   --   --   --   --   --   PHOS 1.3*  --   --  1.4*  --   --   --   --   --    < > = values in this interval not displayed.   GFR: Estimated Creatinine Clearance: 53.1 mL/min (A) (by C-G formula based on SCr of 2.31 mg/dL (H)). Recent Labs  Lab 01/14/20 1519 01/14/20 2225 01/15/20 0330  PROCALCITON   --  47.90  --   WBC 26.1* 27.5* 23.9*  LATICACIDVEN  --  4.2* 2.2*    Liver Function Tests: Recent Labs  Lab 01/14/20 1930  AST 29  ALT 28  ALKPHOS 185*  BILITOT 3.3*  PROT 8.9*  ALBUMIN 2.5*   Recent Labs  Lab 01/14/20 1930  LIPASE 40   No results for input(s):  AMMONIA in the last 168 hours.  ABG    Component Value Date/Time   PHART 7.308 (L) 01/15/2020 0629   PCO2ART 24.2 (L) 01/15/2020 0629   PO2ART 89.0 01/15/2020 0629   HCO3 12.2 (L) 01/15/2020 0629   TCO2 13 (L) 01/15/2020 0629   ACIDBASEDEF 13.0 (H) 01/15/2020 0629   O2SAT 96.0 01/15/2020 0629     Coagulation Profile: No results for input(s): INR, PROTIME in the last 168 hours.  Cardiac Enzymes: No results for input(s): CKTOTAL, CKMB, CKMBINDEX, TROPONINI in the last 168 hours.  HbA1C: Hgb A1c MFr Bld  Date/Time Value Ref Range Status  01/14/2020 10:25 PM 11.2 (H) 4.8 - 5.6 % Final    Comment:    (NOTE)         Prediabetes: 5.7 - 6.4         Diabetes: >6.4         Glycemic control for adults with diabetes: <7.0   01/26/2016 02:48 PM 5.9 (H) 4.8 - 5.6 % Final    Comment:    (NOTE)         Pre-diabetes: 5.7 - 6.4         Diabetes: >6.4         Glycemic control for adults with diabetes: <7.0     CBG: Recent Labs  Lab 01/16/20 0438 01/16/20 0532 01/16/20 0626 01/16/20 0724 01/16/20 0829  GLUCAP 219* 231* 220* 202* 223*     App cct 30 min  Brett Canales Minor ACNP Acute Care Nurse Practitioner [facility] Pulmonary/Critical Care Please consult Amion 01/16/2020, 9:07 AM   Pulmonary critical care attending:  This is a 44 year old obese gentleman, past medical history of hidradenitis suppurativa, on immune suppressants for this. Patient admitted with severe hyperglycemia, severe acidosis and DKA.  BP (!) 154/80 (BP Location: Left Arm)   Pulse (!) 119   Temp 98 F (36.7 C) (Oral)   Resp (!) 35   Ht 5\' 9"  (1.753 m)   Wt 128.7 kg   SpO2 97%   BMI 41.90 kg/m   General: Obese  African-American male tachypneic lying in bed, slightly confused Heart: Regular rate rhythm S1-S2 Lungs: Bilateral breath sounds no crackles no wheeze Abdomen: Soft nontender GU: Dark urine  Labs: Reviewed, gap slightly increased Glucose improved Chest x-ray: No significant infiltrate  Assessment: Diabetic ketoacidosis Anion gap metabolic acidosis Immune suppressed. Likely acute on chronic renal failure Hematuria  Plan: Continue DKA protocol Remains on Endo tool Continue insulin drip If kidney function continues to worsen and does not improve we will need to consider nephrology consultation tomorrow. Check ck   This patient is critically ill with multiple organ system failure; which, requires frequent high complexity decision making, assessment, support, evaluation, and titration of therapies. This was completed through the application of advanced monitoring technologies and extensive interpretation of multiple databases. During this encounter critical care time was devoted to patient care services described in this note for 32 minutes.   , DO [place] [facility]monary Critical Care 01/16/2020 5:54 PM

## 2020-01-16 NOTE — Progress Notes (Addendum)
Inpatient Diabetes Program Recommendations  AACE/ADA: New Consensus Statement on Inpatient Glycemic Control (2015)  Target Ranges:  Prepandial:   less than 140 mg/dL      Peak postprandial:   less than 180 mg/dL (1-2 hours)      Critically ill patients:  140 - 180 mg/dL   Results for SABRI, TEAL (MRN [id number]) as of 01/16/2020 09:10  Ref. Range 01/14/2020 22:25  Hemoglobin A1C Latest Ref Range: 4.8 - 5.6 % 11.2 (H)  (275 mg/dl)   Results for DAILAN, PFALZGRAF (MRN [id number]) as of 01/16/2020 09:10  Ref. Range 01/14/2020 19:30 01/15/2020 03:30 01/15/2020 10:07 01/15/2020 18:47 01/16/2020 00:50 01/16/2020 07:05  Beta-Hydroxybutyric Acid Latest Ref Range: 0.05 - 0.27 mmol/L >8.00 (H) 2.73 (H) 1.73 (H) 0.53 (H) 0.28 (H) 0.19    Admit with: DKA  History: Pre-Diabetes (per wife), Hidradenitis suppurativa   Home DM Meds: Metformin 1000 mg Daily (started February)  Current Orders: IV Insulin Drip    Note 7am BMET today shows Anion Gap still elevated to 17 and CO2 level still low at 9--Needs to remain on the IV Insulin Drip until Anion Gap closer to 12 and CO2 level closer to 20.    MD- Please leave patient on the IV Insulin Drip until Anion Gap closer to 12 and CO2 level closer to 20  Given Current A1c of 11.2% and admission with DKA, likely will need Insulin at time of discharge home.  Will speak with pt once appropriate.     --Will follow patient during hospitalization--  Ambrose Finland RN, MSN, CDE Diabetes Coordinator Inpatient Glycemic Control Team Team Pager: [phone] (8a-5p)

## 2020-01-17 LAB — CBC WITH DIFFERENTIAL/PLATELET
Abs Immature Granulocytes: 0.7 10*3/uL — ABNORMAL HIGH (ref 0.00–0.07)
Basophils Absolute: 0.1 10*3/uL (ref 0.0–0.1)
Basophils Relative: 1 %
Eosinophils Absolute: 0 10*3/uL (ref 0.0–0.5)
Eosinophils Relative: 0 %
HCT: 29.6 % — ABNORMAL LOW (ref 39.0–52.0)
Hemoglobin: 11 g/dL — ABNORMAL LOW (ref 13.0–17.0)
Immature Granulocytes: 5 %
Lymphocytes Relative: 9 %
Lymphs Abs: 1.3 10*3/uL (ref 0.7–4.0)
MCH: 32.4 pg (ref 26.0–34.0)
MCHC: 37.2 g/dL — ABNORMAL HIGH (ref 30.0–36.0)
MCV: 87.1 fL (ref 80.0–100.0)
Monocytes Absolute: 0.4 10*3/uL (ref 0.1–1.0)
Monocytes Relative: 3 %
Neutro Abs: 11.3 10*3/uL — ABNORMAL HIGH (ref 1.7–7.7)
Neutrophils Relative %: 82 %
Platelets: 101 10*3/uL — ABNORMAL LOW (ref 150–400)
RBC: 3.4 MIL/uL — ABNORMAL LOW (ref 4.22–5.81)
RDW: 13.5 % (ref 11.5–15.5)
WBC: 13.8 10*3/uL — ABNORMAL HIGH (ref 4.0–10.5)
nRBC: 0.1 % (ref 0.0–0.2)

## 2020-01-17 LAB — BASIC METABOLIC PANEL
Anion gap: 13 (ref 5–15)
Anion gap: 16 — ABNORMAL HIGH (ref 5–15)
Anion gap: 16 — ABNORMAL HIGH (ref 5–15)
BUN: 57 mg/dL — ABNORMAL HIGH (ref 6–20)
BUN: 60 mg/dL — ABNORMAL HIGH (ref 6–20)
BUN: 61 mg/dL — ABNORMAL HIGH (ref 6–20)
CO2: 10 mmol/L — ABNORMAL LOW (ref 22–32)
CO2: 7 mmol/L — ABNORMAL LOW (ref 22–32)
CO2: 9 mmol/L — ABNORMAL LOW (ref 22–32)
Calcium: 8 mg/dL — ABNORMAL LOW (ref 8.9–10.3)
Calcium: 8.5 mg/dL — ABNORMAL LOW (ref 8.9–10.3)
Calcium: 8.5 mg/dL — ABNORMAL LOW (ref 8.9–10.3)
Chloride: 104 mmol/L (ref 98–111)
Chloride: 109 mmol/L (ref 98–111)
Chloride: 113 mmol/L — ABNORMAL HIGH (ref 98–111)
Creatinine, Ser: 2.31 mg/dL — ABNORMAL HIGH (ref 0.61–1.24)
Creatinine, Ser: 2.4 mg/dL — ABNORMAL HIGH (ref 0.61–1.24)
Creatinine, Ser: 2.44 mg/dL — ABNORMAL HIGH (ref 0.61–1.24)
GFR calc Af Amer: 35 mL/min — ABNORMAL LOW (ref >60)
GFR calc Af Amer: 36 mL/min — ABNORMAL LOW (ref >60)
GFR calc Af Amer: 38 mL/min — ABNORMAL LOW (ref >60)
GFR calc non Af Amer: 31 mL/min — ABNORMAL LOW (ref >60)
GFR calc non Af Amer: 31 mL/min — ABNORMAL LOW (ref >60)
GFR calc non Af Amer: 33 mL/min — ABNORMAL LOW (ref >60)
Glucose, Bld: 221 mg/dL — ABNORMAL HIGH (ref 70–99)
Glucose, Bld: 221 mg/dL — ABNORMAL HIGH (ref 70–99)
Glucose, Bld: 247 mg/dL — ABNORMAL HIGH (ref 70–99)
Potassium: 2.9 mmol/L — ABNORMAL LOW (ref 3.5–5.1)
Potassium: 3.2 mmol/L — ABNORMAL LOW (ref 3.5–5.1)
Potassium: 3.4 mmol/L — ABNORMAL LOW (ref 3.5–5.1)
Sodium: 127 mmol/L — ABNORMAL LOW (ref 135–145)
Sodium: 134 mmol/L — ABNORMAL LOW (ref 135–145)
Sodium: 136 mmol/L (ref 135–145)

## 2020-01-17 LAB — HEPATIC FUNCTION PANEL
ALT: 23 U/L (ref 0–44)
AST: 24 U/L (ref 15–41)
Albumin: 1.6 g/dL — ABNORMAL LOW (ref 3.5–5.0)
Alkaline Phosphatase: 127 U/L — ABNORMAL HIGH (ref 38–126)
Bilirubin, Direct: 2.2 mg/dL — ABNORMAL HIGH (ref 0.0–0.2)
Indirect Bilirubin: 1.3 mg/dL — ABNORMAL HIGH (ref 0.3–0.9)
Total Bilirubin: 3.5 mg/dL — ABNORMAL HIGH (ref 0.3–1.2)
Total Protein: 7.6 g/dL (ref 6.5–8.1)

## 2020-01-17 LAB — GLUCOSE, CAPILLARY
Glucose-Capillary: 237 mg/dL — ABNORMAL HIGH (ref 70–99)
Glucose-Capillary: 232 mg/dL — ABNORMAL HIGH (ref 70–99)
Glucose-Capillary: 215 mg/dL — ABNORMAL HIGH (ref 70–99)
Glucose-Capillary: 227 mg/dL — ABNORMAL HIGH (ref 70–99)
Glucose-Capillary: 231 mg/dL — ABNORMAL HIGH (ref 70–99)
Glucose-Capillary: 210 mg/dL — ABNORMAL HIGH (ref 70–99)
Glucose-Capillary: 214 mg/dL — ABNORMAL HIGH (ref 70–99)
Glucose-Capillary: 217 mg/dL — ABNORMAL HIGH (ref 70–99)
Glucose-Capillary: 212 mg/dL — ABNORMAL HIGH (ref 70–99)
Glucose-Capillary: 217 mg/dL — ABNORMAL HIGH (ref 70–99)
Glucose-Capillary: 215 mg/dL — ABNORMAL HIGH (ref 70–99)
Glucose-Capillary: 243 mg/dL — ABNORMAL HIGH (ref 70–99)
Glucose-Capillary: 197 mg/dL — ABNORMAL HIGH (ref 70–99)
Glucose-Capillary: 213 mg/dL — ABNORMAL HIGH (ref 70–99)
Glucose-Capillary: 208 mg/dL — ABNORMAL HIGH (ref 70–99)
Glucose-Capillary: 223 mg/dL — ABNORMAL HIGH (ref 70–99)
Glucose-Capillary: 226 mg/dL — ABNORMAL HIGH (ref 70–99)
Glucose-Capillary: 212 mg/dL — ABNORMAL HIGH (ref 70–99)
Glucose-Capillary: 180 mg/dL — ABNORMAL HIGH (ref 70–99)

## 2020-01-17 LAB — PHOSPHORUS: Phosphorus: 1.9 mg/dL — ABNORMAL LOW (ref 2.5–4.6)

## 2020-01-17 LAB — PROTEIN / CREATININE RATIO, URINE
Creatinine, Urine: 42.16 mg/dL
Protein Creatinine Ratio: 2.4 mg/mg{Cre} — ABNORMAL HIGH (ref 0.00–0.15)
Total Protein, Urine: 101 mg/dL

## 2020-01-17 LAB — HEPATITIS B CORE ANTIBODY, TOTAL: Hep B Core Total Ab: NONREACTIVE

## 2020-01-17 LAB — HEPATITIS B SURFACE ANTIGEN: Hepatitis B Surface Ag: NONREACTIVE

## 2020-01-17 LAB — MAGNESIUM: Magnesium: 2 mg/dL (ref 1.7–2.4)

## 2020-01-17 LAB — BETA-HYDROXYBUTYRIC ACID: Beta-Hydroxybutyric Acid: 0.07 mmol/L (ref 0.05–0.27)

## 2020-01-17 MED ORDER — INSULIN STARTER KIT- PEN NEEDLES (ENGLISH)
1.0000 | Freq: Once | Status: DC
  Filled 2020-01-17 (×2): qty 1

## 2020-01-17 MED ORDER — DEXTROSE IN LACTATED RINGERS 5 % IV SOLN: INTRAVENOUS | Status: DC

## 2020-01-17 MED ORDER — STERILE WATER FOR INJECTION IV SOLN
INTRAVENOUS | Status: DC
  Filled 2020-01-17 (×7): qty 9.71

## 2020-01-17 MED ORDER — POTASSIUM PHOSPHATES 15 MMOLE/5ML IV SOLN
30.0000 mmol | Freq: Once | INTRAVENOUS | Status: AC
  Administered 2020-01-17: 15:00:00 30 mmol via INTRAVENOUS
  Filled 2020-01-17: qty 10

## 2020-01-17 MED ORDER — SODIUM BICARBONATE 650 MG PO TABS: 650.0000 mg | ORAL_TABLET | Freq: Two times a day (BID) | ORAL | Status: DC

## 2020-01-17 MED ORDER — POTASSIUM CHLORIDE 10 MEQ/100ML IV SOLN
10.0000 meq | INTRAVENOUS | Status: AC
  Administered 2020-01-17 (×3): 10 meq via INTRAVENOUS
  Filled 2020-01-17 (×4): qty 100

## 2020-01-17 NOTE — Progress Notes (Signed)
Inpatient Diabetes Program Recommendations  AACE/ADA: New Consensus Statement on Inpatient Glycemic Control (2015)  Target Ranges:  Prepandial:   less than 140 mg/dL      Peak postprandial:   less than 180 mg/dL (1-2 hours)      Critically ill patients:  140 - 180 mg/dL   Lab Results  Component Value Date   GLUCAP 215 (H) 01/17/2020   HGBA1C 11.2 (H) 01/14/2020    Review of Glycemic Control Results for YERICK, EGGEBRECHT (MRN [id number]) as of 01/17/2020 13:47  Ref. Range 01/17/2020 08:35 01/17/2020 09:19 01/17/2020 10:30 01/17/2020 11:40 01/17/2020 12:38  Glucose-Capillary Latest Ref Range: 70 - 99 mg/dL 831 (H) 674 (H) 255 (H) 217 (H) 215 (H)  History:Pre-Diabetes (per wife),Hidradenitis suppurativa  Home DM Meds:Metformin 1000 mg Daily (started February)  Current Orders:IV Insulin Drip Inpatient Diabetes Program Recommendations:    Continues on IV insulin.  Attempted to see patient today.  He was sleeping soundly.  Will need to f/u on insulin/ DM teaching once patient feeling better.  DM coordinator had long conversation with patient's wife on 01/16/20.    When patient is ready for transition and DKA cleared, consider Levemir 30 units bid (will need basal insulin 2 hours prior to d/c of insulin drip).   Thanks,  Beryl Meager, RN, BC-ADM Inpatient Diabetes Coordinator Pager [phone] (8a-5p)

## 2020-01-17 NOTE — Progress Notes (Signed)
Nutrition Education Note  RD consulted for nutrition education regarding new onset diabetes. Spoke with patient's wife at bedside regarding diet recommendations for diabetes. Patient sleeping during education.   Lab Results  Component Value Date   HGBA1C 11.2 (H) 01/14/2020    RD provided "Carbohydrate Counting for People with Diabetes" handout from the Academy of Nutrition and Dietetics. Discussed different food groups and their effects on blood sugar, emphasizing carbohydrate-containing foods. Provided list of carbohydrates and recommended serving sizes of common foods.  Discussed importance of controlled and consistent carbohydrate intake throughout the day. Provided examples of ways to balance meals/snacks and encouraged intake of high-fiber, whole grain complex carbohydrates. Teach back method used.  Expect good compliance.  Body mass index is 42.94 kg/m. Pt meets criteria for class 3, extreme/morbid obesity based on current BMI.  Current diet order is NPO. RD to monitor for adequacy of intake when diet is advanced. Labs and medications reviewed. No further nutrition interventions warranted at this time. RD contact information provided. If additional nutrition issues arise, please re-consult RD.   Joaquin Courts, RD, LDN, CNSC Please refer to [facility] for contact information.

## 2020-01-17 NOTE — Progress Notes (Addendum)
NAME:  Glenn Murphy, MRN:  [id number], DOB:  [birth year], LOS: 3 ADMISSION DATE:  01/14/2020, CONSULTATION DATE:  01/14/2020 REFERRING MD:  Redge Gainer, CHIEF COMPLAINT:  "Hurting all over"  Brief History   Patient is a 44 year old male with lifelong history of hidradenitis brought to the emergency room with hyperglycemia, elevated BHB and anion gap metabolic acidosis.  History of present illness   Patient is a 44 year old male with a lifelong history of hidradenitis who in February was placed on Accutane and prednisone.  Prior to that he been on continuous antibiotics most recently doxycycline for about a year.  On an evaluation at [facility] on 3/26 he was having blurry vision felt secondary to Accutane with little clinical improvement along with loss of appetite.  The Accutane was stopped on that visit.  Notes on that visit indicate he was started on Humira but it is not clear as to whether he has been taking it or not.  He was also started on clindamycin gel.  He also was started on Metformin in February.  On evaluation in the emergency room the patient complained of thirst and hurting all over.  Lab results showed a sodium of 124 with a bicarb less than 7, glucose of 734, BHB >8 and creatinine of 2.04.  He also had a white count of 26.1 hemoglobin 14.  ABG showed a pH of 7.024 with PCO2 of 23 PO2 of 108 with a bicarbonate of 6 and anion gap of 23.   Past Medical History  Hidradenitis suppurativa  DM HTN  Significant Hospital Events   4/6: admitted to ICU   Consults:  Nephrology   Procedures:  N/A  Significant Diagnostic Tests:  CXR 4/6: Lungs clear without pleural effusion.   Renal u/s 4/7: Normal renal ultrasound. No hydronephrosis or other significant finding.  Micro Data:  Blood Cx 4/6: 1 anaerobic bottle of 4 bottles grew G(+) rods Urine Cx 4/7: Negative  Antimicrobials:  Vancomycin 2500 mg 4/6>>4/6 Piperacillin-tazobactam 3.375 g 4/6>>  4/7 Ceftriaxone 2g 4/7>>4/9  Interim history/subjective:  Pt awake lying in bed with labored respirations. Spouse updated at bedside. She reports pt's baseline in not altered. Prior to hospitalization he has been alert and oriented. Pt has had 0 UP this am even with foley catheter.   [name] endorses diffuse abdominal pain with feeling of needing to urinate, but not being able to.    Objective   Blood pressure 139/82, pulse (!) 115, temperature 99.3 F (37.4 C), temperature source Axillary, resp. rate (!) 31, height 5\' 9"  (1.753 m), weight 131.9 kg, SpO2 100 %.        Intake/Output Summary (Last 24 hours) at 01/17/2020 0808 Last data filed at 01/17/2020 0600 Gross per 24 hour  Intake 6075.61 ml  Output 2775 ml  Net 3300.61 ml   Filed Weights   01/15/20 0156 01/16/20 0241 01/17/20 0451  Weight: 125.6 kg 128.7 kg 131.9 kg   Physical Exam: General: Distressed appearing male awake and lying in bed with spouse at bed side.  HEENT: Normocephalic head with soft and supple neck.  CV: Tachycardic with no murmurs or gallops. Lungs:Clear to auscultation bilaterally. Minimal usage of accessory muscles and tachypneic.  Abd: Soft and non-distended without guarding to deep palpation. TTP at bilateral lower quadrants Neuro: Tracks with eyes. Spontaneously moves extremities.  GU: groin with hidradenitis with no signs of infection.  Extremities: No LEE. 2+ DP pulses. Visible skin without rashes.   [facility]  Problem list   None   Assessment & Plan:   AGMA DKA:  Pt presented with glucose of 734, gap acidosis with elevated beta-hydroxybutyric acid of >7.00. Initial ABG showed metabolic acidosis with pH of 7.02 most consistent with DKA, rather than HHS. Pt with recent diagnosis of pre-diabetes on metformin. On admission, HgbA1c was 11.2 consistent with diabetes. Today, his glucose is 214 with a gap of 16 and BHB is nl at 0.07. Pt still on insulin gtt and have received a total of 15 L of  IVF since admission.   DKA at this point has resolved, gap likely from another source. Given his urinary retention, elevated BUN of 60 and creatinine of 2.44, low potassium of 2.9 his open gap likely from sporadic RTA 1, 2 versus uremia. Other sources of [place] still on the differential.   - BMP q8h - Replete K+, bicarb and phosphorous  - Continue insulin gtt, switch to subq insulin when gap closes x2 and patient can tolerate PO - Switched froom D5-1/2NS to D5-LR this am  Leukocytosis:  Pt afebrile but has an elevated WBC of 13.8 downtrending from initial 23.9. Etiology likely from DKA. 1/4 bcx with growth of g+ rods in a anaerobic bottle likely not from bacteremia and Ucx with no growth.  - d/c ceftriaxone   AKI: BUN at 60 Creatinine at 2.4 elevated.  Per chart review, lab work from 2 months ago shows WNL creatinine of 0.72 and BUN of 10. No evidence of chronic electrolyte abnormalities.  Concerning glomerulonephritis versus drug-related AKI  - Daily BMP q8h - Follow protein:creatinine and microalbumin:creatinine ratio - Discuss with Nephrology today   Urinary retention: Unknown etiology at this time. Obstruction ruled out on renal ultrasound.  Pt's bladder scan revealed 917 ml of retention yesterday and foley was placed. Pt reports sensation to urinate but is unable to urinate. Bladder scan repeated today and foley flushes. He had significantly large urine output. Concern that foley is being clogged by sediment. - Continue foley with frequent flushes   Thrombocytopenia  No past medical history of this. On admission, platelets were 203 and have since decreased down to 101 today. He is having urinary bleeding. Will monitor closely.  - CBC daily   Hidradenitis suppurative (Hurley Stage 3)  - continue to monitor for signs of infection   Suspected undiagnosed obstructive sleep apnea:  - avoid anxiolytics  - consider outpatient polysomnography at discharge   Best practice:  Diet:  NPO Pain/Anxiety/Delirium protocol (if indicated): n/a VAP protocol (if indicated): in place DVT prophylaxis: heparin  GI prophylaxis: pantoprazole  Glucose control: insulin gtt Mobility: BR Code Status: Full Family Communication: Spouse updated this am and during rounds  Disposition: ICU  Labs   CBC: Recent Labs  Lab 01/14/20 2225 01/14/20 2225 01/15/20 0330 01/15/20 0629 01/16/20 0701 01/16/20 1856 01/17/20 0011  WBC 27.5*  --  23.9*  --  12.7* 13.5* 13.8*  NEUTROABS  --   --   --   --  10.3*  --  11.3*  HGB 14.2   < > 12.4* 11.6* 11.8* 10.8* 11.0*  HCT 40.5   < > 34.2* 34.0* 32.4* 29.0* 29.6*  MCV 92.9  --  91.0  --  89.0 86.6 87.1  PLT 194  --  159  --  91* 96* 101*   < > = values in this interval not displayed.    Basic Metabolic Panel: Recent Labs  Lab 01/15/20 0330 01/15/20 0546 01/15/20 1255 01/15/20 1458 01/15/20 2226  01/16/20 0246 01/16/20 0701 01/16/20 1417 01/17/20 0011  NA 129*   < >  --    < > 128* 130* 128* 131* 127*  K 2.5*   < >  --    < > 2.8* 2.6* 3.2* 2.9* 3.4*  CL 101   < >  --    < > 103 104 102 104 104  CO2 10*   < >  --    < > 11* 10* 9* 9* 7*  GLUCOSE 249*   < >  --    < > 232* 218* 208* 227* 247*  BUN 57*   < >  --    < > 65* 65* 65* 64* 61*  CREATININE 2.30*   < >  --    < > 2.32* 2.31* 2.31* 2.41* 2.40*  CALCIUM 8.5*   < >  --    < > 8.0* 8.0* 7.6* 8.1* 8.0*  MG 2.2  --   --   --   --   --   --   --  2.0  PHOS 1.3*  --  1.4*  --   --   --   --  1.1* 1.9*   < > = values in this interval not displayed.   GFR: Estimated Creatinine Clearance: 51.8 mL/min (A) (by C-G formula based on SCr of 2.4 mg/dL (H)). Recent Labs  Lab 01/14/20 2225 01/14/20 2225 01/15/20 0330 01/16/20 0701 01/16/20 1856 01/17/20 0011  PROCALCITON 47.90  --   --   --   --   --   WBC 27.5*   < > 23.9* 12.7* 13.5* 13.8*  LATICACIDVEN 4.2*  --  2.2*  --   --   --    < > = values in this interval not displayed.    Liver Function Tests: Recent Labs  Lab  01/14/20 1930  AST 29  ALT 28  ALKPHOS 185*  BILITOT 3.3*  PROT 8.9*  ALBUMIN 2.5*   Recent Labs  Lab 01/14/20 1930  LIPASE 40   No results for input(s): AMMONIA in the last 168 hours.  ABG    Component Value Date/Time   PHART 7.308 (L) 01/15/2020 0629   PCO2ART 24.2 (L) 01/15/2020 0629   PO2ART 89.0 01/15/2020 0629   HCO3 12.2 (L) 01/15/2020 0629   TCO2 13 (L) 01/15/2020 0629   ACIDBASEDEF 13.0 (H) 01/15/2020 0629   O2SAT 96.0 01/15/2020 0629     Coagulation Profile: No results for input(s): INR, PROTIME in the last 168 hours.  Cardiac Enzymes: Recent Labs  Lab 01/16/20 1856  CKTOTAL 66    HbA1C: Hgb A1c MFr Bld  Date/Time Value Ref Range Status  01/14/2020 10:25 PM 11.2 (H) 4.8 - 5.6 % Final    Comment:    (NOTE)         Prediabetes: 5.7 - 6.4         Diabetes: >6.4         Glycemic control for adults with diabetes: <7.0   01/26/2016 02:48 PM 5.9 (H) 4.8 - 5.6 % Final    Comment:    (NOTE)         Pre-diabetes: 5.7 - 6.4         Diabetes: >6.4         Glycemic control for adults with diabetes: <7.0     CBG: Recent Labs  Lab 01/17/20 0223 01/17/20 0329 01/17/20 0447 01/17/20 0619 01/17/20 0718  GLUCAP 232* 215* 227* 231* 210*  Tomma Rakers, MS4  Dr. Verdene Lennert Internal Medicine PGY-1  Pager: [phone] 01/17/2020, 1:38 PM   Pulmonary critical care attending:  44 year old gentleman admitted for diabetic ketoacidosis, hyperglycemia and confusion.  Patient's mental status is improved.  Unfortunately had lots of sediment in his urine and difficulty flushing the Foley catheter.  This was fixed today and he had 1400 of urine output today.  BP (!) 141/114   Pulse (!) 119   Temp 99.3 F (37.4 C) (Axillary)   Resp (!) 22   Ht 5\' 9"  (1.753 m)   Wt 131.9 kg   SpO2 98%   BMI 42.94 kg/m   General: Young male resting in bed, wife at bedside.  Patient states that he feels better today.  HEENT: Tracking appropriately Heart: Regular  rhythm S1-S2 Lungs: No crackles no wheeze Abdomen: Obese, slightly distended, pain with lower abdominal palpation.  Labs: Reviewed, serum creatinine stable, serum bicarb remains low  Assessment: Acidosis, resolved Anion gap metabolic acidosis persistent, Uremia Acute renal failure Hematuria Foley obstructed with sediment urinary retention Hidradenitis Hypokalemia Hypophosphatemia  Plan: Case discussed briefly with nephrology. Start on IV bicarbonate infusion. Flush Foley, discussed with nursing May need routine Foley flushing and irrigation to ensure does not clog Remains on insulin infusion, hyperglycemia and acidosis persist Replete hypokalemia Replete phosphorus  This patient is critically ill with multiple organ system failure; which, requires frequent high complexity decision making, assessment, support, evaluation, and titration of therapies. This was completed through the application of advanced monitoring technologies and extensive interpretation of multiple databases. During this encounter critical care time was devoted to patient care services described in this note for 32 minutes.  , DO [place] [facility] 01/17/2020 6:07 PM

## 2020-01-17 NOTE — Progress Notes (Signed)
PHARMACY - PHYSICIAN COMMUNICATION CRITICAL VALUE ALERT - BLOOD CULTURE IDENTIFICATION (BCID)  Glenn Murphy is an 44 y.o. male with h/o hidradenitis who presented to [facility] on 01/14/2020 with a chief complaint of HHS and sepsis  Assessment:   1/2 blood cultures growing Gram Positive Rods  Name of physician (or Provider) Contacted:  Dr. Warrick Parisian  Current antibiotics: Rocephin  Changes to prescribed antibiotics recommended:  No changes recommended at this time.  No results found for this or any previous visit.  [name] 01/17/2020  6:26 AM

## 2020-01-17 NOTE — Progress Notes (Signed)
   01/17/20 0030  Urine Characteristics  Urinary Interventions Bladder scan  Bladder Scan Volume (mL) 917 mL   MD Ogan called, orders received for foley catheter.

## 2020-01-18 LAB — CBC WITH DIFFERENTIAL/PLATELET
Abs Immature Granulocytes: 0.83 10*3/uL — ABNORMAL HIGH (ref 0.00–0.07)
Basophils Absolute: 0 10*3/uL (ref 0.0–0.1)
Basophils Relative: 0 %
Eosinophils Absolute: 0 10*3/uL (ref 0.0–0.5)
Eosinophils Relative: 0 %
HCT: 27.8 % — ABNORMAL LOW (ref 39.0–52.0)
Hemoglobin: 10.3 g/dL — ABNORMAL LOW (ref 13.0–17.0)
Immature Granulocytes: 7 %
Lymphocytes Relative: 15 %
Lymphs Abs: 1.7 10*3/uL (ref 0.7–4.0)
MCH: 32.9 pg (ref 26.0–34.0)
MCHC: 37.1 g/dL — ABNORMAL HIGH (ref 30.0–36.0)
MCV: 88.8 fL (ref 80.0–100.0)
Monocytes Absolute: 0.8 10*3/uL (ref 0.1–1.0)
Monocytes Relative: 7 %
Neutro Abs: 8.1 10*3/uL — ABNORMAL HIGH (ref 1.7–7.7)
Neutrophils Relative %: 71 %
Platelets: 116 10*3/uL — ABNORMAL LOW (ref 150–400)
RBC: 3.13 MIL/uL — ABNORMAL LOW (ref 4.22–5.81)
RDW: 14.4 % (ref 11.5–15.5)
WBC: 11.5 10*3/uL — ABNORMAL HIGH (ref 4.0–10.5)
nRBC: 0 % (ref 0.0–0.2)

## 2020-01-18 LAB — ENA+DNA/DS+ANTICH+CENTRO+JO...
Anti JO-1: <0.2 AI (ref 0.0–0.9)
Centromere Ab Screen: <0.2 AI (ref 0.0–0.9)
Chromatin Ab SerPl-aCnc: <0.2 AI (ref 0.0–0.9)
ENA SM Ab Ser-aCnc: 0.7 AI (ref 0.0–0.9)
Ribonucleic Protein: 3.8 AI — ABNORMAL HIGH (ref 0.0–0.9)
SSA (Ro) (ENA) Antibody, IgG: <0.2 AI (ref 0.0–0.9)
SSB (La) (ENA) Antibody, IgG: <0.2 AI (ref 0.0–0.9)
Scleroderma (Scl-70) (ENA) Antibody, IgG: <0.2 AI (ref 0.0–0.9)
ds DNA Ab: <1 IU/mL (ref 0–9)

## 2020-01-18 LAB — BASIC METABOLIC PANEL
Anion gap: 13 (ref 5–15)
Anion gap: 13 (ref 5–15)
Anion gap: 13 (ref 5–15)
BUN: 56 mg/dL — ABNORMAL HIGH (ref 6–20)
BUN: 54 mg/dL — ABNORMAL HIGH (ref 6–20)
BUN: 50 mg/dL — ABNORMAL HIGH (ref 6–20)
CO2: 11 mmol/L — ABNORMAL LOW (ref 22–32)
CO2: 12 mmol/L — ABNORMAL LOW (ref 22–32)
CO2: 13 mmol/L — ABNORMAL LOW (ref 22–32)
Calcium: 8.2 mg/dL — ABNORMAL LOW (ref 8.9–10.3)
Calcium: 8 mg/dL — ABNORMAL LOW (ref 8.9–10.3)
Calcium: 7.7 mg/dL — ABNORMAL LOW (ref 8.9–10.3)
Chloride: 113 mmol/L — ABNORMAL HIGH (ref 98–111)
Chloride: 115 mmol/L — ABNORMAL HIGH (ref 98–111)
Chloride: 110 mmol/L (ref 98–111)
Creatinine, Ser: 2.21 mg/dL — ABNORMAL HIGH (ref 0.61–1.24)
Creatinine, Ser: 2.11 mg/dL — ABNORMAL HIGH (ref 0.61–1.24)
Creatinine, Ser: 1.93 mg/dL — ABNORMAL HIGH (ref 0.61–1.24)
GFR calc Af Amer: 40 mL/min — ABNORMAL LOW (ref >60)
GFR calc Af Amer: 42 mL/min — ABNORMAL LOW (ref >60)
GFR calc Af Amer: 47 mL/min — ABNORMAL LOW (ref >60)
GFR calc non Af Amer: 34 mL/min — ABNORMAL LOW (ref >60)
GFR calc non Af Amer: 36 mL/min — ABNORMAL LOW (ref >60)
GFR calc non Af Amer: 41 mL/min — ABNORMAL LOW (ref >60)
Glucose, Bld: 179 mg/dL — ABNORMAL HIGH (ref 70–99)
Glucose, Bld: 186 mg/dL — ABNORMAL HIGH (ref 70–99)
Glucose, Bld: 201 mg/dL — ABNORMAL HIGH (ref 70–99)
Potassium: 4 mmol/L (ref 3.5–5.1)
Potassium: 3 mmol/L — ABNORMAL LOW (ref 3.5–5.1)
Potassium: 3.1 mmol/L — ABNORMAL LOW (ref 3.5–5.1)
Sodium: 137 mmol/L (ref 135–145)
Sodium: 140 mmol/L (ref 135–145)
Sodium: 136 mmol/L (ref 135–145)

## 2020-01-18 LAB — GLUCOSE, CAPILLARY
Glucose-Capillary: 167 mg/dL — ABNORMAL HIGH (ref 70–99)
Glucose-Capillary: 186 mg/dL — ABNORMAL HIGH (ref 70–99)
Glucose-Capillary: 167 mg/dL — ABNORMAL HIGH (ref 70–99)
Glucose-Capillary: 193 mg/dL — ABNORMAL HIGH (ref 70–99)
Glucose-Capillary: 183 mg/dL — ABNORMAL HIGH (ref 70–99)
Glucose-Capillary: 173 mg/dL — ABNORMAL HIGH (ref 70–99)
Glucose-Capillary: 190 mg/dL — ABNORMAL HIGH (ref 70–99)
Glucose-Capillary: 165 mg/dL — ABNORMAL HIGH (ref 70–99)
Glucose-Capillary: 161 mg/dL — ABNORMAL HIGH (ref 70–99)
Glucose-Capillary: 176 mg/dL — ABNORMAL HIGH (ref 70–99)
Glucose-Capillary: 170 mg/dL — ABNORMAL HIGH (ref 70–99)
Glucose-Capillary: 172 mg/dL — ABNORMAL HIGH (ref 70–99)
Glucose-Capillary: 224 mg/dL — ABNORMAL HIGH (ref 70–99)

## 2020-01-18 LAB — MICROALBUMIN / CREATININE URINE RATIO
Creatinine, Urine: 39.9 mg/dL
Microalb Creat Ratio: 238 mg/g creat — ABNORMAL HIGH (ref 0–29)
Microalb, Ur: 95.1 ug/mL — ABNORMAL HIGH

## 2020-01-18 LAB — HEPATITIS B CORE ANTIBODY, IGM: Hep B C IgM: NONREACTIVE

## 2020-01-18 LAB — HEPATITIS C ANTIBODY: HCV Ab: NONREACTIVE

## 2020-01-18 LAB — HEPATITIS B SURFACE ANTIBODY, QUANTITATIVE: Hep B S AB Quant (Post): <3.1 m[IU]/mL — ABNORMAL LOW (ref >9.9)

## 2020-01-18 LAB — ANA W/REFLEX IF POSITIVE: Anti Nuclear Antibody (ANA): POSITIVE — AB

## 2020-01-18 LAB — C4 COMPLEMENT: Complement C4, Body Fluid: 34 mg/dL (ref 12–38)

## 2020-01-18 LAB — C3 COMPLEMENT: C3 Complement: 87 mg/dL (ref 82–167)

## 2020-01-18 MED ORDER — INSULIN ASPART 100 UNIT/ML ~~LOC~~ SOLN
0.0000 [IU] | Freq: Three times a day (TID) | SUBCUTANEOUS | Status: DC
  Administered 2020-01-18: 4 [IU] via SUBCUTANEOUS
  Administered 2020-01-19: 7 [IU] via SUBCUTANEOUS

## 2020-01-18 MED ORDER — INSULIN ASPART 100 UNIT/ML ~~LOC~~ SOLN
0.0000 [IU] | Freq: Every day | SUBCUTANEOUS | Status: DC
  Administered 2020-01-18: 2 [IU] via SUBCUTANEOUS

## 2020-01-18 MED ORDER — POTASSIUM CHLORIDE CRYS ER 20 MEQ PO TBCR
40.0000 meq | EXTENDED_RELEASE_TABLET | Freq: Two times a day (BID) | ORAL | Status: AC
  Administered 2020-01-18 – 2020-01-19 (×2): 40 meq via ORAL
  Filled 2020-01-18 (×2): qty 2

## 2020-01-18 MED ORDER — INSULIN ASPART 100 UNIT/ML ~~LOC~~ SOLN
6.0000 [IU] | Freq: Three times a day (TID) | SUBCUTANEOUS | Status: DC
  Administered 2020-01-18 – 2020-01-20 (×7): 6 [IU] via SUBCUTANEOUS

## 2020-01-18 MED ORDER — INSULIN DETEMIR 100 UNIT/ML ~~LOC~~ SOLN: 18.0000 [IU] | Freq: Two times a day (BID) | SUBCUTANEOUS | Status: DC

## 2020-01-18 MED ORDER — INSULIN ASPART 100 UNIT/ML ~~LOC~~ SOLN: 3.0000 [IU] | SUBCUTANEOUS | Status: DC

## 2020-01-18 MED ORDER — INSULIN DETEMIR 100 UNIT/ML ~~LOC~~ SOLN
20.0000 [IU] | Freq: Two times a day (BID) | SUBCUTANEOUS | Status: DC
  Administered 2020-01-18 (×2): 20 [IU] via SUBCUTANEOUS
  Filled 2020-01-18 (×4): qty 0.2

## 2020-01-18 NOTE — Progress Notes (Addendum)
NAME:  Glenn Murphy, MRN:  [id number], DOB:  Oct 16, 1975, LOS: 4 ADMISSION DATE:  01/14/2020, CONSULTATION DATE:  01/14/2020 REFERRING MD:  Redge Gainer, CHIEF COMPLAINT:  "Hurting all over"  Brief History   Patient is a 44 year old male with lifelong history of hidradenitis brought to the emergency room with hyperglycemia, elevated BHB and anion gap metabolic acidosis.  History of present illness   Patient is a 44 year old male with a lifelong history of hidradenitis who in February was placed on Accutane and prednisone.  Prior to that he been on continuous antibiotics most recently doxycycline for about a year.  On an evaluation at [facility] on 3/26 he was having blurry vision felt secondary to Accutane with little clinical improvement along with loss of appetite.  The Accutane was stopped on that visit.  Notes on that visit indicate he was started on Humira but it is not clear as to whether he has been taking it or not.  He was also started on clindamycin gel.  He also was started on Metformin in February.  On evaluation in the emergency room the patient complained of thirst and hurting all over.  Lab results showed a sodium of 124 with a bicarb less than 7, glucose of 734, BHB >8 and creatinine of 2.04.  He also had a white count of 26.1 hemoglobin 14.  ABG showed a pH of 7.024 with PCO2 of 23 PO2 of 108 with a bicarbonate of 6 and anion gap of 23.   Past Medical History  Hidradenitis suppurativa  DM HTN  Significant Hospital Events   4/6: admitted to ICU   Consults:  Nephrology   Procedures:  N/A  Significant Diagnostic Tests:  CXR 4/6: Lungs clear without pleural effusion.   Renal u/s 4/7: Normal renal ultrasound. No hydronephrosis or other significant finding.  Micro Data:  Blood Cx 4/6: 1 anaerobic bottle of 4 bottles grew G(+) rods, speciation still pending  Urine Cx 4/7: Negative  Antimicrobials:  Vancomycin 2500 mg  4/6>>4/6 Piperacillin-tazobactam 3.375 g 4/6>> 4/7 Ceftriaxone 2g 4/7>>4/9  Interim history/subjective:    Glenn Murphy states he is feeling better this AM. He is looking forward to eating. He denies any abdominal pain at this time.    Objective   Blood pressure 130/69, pulse (!) 116, temperature 98.4 F (36.9 C), temperature source Oral, resp. rate (!) 21, height 5\' 9"  (1.753 m), weight 132.1 kg, SpO2 100 %.        Intake/Output Summary (Last 24 hours) at 01/18/2020 0710 Last data filed at 01/18/2020 0600 Gross per 24 hour  Intake 3186.02 ml  Output 4480 ml  Net -1293.98 ml   Filed Weights   01/16/20 0241 01/17/20 0451 01/18/20 0453  Weight: 128.7 kg 131.9 kg 132.1 kg   Physical Exam Vitals and nursing note reviewed.  Constitutional:      General: He is not in acute distress.    Appearance: He is obese.     Interventions: He is not intubated. HENT:     Head: Normocephalic and atraumatic.  Cardiovascular:     Rate and Rhythm: Regular rhythm. Tachycardia present.  Pulmonary:     Effort: Tachypnea and accessory muscle usage present. He is not intubated.     Breath sounds: No decreased breath sounds, wheezing, rhonchi or rales.  Abdominal:     Palpations: Abdomen is soft.     Tenderness: There is no abdominal tenderness. There is no guarding.  Musculoskeletal:  Right lower leg: No tenderness. No edema.     Left lower leg: No tenderness. No edema.  Skin:    General: Skin is warm and dry.  Neurological:     General: No focal deficit present.     Mental Status: He is alert and oriented to person, place, and time.  Psychiatric:        Behavior: Behavior normal.    Resolved Hospital Problem list   None   Assessment & Plan:   AGMA: Multifactorial  DKA  Multifactorial including DKA and uremia. DKA has resolved, as BHB is WNL. Gap closed x2 this AM and tolerating diet.   - BMP q8h - Transitioning to subq insulin today (Levemir 20 units BID, Novolog SSI-R, 6 units  Novolog with each meal, HS correction)  - Discontinue IVF - Sodium bicarb 100 mEq infusion @ 100 cc/hr  AKI: Per chart review, lab work from 2 months ago shows WNL creatinine of 0.72 and BUN of 10. No evidence of chronic electrolyte abnormalities.  Concerning glomerulonephritis versus drug-related AKI. No significant proteinuria.  Hepatitis B and C negative. ANA, ANCA, complement work up still pending   - Daily BMP q8h - Monitor urine output    Urinary retention: Unknown etiology at this time. Obstruction ruled out on renal ultrasound.  Concern that foley is being clogged by sediment.  - Continue foley with frequent flushes   Thrombocytopenia  No past medical history of this. On admission, platelets were 203 and decreased steadily. He is having urinary bleeding. Will monitor closely. Improved today.   - CBC daily  Hyperbilirubinemia - Hepatitis B and C negative. - Continue to monitor   Leukocytosis:  Pt afebrile. WBC downtrending from initial 23.9. Etiology likely from DKA. 1/4 bcx growing GPR, very likely contaminant. Ucx with no growth.   Hidradenitis suppurative ([place]ur[place] Stage 3)  - continue to monitor for signs of infection   Suspected undiagnosed obstructive sleep apnea:  - avoid anxiolytics  - consider outpatient polysomnography at discharge   Best practice:  Diet: NPO Pain/Anxiety/Delirium protocol (if indicated): n/a VAP protocol (if indicated): in place DVT prophylaxis: heparin  GI prophylaxis: pantoprazole  Glucose control: [place] insulin  Mobility: BR Code Status: Full Family Communication: Spouse updated this am.  Disposition: ICU  Labs   CBC: Recent Labs  Lab 01/15/20 0330 01/15/20 0330 01/15/20 0629 01/16/20 0701 01/16/20 1856 01/17/20 0011 01/18/20 0030  WBC 23.9*  --   --  12.7* 13.5* 13.8* 11.5*  NEUTROABS  --   --   --  10.3*  --  11.3* 8.1*  HGB 12.4*   < > 11.6* 11.8* 10.8* 11.0* 10.3*  HCT 34.2*   < > 34.0* 32.4* 29.0* 29.6* 27.8*  MCV  91.0  --   --  89.0 86.6 87.1 88.8  PLT 159  --   --  91* 96* 101* 116*   < > = values in this interval not displayed.    Basic Metabolic Panel: Recent Labs  Lab 01/15/20 0330 01/15/20 0546 01/15/20 1255 01/15/20 1458 01/16/20 1417 01/17/20 0011 01/17/20 0809 01/17/20 1651 01/18/20 0030  NA 129*   < >  --    < > 131* 127* 134* 136 137  K 2.5*   < >  --    < > 2.9* 3.4* 2.9* 3.2* 4.0  CL 101   < >  --    < > 104 104 109 113* 113*  CO2 10*   < >  --    < >  9* 7* 9* 10* 11*  GLUCOSE 249*   < >  --    < > 227* 247* 221* 221* 179*  BUN 57*   < >  --    < > 64* 61* 60* 57* 56*  CREATININE 2.30*   < >  --    < > 2.41* 2.40* 2.44* 2.31* 2.21*  CALCIUM 8.5*   < >  --    < > 8.1* 8.0* 8.5* 8.5* 8.2*  MG 2.2  --   --   --   --  2.0  --   --   --   PHOS 1.3*  --  1.4*  --  1.1* 1.9*  --   --   --    < > = values in this interval not displayed.   GFR: Estimated Creatinine Clearance: 56.3 mL/min (A) (by C-G formula based on SCr of 2.21 mg/dL (H)). Recent Labs  Lab 01/14/20 2225 01/14/20 2225 01/15/20 0330 01/15/20 0330 01/16/20 0701 01/16/20 1856 01/17/20 0011 01/18/20 0030  PROCALCITON 47.90  --   --   --   --   --   --   --   WBC 27.5*   < > 23.9*   < > 12.7* 13.5* 13.8* 11.5*  LATICACIDVEN 4.2*  --  2.2*  --   --   --   --   --    < > = values in this interval not displayed.    Liver Function Tests: Recent Labs  Lab 01/14/20 1930 01/17/20 1828  AST 29 24  ALT 28 23  ALKPHOS 185* 127*  BILITOT 3.3* 3.5*  PROT 8.9* 7.6  ALBUMIN 2.5* 1.6*   Recent Labs  Lab 01/14/20 1930  LIPASE 40   No results for input(s): AMMONIA in the last 168 hours.  ABG    Component Value Date/Time   PHART 7.308 (L) 01/15/2020 0629   PCO2ART 24.2 (L) 01/15/2020 0629   PO2ART 89.0 01/15/2020 0629   HCO3 12.2 (L) 01/15/2020 0629   TCO2 13 (L) 01/15/2020 0629   ACIDBASEDEF 13.0 (H) 01/15/2020 0629   O2SAT 96.0 01/15/2020 0629     Coagulation Profile: No results for input(s): INR,  PROTIME in the last 168 hours.  Cardiac Enzymes: Recent Labs  Lab 01/16/20 1856  CKTOTAL 66    HbA1C: Hgb A1c MFr Bld  Date/Time Value Ref Range Status  01/14/2020 10:25 PM 11.2 (H) 4.8 - 5.6 % Final    Comment:    (NOTE)         Prediabetes: 5.7 - 6.4         Diabetes: >6.4         Glycemic control for adults with diabetes: <7.0   01/26/2016 02:48 PM 5.9 (H) 4.8 - 5.6 % Final    Comment:    (NOTE)         Pre-diabetes: 5.7 - 6.4         Diabetes: >6.4         Glycemic control for adults with diabetes: <7.0     CBG: Recent Labs  Lab 01/18/20 0155 01/18/20 0258 01/18/20 0433 01/18/20 0531 01/18/20 0651  GLUCAP 186* 167* 193* 183* 173*   Dr. Verdene Lennert Internal Medicine PGY-1  Pager: [phone] 01/18/2020, 7:10 AM   PCCM attending:   44 yo admitted for dka, ARF, persistent acidosis  Started bicarb ggt yesterday  Good UOP Foley with sediment requiring flushing   BP 139/81   Pulse (!) 114  Temp 98.4 F (36.9 C) (Oral)   Resp (!) 30   Ht 5\' 9"  (1.753 m)   Wt 132.1 kg   SpO2 99%   BMI 43.01 kg/m   Gen: resting in bed, no distress HENT: tracking Heart: rrr, s1 s2 Lungs: CTAB no wheeze   Labs reviewed, bicarb better   A: DKA - resolved  Acidosis improving  AGMA  Uremia  ARF  Hematuria  Foley with sediment   P: Continue bicarb infusion  Keep foley flushed  PT/OT  Transition from insulin ggt to sq  Replete lytes as needed   , DO [facility] 01/18/2020 2:53 PM

## 2020-01-19 LAB — CULTURE, BLOOD (ROUTINE X 2): Culture: NO GROWTH

## 2020-01-19 LAB — BASIC METABOLIC PANEL
Anion gap: 12 (ref 5–15)
Anion gap: 13 (ref 5–15)
BUN: 44 mg/dL — ABNORMAL HIGH (ref 6–20)
BUN: 42 mg/dL — ABNORMAL HIGH (ref 6–20)
CO2: 15 mmol/L — ABNORMAL LOW (ref 22–32)
CO2: 14 mmol/L — ABNORMAL LOW (ref 22–32)
Calcium: 7.5 mg/dL — ABNORMAL LOW (ref 8.9–10.3)
Calcium: 7.3 mg/dL — ABNORMAL LOW (ref 8.9–10.3)
Chloride: 110 mmol/L (ref 98–111)
Chloride: 110 mmol/L (ref 98–111)
Creatinine, Ser: 1.84 mg/dL — ABNORMAL HIGH (ref 0.61–1.24)
Creatinine, Ser: 1.7 mg/dL — ABNORMAL HIGH (ref 0.61–1.24)
GFR calc Af Amer: 50 mL/min — ABNORMAL LOW (ref >60)
GFR calc Af Amer: 55 mL/min — ABNORMAL LOW (ref >60)
GFR calc non Af Amer: 43 mL/min — ABNORMAL LOW (ref >60)
GFR calc non Af Amer: 47 mL/min — ABNORMAL LOW (ref >60)
Glucose, Bld: 214 mg/dL — ABNORMAL HIGH (ref 70–99)
Glucose, Bld: 225 mg/dL — ABNORMAL HIGH (ref 70–99)
Potassium: 3 mmol/L — ABNORMAL LOW (ref 3.5–5.1)
Potassium: 3.7 mmol/L (ref 3.5–5.1)
Sodium: 137 mmol/L (ref 135–145)
Sodium: 137 mmol/L (ref 135–145)

## 2020-01-19 LAB — CBC WITH DIFFERENTIAL/PLATELET
Abs Immature Granulocytes: 0.45 10*3/uL — ABNORMAL HIGH (ref 0.00–0.07)
Basophils Absolute: 0.1 10*3/uL (ref 0.0–0.1)
Basophils Relative: 1 %
Eosinophils Absolute: 0.1 10*3/uL (ref 0.0–0.5)
Eosinophils Relative: 1 %
HCT: 28.1 % — ABNORMAL LOW (ref 39.0–52.0)
Hemoglobin: 10.3 g/dL — ABNORMAL LOW (ref 13.0–17.0)
Immature Granulocytes: 5 %
Lymphocytes Relative: 17 %
Lymphs Abs: 1.4 10*3/uL (ref 0.7–4.0)
MCH: 32.9 pg (ref 26.0–34.0)
MCHC: 36.7 g/dL — ABNORMAL HIGH (ref 30.0–36.0)
MCV: 89.8 fL (ref 80.0–100.0)
Monocytes Absolute: 0.5 10*3/uL (ref 0.1–1.0)
Monocytes Relative: 6 %
Neutro Abs: 6.1 10*3/uL (ref 1.7–7.7)
Neutrophils Relative %: 70 %
Platelets: 115 10*3/uL — ABNORMAL LOW (ref 150–400)
RBC: 3.13 MIL/uL — ABNORMAL LOW (ref 4.22–5.81)
RDW: 14.3 % (ref 11.5–15.5)
WBC: 8.6 10*3/uL (ref 4.0–10.5)
nRBC: 0 % (ref 0.0–0.2)

## 2020-01-19 LAB — MAGNESIUM: Magnesium: 2.1 mg/dL (ref 1.7–2.4)

## 2020-01-19 LAB — POTASSIUM: Potassium: 3.4 mmol/L — ABNORMAL LOW (ref 3.5–5.1)

## 2020-01-19 LAB — HEPATIC FUNCTION PANEL
ALT: 24 U/L (ref 0–44)
AST: 35 U/L (ref 15–41)
Albumin: 1.4 g/dL — ABNORMAL LOW (ref 3.5–5.0)
Alkaline Phosphatase: 134 U/L — ABNORMAL HIGH (ref 38–126)
Bilirubin, Direct: 2.2 mg/dL — ABNORMAL HIGH (ref 0.0–0.2)
Indirect Bilirubin: 1.5 mg/dL — ABNORMAL HIGH (ref 0.3–0.9)
Total Bilirubin: 3.7 mg/dL — ABNORMAL HIGH (ref 0.3–1.2)
Total Protein: 7.8 g/dL (ref 6.5–8.1)

## 2020-01-19 LAB — GLUCOSE, CAPILLARY
Glucose-Capillary: 205 mg/dL — ABNORMAL HIGH (ref 70–99)
Glucose-Capillary: 179 mg/dL — ABNORMAL HIGH (ref 70–99)
Glucose-Capillary: 150 mg/dL — ABNORMAL HIGH (ref 70–99)
Glucose-Capillary: 113 mg/dL — ABNORMAL HIGH (ref 70–99)

## 2020-01-19 LAB — PHOSPHORUS: Phosphorus: 2.8 mg/dL (ref 2.5–4.6)

## 2020-01-19 LAB — MPO/PR-3 (ANCA) ANTIBODIES
ANCA Proteinase 3: <3.5 U/mL (ref 0.0–3.5)
Myeloperoxidase Abs: <9 U/mL (ref 0.0–9.0)

## 2020-01-19 MED ORDER — POTASSIUM CHLORIDE 20 MEQ/15ML (10%) PO SOLN
30.0000 meq | ORAL | Status: AC
  Administered 2020-01-19 (×2): 30 meq via ORAL
  Filled 2020-01-19 (×2): qty 30

## 2020-01-19 MED ORDER — INSULIN DETEMIR 100 UNIT/ML ~~LOC~~ SOLN
23.0000 [IU] | Freq: Two times a day (BID) | SUBCUTANEOUS | Status: DC
  Administered 2020-01-19 – 2020-01-20 (×3): 23 [IU] via SUBCUTANEOUS
  Filled 2020-01-19 (×4): qty 0.23

## 2020-01-19 MED ORDER — LOPERAMIDE HCL 2 MG PO CAPS
2.0000 mg | ORAL_CAPSULE | ORAL | Status: DC | PRN
  Administered 2020-01-19 – 2020-01-22 (×7): 2 mg via ORAL
  Filled 2020-01-19 (×8): qty 1

## 2020-01-19 MED ORDER — POTASSIUM CHLORIDE CRYS ER 20 MEQ PO TBCR: 30.0000 meq | EXTENDED_RELEASE_TABLET | ORAL | Status: DC

## 2020-01-19 MED ORDER — LACTATED RINGERS IV SOLN: INTRAVENOUS | Status: AC

## 2020-01-19 NOTE — Progress Notes (Signed)
[facility] ADULT ICU REPLACEMENT PROTOCOL FOR AM LAB REPLACEMENT ONLY  The patient does apply for the [facility] Adult ICU Electrolyte Replacment Protocol based on the criteria listed below:   1. Is GFR >/= 40 ml/min? Yes.    Patient's GFR today is 50 2. Is urine output >/= 0.5 ml/kg/hr for the last 6 hours? Yes.   Patient's UOP is 1.0 ml/kg/hr 3. Is BUN < 60 mg/dL? Yes.    Patient's BUN today is 44 4. Abnormal electrolyte(s): k 3.0 5. Ordered repletion with: protocol per mouth 6. If a panic level lab has been reported, has the CCM MD in charge been notified? No..   Physician:    Markus Daft A 01/19/2020 5:26 AM

## 2020-01-19 NOTE — Evaluation (Signed)
Physical Therapy Evaluation Patient Details Name: Glenn Murphy MRN: [id number] DOB: [birth year] Today's Date: 01/19/2020   History of Present Illness  Patient is a 44 year old male with lifelong history of hidradenitis brought to the emergency room with hyperglycemia, elevated BHB and anion gap metabolic acidosis.  Clinical Impression   Pt admitted with above diagnosis. Comes from home where he lives with his wife,  who works; resides in a single level home with 2-3 steps to enter; Normally wlks with a small based cane due to knee arthritis; Presents to PT with decr functional mobility, generalized weakness, decr activity tolerance; Min to mod assist for bed mobility and transfers; frequent bowels movements precluded ambulation today; Pt currently with functional limitations due to the deficits listed below (see PT Problem List). Pt will benefit from skilled PT to increase their independence and safety with mobility to allow discharge to the venue listed below.       Follow Up Recommendations Home health PT;Other (comment)(if slow progress, consider CIR)    Equipment Recommendations  Rolling walker with 5" wheels;3in1 (PT)    Recommendations for Other Services       Precautions / Restrictions Precautions Precautions: Fall      Mobility  Bed Mobility Overal bed mobility: Needs Assistance Bed Mobility: Supine to Sit;Sit to Supine     Supine to sit: Mod assist Sit to supine: Mod assist   General bed mobility comments: Light mod assist to sit up to EOB; good adjustment of trunk and hips to push up to sit with RUE; Mod assist to help LEs back onto bed to lay down  Transfers Overall transfer level: Needs assistance Equipment used: 2 person hand held assist;Quad cane Transfers: Sit to/from Stand Sit to Stand: Min assist;+2 safety/equipment         General transfer comment: Min assist to rise and steady  Ambulation/Gait                Stairs             Wheelchair Mobility    Modified Rankin (Stroke Patients Only)       Balance Overall balance assessment: Needs assistance Sitting-balance support: Feet supported Sitting balance-Leahy Scale: Fair       Standing balance-Leahy Scale: Poor Standing balance comment: Stood at EOB with noted incr sway in static standing                             Pertinent Vitals/Pain Pain Assessment: Faces Faces Pain Scale: Hurts even more Pain Location: bil knees Pain Descriptors / Indicators: Aching Pain Intervention(s): Monitored during session    Home Living Family/patient expects to be discharged to:: Private residence Living Arrangements: Spouse/significant other Available Help at Discharge: Family;Available PRN/intermittently(wife works) Type of Home: House Home Access: Stairs to enter Entrance Stairs-Rails: None;Left(no rails back door; rail L front door) Technical brewer of Steps: 2-3 Home Layout: One level Home Equipment: Cane - quad(small based)      Prior Function Level of Independence: Independent;Independent with assistive device(s)         Comments: 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; L knee worse than R, wears knee brace L typically     Hand Dominance        Extremity/Trunk Assessment   Upper Extremity Assessment Upper Extremity Assessment: Generalized weakness    Lower Extremity Assessment Lower Extremity Assessment: Generalized weakness       Communication   Communication: Other (comment)(softspoken)  Cognition Arousal/Alertness: Awake/alert Behavior During Therapy: WFL for tasks assessed/performed Overall Cognitive Status: Within Functional Limits for tasks assessed                                        General Comments General comments (skin integrity, edema, etc.): HR incr to 122 bpm with standing activity; BP sitting 106/88; BP standing 117/77; O2 sats 100%    Exercises     Assessment/Plan    PT  Assessment Patient needs continued PT services  PT Problem List Decreased strength;Decreased activity tolerance;Decreased range of motion;Decreased balance;Decreased mobility;Decreased coordination;Decreased knowledge of use of DME;Decreased safety awareness;Decreased knowledge of precautions;Pain       PT Treatment Interventions DME instruction;Gait training;Stair training;Functional mobility training;Therapeutic activities;Therapeutic exercise;Balance training;Patient/family education    PT Goals (Current goals can be found in the Care Plan section)  Acute Rehab PT Goals Patient Stated Goal: Did not state, but agreeable to try standing PT Goal Formulation: With patient Time For Goal Achievement: 02/02/20 Potential to Achieve Goals: Good    Frequency Min 3X/week   Barriers to discharge   Must be independent to dc home    Co-evaluation               AM-PAC PT "6 Clicks" Mobility  Outcome Measure Help needed turning from your back to your side while in a flat bed without using bedrails?: A Little Help needed moving from lying on your back to sitting on the side of a flat bed without using bedrails?: A Little Help needed moving to and from a bed to a chair (including a wheelchair)?: A Little Help needed standing up from a chair using your arms (e.g., wheelchair or bedside chair)?: A Little Help needed to walk in hospital room?: A Lot Help needed climbing 3-5 steps with a railing? : Total 6 Click Score: 15    End of Session Equipment Utilized During Treatment: Gait belt Activity Tolerance: Patient tolerated treatment well Patient left: in bed;with call bell/phone within reach;with nursing/sitter in room Nurse Communication: Mobility status PT Visit Diagnosis: Unsteadiness on feet (R26.81);Other abnormalities of gait and mobility (R26.89);Muscle weakness (generalized) (M62.81)    Time: 0454-0981 PT Time Calculation (min) (ACUTE ONLY): 24 min   Charges:   PT  Evaluation $PT Eval Moderate Complexity: 1 Mod PT Treatments $Therapeutic Activity: 8-22 mins        Van Clines, PT  Acute Rehabilitation Services Pager [phone] Office [phone]   Levi Aland 01/19/2020, 3:37 PM

## 2020-01-19 NOTE — Progress Notes (Addendum)
NAME:  Glenn Murphy, MRN:  [id number], DOB:  13-Feb-1976, LOS: 5 ADMISSION DATE:  01/14/2020, CONSULTATION DATE:  01/14/2020 REFERRING MD:  Redge Gainer, CHIEF COMPLAINT:  "Hurting all over"  Brief History   Patient is a 44 year old male with lifelong history of hidradenitis brought to the emergency room with hyperglycemia, elevated BHB and anion gap metabolic acidosis.  History of present illness   Patient is a 44 year old male with a lifelong history of hidradenitis who in February was placed on Accutane and prednisone.  Prior to that he been on continuous antibiotics most recently doxycycline for about a year.  On an evaluation at [facility] on 3/26 he was having blurry vision felt secondary to Accutane with little clinical improvement along with loss of appetite.  The Accutane was stopped on that visit.  Notes on that visit indicate he was started on Humira but it is not clear as to whether he has been taking it or not.  He was also started on clindamycin gel.  He also was started on Metformin in February.  On evaluation in the emergency room the patient complained of thirst and hurting all over.  Lab results showed a sodium of 124 with a bicarb less than 7, glucose of 734, BHB >8 and creatinine of 2.04.  He also had a white count of 26.1 hemoglobin 14.  ABG showed a pH of 7.024 with PCO2 of 23 PO2 of 108 with a bicarbonate of 6 and anion gap of 23.   Past Medical History  Hidradenitis suppurativa  DM HTN  Significant Hospital Events   4/6: admitted to ICU   Consults:  Nephrology   Procedures:  N/A  Significant Diagnostic Tests:  CXR 4/6: Lungs clear without pleural effusion.   Renal u/s 4/7: Normal renal ultrasound. No hydronephrosis or other significant finding.  Micro Data:  Blood Cx 4/6: 1 anaerobic bottle of 4 bottles grew G(+) rods, speciation still pending  Urine Cx 4/7: Negative  Antimicrobials:  Vancomycin 2500 mg  4/6>>4/6 Piperacillin-tazobactam 3.375 g 4/6>> 4/7 Ceftriaxone 2g 4/7>>4/9  Interim history/subjective:    Mr. Winemiller states he is feeling better this AM. He is looking forward to eating. He denies any abdominal pain at this time.    Objective   Blood pressure (!) 141/96, pulse (!) 107, temperature 97.6 F (36.4 C), temperature source Oral, resp. rate (!) 24, height 5\' 9"  (1.753 m), weight 132.1 kg, SpO2 100 %.        Intake/Output Summary (Last 24 hours) at 01/19/2020 0922 Last data filed at 01/19/2020 0700 Gross per 24 hour  Intake 1933.29 ml  Output 2800 ml  Net -866.71 ml   Filed Weights   01/16/20 0241 01/17/20 0451 01/18/20 0453  Weight: 128.7 kg 131.9 kg 132.1 kg   Physical Exam Vitals and nursing note reviewed.  Constitutional:      General: He is not in acute distress.    Appearance: He is obese.     Interventions: He is not intubated. HENT:     Head: Normocephalic and atraumatic.  Cardiovascular:     Rate and Rhythm: Regular rhythm. Tachycardia present.  Pulmonary:     Effort: Tachypnea and accessory muscle usage present. He is not intubated.     Breath sounds: No decreased breath sounds, wheezing, rhonchi or rales.  Abdominal:     Palpations: Abdomen is soft.     Tenderness: There is no abdominal tenderness. There is no guarding.  Musculoskeletal:  Right lower leg: No tenderness. No edema.     Left lower leg: No tenderness. No edema.  Skin:    General: Skin is warm and dry.  Neurological:     General: No focal deficit present.     Mental Status: He is alert and oriented to person, place, and time.  Psychiatric:        Behavior: Behavior normal.    Resolved Hospital Problem list   None   Assessment & Plan:   AGMA: Multifactorial  DKA  Multifactorial including DKA and uremia. DKA has resolved, as BHB is WNL. Gap closed x2 this AM and tolerating diet.  - BMP Daily - Increase Levemir to 23U BID - Novolog 6U TID AC,  SSI R and qHS  - Sodium  bicarb 100 mEq infusion @ 100 cc/hr stopped - Stable for transfer out of ICU, Receiving provider requests pt placed on 100cc/hr of LR, this is reasonable  AKI: Per chart review, lab work from 2 months ago shows WNL creatinine of 0.72 and BUN of 10. No evidence of chronic electrolyte abnormalities.  - Concerning glomerulonephritis versus drug-related AKI. No significant proteinuria.  - Hepatitis B and C negative. - ANA positive, reflex - Complement negative - ANCA still pending  - Daily BMP - Monitor urine output    Urinary retention: Unknown etiology at this time. Obstruction ruled out on renal ultrasound. Concern that foley is being clogged by sediment. - UOP and sediment improving with Foley flushes qShift - Continue foley with frequent flushes   Thrombocytopenia  No past medical history of this. On admission, platelets were 203 and decreased steadily. - Plt stable today at 115  - Daily CBC  Hyperbilirubinemia: Mild 1.5 - Hepatitis B and C negative. - Continue to monitor   Leukocytosis:  Resolved. Pt afebrile. WBC 23.9 >> 8.6.  - Etiology likely from DKA. - 1/4 bcx growing GPR, very likely contaminant - Ucx with no growth.   Hidradenitis suppurative (Hurley Stage 3)  - Monitor for signs of infection   Suspected undiagnosed obstructive sleep apnea:  - Avoid anxiolytics  - Consider outpatient polysomnography at discharge   Best practice:  Diet: CM Pain/Anxiety/Delirium protocol (if indicated): n/a VAP protocol (if indicated): in place DVT prophylaxis: heparin  GI prophylaxis: pantoprazole  Glucose control: [place] insulin  Mobility: BR Code Status: Full Family Communication: Updated.  Disposition: ICU  Labs   CBC: Recent Labs  Lab 01/16/20 0701 01/16/20 1856 01/17/20 0011 01/18/20 0030 01/19/20 0203  WBC 12.7* 13.5* 13.8* 11.5* 8.6  NEUTROABS 10.3*  --  11.3* 8.1* 6.1  HGB 11.8* 10.8* 11.0* 10.3* 10.3*  HCT 32.4* 29.0* 29.6* 27.8* 28.1*  MCV 89.0 86.6 87.1  88.8 89.8  PLT 91* 96* 101* 116* 115*    Basic Metabolic Panel: Recent Labs  Lab 01/15/20 0330 01/15/20 0546 01/15/20 1255 01/15/20 1458 01/16/20 1417 01/16/20 1417 01/17/20 0011 01/17/20 0809 01/17/20 1651 01/18/20 0030 01/18/20 0849 01/18/20 1630 01/19/20 0203  NA 129*   < >  --    < > 131*   < > 127*   < > 136 137 140 136 137  K 2.5*   < >  --    < > 2.9*   < > 3.4*   < > 3.2* 4.0 3.0* 3.1* 3.0*  CL 101   < >  --    < > 104   < > 104   < > 113* 113* 115* 110 110  CO2 10*   < >  --    < >  9*   < > 7*   < > 10* 11* 12* 13* 15*  GLUCOSE 249*   < >  --    < > 227*   < > 247*   < > 221* 179* 186* 201* 214*  BUN 57*   < >  --    < > 64*   < > 61*   < > 57* 56* 54* 50* 44*  CREATININE 2.30*   < >  --    < > 2.41*   < > 2.40*   < > 2.31* 2.21* 2.11* 1.93* 1.84*  CALCIUM 8.5*   < >  --    < > 8.1*   < > 8.0*   < > 8.5* 8.2* 8.0* 7.7* 7.5*  MG 2.2  --   --   --   --   --  2.0  --   --   --   --   --  2.1  PHOS 1.3*  --  1.4*  --  1.1*  --  1.9*  --   --   --   --   --  2.8   < > = values in this interval not displayed.   GFR: Estimated Creatinine Clearance: 67.6 mL/min (A) (by C-G formula based on SCr of 1.84 mg/dL (H)). Recent Labs  Lab 01/14/20 2225 01/14/20 2225 01/15/20 0330 01/16/20 0701 01/16/20 1856 01/17/20 0011 01/18/20 0030 01/19/20 0203  PROCALCITON 47.90  --   --   --   --   --   --   --   WBC 27.5*   < > 23.9*   < > 13.5* 13.8* 11.5* 8.6  LATICACIDVEN 4.2*  --  2.2*  --   --   --   --   --    < > = values in this interval not displayed.    Liver Function Tests: Recent Labs  Lab 01/14/20 1930 01/17/20 1828 01/19/20 0203  AST 29 24 35  ALT 28 23 24   ALKPHOS 185* 127* 134*  BILITOT 3.3* 3.5* 3.7*  PROT 8.9* 7.6 7.8  ALBUMIN 2.5* 1.6* 1.4*   Recent Labs  Lab 01/14/20 1930  LIPASE 40   No results for input(s): AMMONIA in the last 168 hours.  ABG    Component Value Date/Time   PHART 7.308 (L) 01/15/2020 0629   PCO2ART 24.2 (L) 01/15/2020 0629    PO2ART 89.0 01/15/2020 0629   HCO3 12.2 (L) 01/15/2020 0629   TCO2 13 (L) 01/15/2020 0629   ACIDBASEDEF 13.0 (H) 01/15/2020 0629   O2SAT 96.0 01/15/2020 0629     Coagulation Profile: No results for input(s): INR, PROTIME in the last 168 hours.  Cardiac Enzymes: Recent Labs  Lab 01/16/20 1856  CKTOTAL 66    HbA1C: Hgb A1c MFr Bld  Date/Time Value Ref Range Status  01/14/2020 10:25 PM 11.2 (H) 4.8 - 5.6 % Final    Comment:    (NOTE)         Prediabetes: 5.7 - 6.4         Diabetes: >6.4         Glycemic control for adults with diabetes: <7.0   01/26/2016 02:48 PM 5.9 (H) 4.8 - 5.6 % Final    Comment:    (NOTE)         Pre-diabetes: 5.7 - 6.4         Diabetes: >6.4         Glycemic control for adults with diabetes: <  7.0     CBG: Recent Labs  Lab 01/18/20 1255 01/18/20 1435 01/18/20 1600 01/18/20 2215 01/19/20 0804  GLUCAP 176* 170* 172* 224* 205*   Pearson Grippe, DO IM PGY-3 Pager: [phone]   PCCM Attending:   44 yo admitted for DKA, AGMA, ARF  Improving, more alert. Urinary retention issues resolved. Foley with lots of sediment.   BP (!) 141/96   Pulse (!) 107   Temp 97.6 F (36.4 C) (Oral)   Resp (!) 24   Ht 5\' 9"  (1.753 m)   Wt 132.1 kg   SpO2 100%   BMI 43.01 kg/m   Gen: comfortable resting in bed Neuro: alert following commands Heart: sinus on tele, rrr, s1 s2  Lung: no wheeze  Labs reviewed, bicarb better. Scr better   A:  DKA AGMA  Uremia  ARF   P: Remains on bicarb infusion  Keep for now  Observe UOP and renal function  Transitioned to sq insulin  Stable for ICU transfer to [place] 01/19/2020 11:17 AM

## 2020-01-20 ENCOUNTER — Inpatient Hospital Stay (HOSPITAL_COMMUNITY): Payer: BLUE CROSS/BLUE SHIELD

## 2020-01-20 DIAGNOSIS — R338 Other retention of urine: Secondary | ICD-10-CM

## 2020-01-20 LAB — BASIC METABOLIC PANEL
Anion gap: 12 (ref 5–15)
BUN: 36 mg/dL — ABNORMAL HIGH (ref 6–20)
CO2: 16 mmol/L — ABNORMAL LOW (ref 22–32)
Calcium: 7.7 mg/dL — ABNORMAL LOW (ref 8.9–10.3)
Chloride: 111 mmol/L (ref 98–111)
Creatinine, Ser: 1.47 mg/dL — ABNORMAL HIGH (ref 0.61–1.24)
GFR calc Af Amer: >60 mL/min (ref >60)
GFR calc non Af Amer: 56 mL/min — ABNORMAL LOW (ref >60)
Glucose, Bld: 107 mg/dL — ABNORMAL HIGH (ref 70–99)
Potassium: 3.3 mmol/L — ABNORMAL LOW (ref 3.5–5.1)
Sodium: 139 mmol/L (ref 135–145)

## 2020-01-20 LAB — CBC WITH DIFFERENTIAL/PLATELET
Abs Immature Granulocytes: 0.22 10*3/uL — ABNORMAL HIGH (ref 0.00–0.07)
Basophils Absolute: 0.1 10*3/uL (ref 0.0–0.1)
Basophils Relative: 1 %
Eosinophils Absolute: 0.1 10*3/uL (ref 0.0–0.5)
Eosinophils Relative: 1 %
HCT: 26.7 % — ABNORMAL LOW (ref 39.0–52.0)
Hemoglobin: 9.5 g/dL — ABNORMAL LOW (ref 13.0–17.0)
Immature Granulocytes: 2 %
Lymphocytes Relative: 18 %
Lymphs Abs: 2 10*3/uL (ref 0.7–4.0)
MCH: 32.2 pg (ref 26.0–34.0)
MCHC: 35.6 g/dL (ref 30.0–36.0)
MCV: 90.5 fL (ref 80.0–100.0)
Monocytes Absolute: 0.7 10*3/uL (ref 0.1–1.0)
Monocytes Relative: 6 %
Neutro Abs: 8 10*3/uL — ABNORMAL HIGH (ref 1.7–7.7)
Neutrophils Relative %: 72 %
Platelets: 154 10*3/uL (ref 150–400)
RBC: 2.95 MIL/uL — ABNORMAL LOW (ref 4.22–5.81)
RDW: 14.6 % (ref 11.5–15.5)
WBC: 11.1 10*3/uL — ABNORMAL HIGH (ref 4.0–10.5)
nRBC: 0 % (ref 0.0–0.2)

## 2020-01-20 LAB — GLUCOSE, CAPILLARY
Glucose-Capillary: 111 mg/dL — ABNORMAL HIGH (ref 70–99)
Glucose-Capillary: 107 mg/dL — ABNORMAL HIGH (ref 70–99)
Glucose-Capillary: 146 mg/dL — ABNORMAL HIGH (ref 70–99)
Glucose-Capillary: 125 mg/dL — ABNORMAL HIGH (ref 70–99)

## 2020-01-20 MED ORDER — MELATONIN 3 MG PO TABS
3.0000 mg | ORAL_TABLET | Freq: Every day | ORAL | Status: DC
  Administered 2020-01-20 – 2020-01-24 (×5): 3 mg via ORAL
  Filled 2020-01-20 (×5): qty 1

## 2020-01-20 MED ORDER — BLOOD GLUCOSE MONITOR KIT: PACK | 0 refills | Status: AC

## 2020-01-20 MED ORDER — POTASSIUM CHLORIDE CRYS ER 20 MEQ PO TBCR: 20.0000 meq | EXTENDED_RELEASE_TABLET | ORAL | Status: DC

## 2020-01-20 MED ORDER — TAMSULOSIN HCL 0.4 MG PO CAPS
0.4000 mg | ORAL_CAPSULE | Freq: Every day | ORAL | Status: DC
  Administered 2020-01-20 – 2020-01-24 (×5): 0.4 mg via ORAL
  Filled 2020-01-20 (×5): qty 1

## 2020-01-20 MED ORDER — "PEN NEEDLES 3/16"" 31G X 5 MM MISC": 0 refills | Status: AC

## 2020-01-20 MED ORDER — OXYCODONE HCL 5 MG PO TABS
5.0000 mg | ORAL_TABLET | Freq: Four times a day (QID) | ORAL | Status: DC | PRN
  Administered 2020-01-20 – 2020-01-25 (×11): 5 mg via ORAL
  Filled 2020-01-20 (×13): qty 1

## 2020-01-20 MED ORDER — TAMSULOSIN HCL 0.4 MG PO CAPS: 0.4000 mg | ORAL_CAPSULE | Freq: Every day | ORAL | Status: DC

## 2020-01-20 MED ORDER — LEVEMIR FLEXTOUCH 100 UNIT/ML ~~LOC~~ SOPN: 20.0000 [IU] | PEN_INJECTOR | Freq: Two times a day (BID) | SUBCUTANEOUS | 0 refills | Status: DC

## 2020-01-20 MED ORDER — INSULIN ASPART 100 UNIT/ML ~~LOC~~ SOLN
0.0000 [IU] | Freq: Three times a day (TID) | SUBCUTANEOUS | Status: DC
  Administered 2020-01-20 – 2020-01-24 (×4): 2 [IU] via SUBCUTANEOUS

## 2020-01-20 MED ORDER — INSULIN ASPART 100 UNIT/ML ~~LOC~~ SOLN: 0.0000 [IU] | Freq: Every day | SUBCUTANEOUS | Status: DC

## 2020-01-20 MED ORDER — TAMSULOSIN HCL 0.4 MG PO CAPS: 0.4000 mg | ORAL_CAPSULE | Freq: Every day | ORAL | 0 refills | Status: AC

## 2020-01-20 MED ORDER — INSULIN DETEMIR 100 UNIT/ML ~~LOC~~ SOLN
20.0000 [IU] | Freq: Two times a day (BID) | SUBCUTANEOUS | Status: DC
  Administered 2020-01-20: 21:00:00 20 [IU] via SUBCUTANEOUS
  Filled 2020-01-20 (×3): qty 0.2

## 2020-01-20 MED ORDER — POTASSIUM CHLORIDE 20 MEQ/15ML (10%) PO SOLN
20.0000 meq | ORAL | Status: AC
  Administered 2020-01-20 (×2): 20 meq via ORAL
  Filled 2020-01-20 (×2): qty 15

## 2020-01-20 MED ORDER — NOVOLOG FLEXPEN 100 UNIT/ML ~~LOC~~ SOPN: 6.0000 [IU] | PEN_INJECTOR | Freq: Three times a day (TID) | SUBCUTANEOUS | 0 refills | Status: DC

## 2020-01-20 NOTE — Evaluation (Signed)
Occupational Therapy Evaluation Patient Details Name: Glenn Murphy MRN: [id number] DOB: 1976/03/07 Today's Date: 01/20/2020    History of Present Illness Patient is a 44 year old male with lifelong history of hidradenitis brought to the emergency room with hyperglycemia, elevated BHB and anion gap metabolic acidosis.   Clinical Impression   This 44 y/o male presents with the above. PTA pt living with spouse, reports use of cane for mobility tasks and receiving assist from spouse for ADL, reports increased difficulty with mobility as of recent. Pt requiring encouragement to participate this session, pt agreeable only if therapy "brings him a wheelchair" - reports he'll get OOB to chair. Pt currently requiring overall modA for functional transfers using cane (+2 for safety); requiring up to totalA for ADL tasks, partly due to pt with limited engagement/attempts to complete on his own. Pt's spouse present during session and endorsing she will be home during the daytime to assist pt at time of discharge. Pt will benefit from continued acute OT services and currently recommend follow up [facility] services (pending progress) after discharge to maximize his safety and independence with ADL and mobility. Will follow.     Follow Up Recommendations  Home health OT;Supervision/Assistance - 24 hour(likely to refuse anything but home)    Equipment Recommendations  3 in 1 bedside commode           Precautions / Restrictions Precautions Precautions: Fall Restrictions Weight Bearing Restrictions: No      Mobility Bed Mobility Overal bed mobility: Needs Assistance Bed Mobility: Supine to Sit     Supine to sit: Mod assist     General bed mobility comments: assist for LEs over EOB, cues for pt to self initiate trunk elevation  Transfers Overall transfer level: Needs assistance Equipment used: 2 person hand held assist;Quad cane Transfers: Sit to/from Stand Sit to Stand: +2  safety/equipment;Mod assist         General transfer comment: boosting assist from EOB and steadying initially     Balance Overall balance assessment: Needs assistance Sitting-balance support: Feet supported Sitting balance-Leahy Scale: Fair       Standing balance-Leahy Scale: Poor Standing balance comment: reliant on at least single UE support                           ADL either performed or assessed with clinical judgement   ADL Overall ADL's : Needs assistance/impaired Eating/Feeding: Set up;Sitting   Grooming: Set up;Supervision/safety;Sitting   Upper Body Bathing: Min guard;Set up;Sitting   Lower Body Bathing: Moderate assistance;+2 for safety/equipment;Sit to/from stand   Upper Body Dressing : Minimal assistance;Sitting   Lower Body Dressing: Total assistance;Sit to/from stand Lower Body Dressing Details (indicate cue type and reason): pt would not attempt to don socks, required assist  Toilet Transfer: Moderate assistance;+2 for safety/equipment;Stand-pivot Toilet Transfer Details (indicate cue type and reason): simulated via transfer to w/c; use of small base cane Toileting- Clothing Manipulation and Hygiene: Maximal assistance;Sit to/from stand;Sitting/lateral lean;+2 for safety/equipment       Functional mobility during ADLs: Moderate assistance;+2 for safety/equipment;Cane                           Pertinent Vitals/Pain Pain Assessment: Faces Faces Pain Scale: Hurts little more Pain Location: bil knees (L>R) Pain Descriptors / Indicators: Aching Pain Intervention(s): Limited activity within patient's tolerance;Monitored during session     Hand Dominance     Extremity/Trunk Assessment Upper  Extremity Assessment Upper Extremity Assessment: Generalized weakness   Lower Extremity Assessment Lower Extremity Assessment: Defer to PT evaluation       Communication Communication Communication: No difficulties   Cognition  Arousal/Alertness: Awake/alert Behavior During Therapy: Flat affect Overall Cognitive Status: Difficult to assess                                 General Comments: pt appears to be [facility] for basic tasks, not very forthcoming with much information, requires encouragement to participate; decreased insight into need to stay in hospital   General Comments  HR up to low 120s with activity    Exercises Exercises: Other exercises Other Exercises Other Exercises: general AAROM to bil LEs start of session   Shoulder Instructions      Home Living Family/patient expects to be discharged to:: Private residence Living Arrangements: Spouse/significant other Available Help at Discharge: Family;Available PRN/intermittently(wife works) Type of Home: House Home Access: Stairs to enter [employer] of Steps: 2-3 Entrance Stairs-Rails: None;Left(no rails back door; rail L front door) Home Layout: One level     Bathroom Shower/Tub: Teacher, early years/pre: Handicapped height     Home Equipment: Cane - quad(small based; reports plans to get shower chair)          Prior Functioning/Environment Level of Independence: Independent;Independent with assistive device(s);Needs assistance    ADL's / Homemaking Assistance Needed: appears spouse has been assisting with some ADL    Comments: 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; L knee worse than R, wears knee brace L typically        OT Problem List: Decreased strength;Decreased range of motion;Decreased activity tolerance;Impaired balance (sitting and/or standing);Decreased knowledge of use of DME or AE;Obesity;Cardiopulmonary status limiting activity;Decreased cognition;Decreased safety awareness;Pain      OT Treatment/Interventions: Self-care/ADL training;Therapeutic exercise;Energy conservation;DME and/or AE instruction;Therapeutic activities;Patient/family education;Balance training    OT Goals(Current goals can  be found in the care plan section) Acute Rehab OT Goals Patient Stated Goal: home "today" OT Goal Formulation: With patient Time For Goal Achievement: 02/03/20 Potential to Achieve Goals: Good  OT Frequency: Min 2X/week   Barriers to D/C:            Co-evaluation PT/OT/SLP Co-Evaluation/Treatment: Yes Reason for Co-Treatment: Necessary to address cognition/behavior during functional activity;For patient/therapist safety;To address functional/ADL transfers   OT goals addressed during session: ADL's and self-care      AM-PAC OT "6 Clicks" Daily Activity     Outcome Measure Help from another person eating meals?: A Little Help from another person taking care of personal grooming?: A Little Help from another person toileting, which includes using toliet, bedpan, or urinal?: A Lot Help from another person bathing (including washing, rinsing, drying)?: A Lot Help from another person to put on and taking off regular upper body clothing?: A Little Help from another person to put on and taking off regular lower body clothing?: Total 6 Click Score: 14   End of Session Equipment Utilized During Treatment: Other (comment)(cane) Nurse Communication: Mobility status  Activity Tolerance: Patient tolerated treatment well Patient left: in chair;with call bell/phone within reach;with family/visitor present  OT Visit Diagnosis: Other abnormalities of gait and mobility (R26.89);Muscle weakness (generalized) (M62.81);Pain Pain - Right/Left: (bil) Pain - part of body: Knee                Time: 9371-6967 OT Time Calculation (min): 35 min Charges:  OT  General Charges $OT Visit: 1 Visit OT Evaluation $OT Eval Moderate Complexity: 1 Mod  Marcy Siren, OT Acute Rehabilitation Services Pager [phone] Office [phone]   Orlando Penner 01/20/2020, 5:12 PM

## 2020-01-20 NOTE — Progress Notes (Signed)
Results for Glenn Murphy, Glenn Murphy (MRN [id number]) as of 01/20/2020 12:33  Ref. Range 01/20/2020 07:50 01/20/2020 11:09  Glucose-Capillary Latest Ref Range: 70 - 99 mg/dL 111 (H) 107 (H)    Admit with:DKA  History:Pre-Diabetes (per wife),Hidradenitis suppurativa  Home DM Meds:Metformin 1000 mg Daily (started February)  Current Orders: Levemir 23 units BID      Novolog Resistant Correction Scale/ SSI (0-20 units) TID AC + HS      Novolog 6 units TID with meals    CBGs much improved on current Insulin regimen.  Visited with pt and wife again today at bedside.  Pt was very sleepy and could not keep his eyes open during our conversation (pt's wife told me pt not sleeping well at night in the ICU with all the noise--sleeping during the daytime hours in the hospital).  Conversation about diabetes mainly focused on with pt's wife.  Discussed A1C results with them and explained what an A1C is, basic pathophysiology of DM Type 2, basic home care, basic diabetes diet nutrition principles, importance of checking CBGs and maintaining good CBG control to prevent long-term and short-term complications.  Also reviewed blood sugar goals and A1c goals for home.    RNs to provide ongoing basic DM education at bedside with this patient.  Have ordered educational booklet, insulin starter kit.  Have also placed RD consult for DM diet education for this patient--Wife told me the RD came by and gave her diabetes diet info--Per wife, both she and pt cook in the home.  Pt's wife did not have questions for me at this time.  Discussed with pt's wife that we currently have pt on 2 different types of insulin.  Explained what Levemir and Novolog insulins are, how they work, Social research officer, government.  Discussed w/ wife that there are 2 methods of giving insulin (vial and syringe versus insulin pen).  Educated patient's spouse on insulin pen use at home.  Reviewed all steps of insulin pen including attachment of needle, 2-unit air shot,  dialing up dose, giving injection, rotation of injection sites, removing needle, disposal of sharps, storage of unused insulin, disposal of insulin etc.  Patient's wife able to provide successful return demonstration.  Reviewed troubleshooting with insulin pen.  Also reviewed Signs/Symptoms of Hypoglycemia with patient and how to treat Hypoglycemia at home.  Have asked RNs caring for patient to please allow patient to give all injections here in hospital as much as possible for practice.  MD to give patient Rxs for insulin pens and insulin pen needles.     --Will follow patient during hospitalization--  Wyn Quaker RN, MSN, CDE Diabetes Coordinator Inpatient Glycemic Control Team Team Pager: [phone] (8a-5p)

## 2020-01-20 NOTE — Progress Notes (Signed)
Removed foley catheter per protocol at 0800. Pt was trying to get up to urinate at 1000. Was able to pee a little but not much. Scanned bladder and recorded >980 ml. In & out catheter performed and only 225 ml of brown urine obtained. Met some resistance. Re-scanned bladder post in and out cath. Bladder scan says >687 ml in bladder. NP Merlene Laughter notified.   This may be a hindrance to discharging patient.   Dewaine Oats, RN

## 2020-01-20 NOTE — Consult Note (Signed)
WOC Nurse Consult Note: Reason for Consult: Hidradenitis lesions to bilateral upper back.   Wound type:inflammatory/autoimmune Pressure Injury POA: NA Measurement:2 areas 2 cm round each. Wound IXB:[id number] and ruddy red Drainage (amount, consistency, odor) moderate purulence no odor.  Periwound: erythema and tenderness Dressing procedure/placement/frequency: Cleanse wounds to back with NS and pat dry. Apply Aquacel to wound bed.  COver with foam. Change every three days and PRN soilage.  Will not follow at this time.  Please re-consult if needed.  Maple Hudson MSN, RN, FNP-BC CWON Wound, Ostomy, Continence Nurse Pager [phone]

## 2020-01-20 NOTE — Progress Notes (Signed)
PROGRESS NOTE    Glenn Murphy  UDJ:[id number] DOB: [birth year] DOA: 01/14/2020 PCP: Rogers Blocker, MD   Brief Narrative: 44 year old male with lifelong history of hidradenitis, diabetes mellitus, hypertension who in February was placed on Accutane and prednisone, prior to that has been on continuous antibiotics most recently doxycycline for about a year.On an evaluation at [facility] on 3/26 he was having blurry vision felt secondary to Accutane with little clinical improvement along with loss of appetite. The Accutane was stopped on that visit. Notes on that visit indicate he was started on Humira but it is not clear as to whether he has been taking it or not. He was also started on clindamycin gel. He also was started on Metformin in February. Presented to the ED with complaint of thirst, hurting all over lab results showed hyponatremia metabolic acidosis, anion gap BS to be more than 8 blood sugar 734 creatinine 2.0 found to have AKI/DKA, anion gap metabolic acidosis and was admitted to ICU on the bicarb drip. Chest x-ray 4/6 clear lungs without pleural effusion, renal ultrasound 4/7 normal renal ultrasound no hydronephrosis.  Initially on vancomycin/Zosyn subsequently discontinued and briefly received ceftriaxone till 4/9.  Blood culture negative, 1 set of blood culture grows Actinomyces Naeslundii, urine cx negative. Patient has been since weaned off insulin drip switched to long-acting and premeal insulin, off antibiotics and transferred to [facility] service 01/20/2020  Subjective:  Seen and examined.  Wife at the bedside Patient has no new complaints. Creatinine improving blood sugar stable, Foley removed this morning-bladder scan showed more than 90 mL in and out cath yielded 225 mL brown urine met some resistance, rescan bladder post urine out cath and had more than 687 mL   Assessment & Plan:  DKA/anion gap metabolic acidosis/type 2 diabetes mellitus poorly  controlled A1c at 11.2.DKA resolved. Off iv insulin.Blood sugar fairly stable on Levemir 23 units twice daily, aspart 6 units 3 times daily premeal and sliding scale.  Received exra 7 units from sliding scale.  Consult diabetic coordinator he will need insulin hypoglycemia diabetic teaching, discussed with nursing staff. Recent Labs  Lab 01/19/20 1136 01/19/20 1537 01/19/20 2151 01/20/20 0750 01/20/20 1109  GLUCAP 179* 150* 113* 111* 107*   Urine retention: urine retention despite and out cath with Foley removal this morning.  We will obtain urology eval discussed with Dr. Claudia Desanctis advised to put the Foley back in and follow-up will be arranged by her in a week in her office to do voiding trial.  Nursing reports Foley catheter changed x3 and issues with the drainage- will see if he needs flush again and there is issue with the drainage after Foley was placed back and will discuss with urology.  Will check renal ultrasound.  Ordered Flomax.  Bood culture grew Actinomyces Naeslundii in 1 set-possible true infection rather than contamination given patient's hidradenitis suppurativa, he was on antibiotics previously for prolonged. I consulted and discussed with infectious disease. His antibiotics were discontinued previously in the ICU.  Chronic hidradenitis, wound care on consult and hidradenitis lesions to bilateral upper back, continue wound care  Morbid obesity with body mass index is 43.01 kg/m: Will benefit with weight loss and healthy lifestyle.  DVT prophylaxis:Heparin Code Status: full  Family Communication: plan of care discussed with patient at bedside. Status is: Inpatient  Remains inpatient appropriate because:Urine retention after Foley removal, ID evaluation.  Dispo: The patient is from: Home  Anticipated d/c is to: Home              Anticipated d/c date is: 1 day              Patient currently is not medically stable to d/c. Nutrition: Diet Order            Diet  Carb Modified Fluid consistency: Thin; Room service appropriate? Yes  Diet effective now            Consultants: PCCM, Urology Procedures:see note Microbiology:see note  Medications: Scheduled Meds: . Chlorhexidine Gluconate Cloth  6 each Topical Daily  . heparin  5,000 Units Subcutaneous Q8H  . insulin aspart  0-20 Units Subcutaneous TID WC  . insulin aspart  0-5 Units Subcutaneous QHS  . insulin aspart  6 Units Subcutaneous TID WC  . insulin detemir  23 Units Subcutaneous BID  . insulin starter kit- pen needles  1 kit Other Once  . mouth rinse  15 mL Mouth Rinse BID  . sodium chloride flush  3 mL Intravenous Once   Continuous Infusions:  Antimicrobials: Anti-infectives (From admission, onward)   Start     Dose/Rate Route Frequency Ordered Stop   01/15/20 2200  vancomycin (VANCOREADY) IVPB 1500 mg/300 mL  Status:  Discontinued     1,500 mg 150 mL/hr over 120 Minutes Intravenous Every 24 hours 01/14/20 2154 01/15/20 1019   01/15/20 1500  cefTRIAXone (ROCEPHIN) 2 g in sodium chloride 0.9 % 100 mL IVPB  Status:  Discontinued     2 g 200 mL/hr over 30 Minutes Intravenous Every 24 hours 01/15/20 1019 01/17/20 1114   01/15/20 0400  piperacillin-tazobactam (ZOSYN) IVPB 3.375 g  Status:  Discontinued     3.375 g 12.5 mL/hr over 240 Minutes Intravenous Every 8 hours 01/14/20 2154 01/15/20 1019   01/14/20 2200  piperacillin-tazobactam (ZOSYN) IVPB 3.375 g     3.375 g 100 mL/hr over 30 Minutes Intravenous  Once 01/14/20 2154 01/14/20 2339   01/14/20 2200  vancomycin (VANCOCIN) 2,500 mg in sodium chloride 0.9 % 500 mL IVPB     2,500 mg 250 mL/hr over 120 Minutes Intravenous  Once 01/14/20 2154 01/15/20 0402       Objective: Vitals: Today's Vitals   01/20/20 0800 01/20/20 0810 01/20/20 0900 01/20/20 1110  BP:  140/81 (!) 145/79   Pulse:  (!) 107 (!) 107   Resp: (!) 21 (!) 28 (!) 26   Temp:    98.1 F (36.7 C)  TempSrc:    Axillary  SpO2:  98% 100%   Weight:      Height:       PainSc:        Intake/Output Summary (Last 24 hours) at 01/20/2020 1323 Last data filed at 01/20/2020 1002 Gross per 24 hour  Intake 1028.55 ml  Output 1640 ml  Net -611.45 ml   Filed Weights   01/16/20 0241 01/17/20 0451 01/18/20 0453  Weight: 128.7 kg 131.9 kg 132.1 kg   Weight change:    Intake/Output from previous day: 04/11 0701 - 04/12 0700 In: 1028.6 [I.V.:1028.6] Out: 2250 [Urine:2250] Intake/Output this shift: Total I/O In: -  Out: 390 [Urine:390]  Examination:  General exam: AAO ,NAD, weak appearing. HEENT:Oral mucosa moist, Ear/Nose WNL grossly,dentition normal. Respiratory system: bilaterally clear,no wheezing or crackles,no use of accessory muscle, non tender. Cardiovascular system:S1 & S2 +, regular, No JVD. Gastrointestinal system:Abdomen soft, obese,NT,ND, BS+. Nervous System:Alert, awake, moving extremities and grossly nonfocal Extremities: No edema, distal peripheral  pulses palpable.  Skin:No rashes,no icterus. IEP:PIRJJO muscle bulk,tone, power Skin changes chronic- see wound care note above.  Data Reviewed: I have personally reviewed following labs and imaging studies CBC: Recent Labs  Lab 01/16/20 0701 01/16/20 0701 01/16/20 1856 01/17/20 0011 01/18/20 0030 01/19/20 0203 01/20/20 0412  WBC 12.7*   < > 13.5* 13.8* 11.5* 8.6 11.1*  NEUTROABS 10.3*  --   --  11.3* 8.1* 6.1 8.0*  HGB 11.8*   < > 10.8* 11.0* 10.3* 10.3* 9.5*  HCT 32.4*   < > 29.0* 29.6* 27.8* 28.1* 26.7*  MCV 89.0   < > 86.6 87.1 88.8 89.8 90.5  PLT 91*   < > 96* 101* 116* 115* 154   < > = values in this interval not displayed.   Basic Metabolic Panel: Recent Labs  Lab 01/15/20 0330 01/15/20 0546 01/15/20 1255 01/15/20 1458 01/16/20 1417 01/16/20 1417 01/17/20 0011 01/17/20 0809 01/18/20 0849 01/18/20 0849 01/18/20 1630 01/19/20 0203 01/19/20 0911 01/19/20 1633 01/20/20 0412  NA 129*   < >  --    < > 131*   < > 127*   < > 140  --  136 137 137  --  139  K  2.5*   < >  --    < > 2.9*   < > 3.4*   < > 3.0*   < > 3.1* 3.0* 3.7 3.4* 3.3*  CL 101   < >  --    < > 104   < > 104   < > 115*  --  110 110 110  --  111  CO2 10*   < >  --    < > 9*   < > 7*   < > 12*  --  13* 15* 14*  --  16*  GLUCOSE 249*   < >  --    < > 227*   < > 247*   < > 186*  --  201* 214* 225*  --  107*  BUN 57*   < >  --    < > 64*   < > 61*   < > 54*  --  50* 44* 42*  --  36*  CREATININE 2.30*   < >  --    < > 2.41*   < > 2.40*   < > 2.11*  --  1.93* 1.84* 1.70*  --  1.47*  CALCIUM 8.5*   < >  --    < > 8.1*   < > 8.0*   < > 8.0*  --  7.7* 7.5* 7.3*  --  7.7*  MG 2.2  --   --   --   --   --  2.0  --   --   --   --  2.1  --   --   --   PHOS 1.3*  --  1.4*  --  1.1*  --  1.9*  --   --   --   --  2.8  --   --   --    < > = values in this interval not displayed.   GFR: Estimated Creatinine Clearance: 84.6 mL/min (A) (by C-G formula based on SCr of 1.47 mg/dL (H)). Liver Function Tests: Recent Labs  Lab 01/14/20 1930 01/17/20 1828 01/19/20 0203  AST 29 24 35  ALT _0 ALKPHOS 185* 127* 134*  BILITOT 3.3* 3.5* 3.7*  PROT 8.9* 7.6 7.8  ALBUMIN  2.5* 1.6* 1.4*   Recent Labs  Lab 01/14/20 1930  LIPASE 40   No results for input(s): AMMONIA in the last 168 hours. Coagulation Profile: No results for input(s): INR, PROTIME in the last 168 hours. Cardiac Enzymes: Recent Labs  Lab 01/16/20 1856  CKTOTAL 66   BNP (last 3 results) No results for input(s): PROBNP in the last 8760 hours. HbA1C: No results for input(s): HGBA1C in the last 72 hours. CBG: Recent Labs  Lab 01/19/20 1136 01/19/20 1537 01/19/20 2151 01/20/20 0750 01/20/20 1109  GLUCAP 179* 150* 113* 111* 107*   Lipid Profile: No results for input(s): CHOL, HDL, LDLCALC, TRIG, CHOLHDL, LDLDIRECT in the last 72 hours. Thyroid Function Tests: No results for input(s): TSH, T4TOTAL, FREET4, T3FREE, THYROIDAB in the last 72 hours. Anemia Panel: No results for input(s): VITAMINB12, FOLATE, FERRITIN, TIBC,  IRON, RETICCTPCT in the last 72 hours. Sepsis Labs: Recent Labs  Lab 01/14/20 2225 01/15/20 0330  PROCALCITON 47.90  --   LATICACIDVEN 4.2* 2.2*    Recent Results (from the past 240 hour(s))  Respiratory Panel by RT PCR (Flu A&B, Covid) - Nasopharyngeal Swab     Status: None   Collection Time: 01/14/20  8:22 PM   Specimen: Nasopharyngeal Swab  Result Value Ref Range Status   SARS Coronavirus 2 by RT PCR NEGATIVE NEGATIVE Final    Comment: (NOTE) SARS-CoV-2 target nucleic acids are NOT DETECTED. The SARS-CoV-2 RNA is generally detectable in upper respiratoy specimens during the acute phase of infection. The lowest concentration of SARS-CoV-2 viral copies this assay can detect is 131 copies/mL. A negative result does not preclude SARS-Cov-2 infection and should not be used as the sole basis for treatment or other patient management decisions. A negative result may occur with  improper specimen collection/handling, submission of specimen other than nasopharyngeal swab, presence of viral mutation(s) within the areas targeted by this assay, and inadequate number of viral copies (<131 copies/mL). A negative result must be combined with clinical observations, patient history, and epidemiological information. The expected result is Negative. Fact Sheet for Patients:  PinkCheek.be Fact Sheet for Healthcare Providers:  GravelBags.it This test is not yet ap proved or cleared by the [place] FDA and  has been authorized for detection and/or diagnosis of SARS-CoV-2 by FDA under an Emergency Use Authorization (EUA). This EUA will remain  in effect (meaning this test can be used) for the duration of the COVID-19 declaration under Section 564(b)(1) of the Act, 21 U.S.C. section 360bbb-3(b)(1), unless the authorization is terminated or revoked sooner.    Influenza A by PCR NEGATIVE NEGATIVE Final   Influenza B by PCR NEGATIVE  NEGATIVE Final    Comment: (NOTE) The Xpert Xpress SARS-CoV-2/FLU/RSV assay is intended as an aid in  the diagnosis of influenza from Nasopharyngeal swab specimens and  should not be used as a sole basis for treatment. Nasal washings and  aspirates are unacceptable for Xpert Xpress SARS-CoV-2/FLU/RSV  testing. Fact Sheet for Patients: PinkCheek.be Fact Sheet for Healthcare Providers: GravelBags.it This test is not yet approved or cleared by the [place] FDA and  has been authorized for detection and/or diagnosis of SARS-CoV-2 by  FDA under an Emergency Use Authorization (EUA). This EUA will remain  in effect (meaning this test can be used) for the duration of the  Covid-19 declaration under Section 564(b)(1) of the Act, 21  U.S.C. section 360bbb-3(b)(1), unless the authorization is  terminated or revoked. Performed at [facility], [place] [address],  [place], [place] [address]   Culture, blood (routine x 2)     Status: Abnormal   Collection Time: 01/14/20  9:57 PM   Specimen: BLOOD LEFT HAND  Result Value Ref Range Status   Specimen Description BLOOD LEFT HAND  Final   Special Requests   Final    BOTTLES DRAWN AEROBIC AND ANAEROBIC Blood Culture results may not be optimal due to an inadequate volume of blood received in culture bottles   Culture  Setup Time   Final    GRAM POSITIVE RODS ANAEROBIC BOTTLE ONLY CRITICAL RESULT CALLED TO, READ BACK BY AND VERIFIED WITH: G. ABBOTT,PHARMD [id number] 01/17/2020 T. TYSOR    Culture (A)  Final    ACTINOMYCES NAESLUNDII Standardized susceptibility testing for this organism is not available. Performed at [facility], [place] [address], [place], [place] [address]    Report Status 01/19/2020 FINAL  Final  Culture, blood (routine x 2)     Status: None   Collection Time: 01/14/20 10:05 PM   Specimen: BLOOD  Result Value Ref Range Status   Specimen Description BLOOD LEFT  ANTECUBITAL  Final   Special Requests   Final    BOTTLES DRAWN AEROBIC ONLY Blood Culture results may not be optimal due to an inadequate volume of blood received in culture bottles   Culture   Final    NO GROWTH 5 DAYS Performed at [facility], [place] [address] [place], [place] [address]    Report Status 01/19/2020 FINAL  Final  MRSA PCR Screening     Status: None   Collection Time: 01/15/20  1:05 AM   Specimen: Nasal Mucosa; Nasopharyngeal  Result Value Ref Range Status   MRSA by PCR NEGATIVE NEGATIVE Final    Comment:        The GeneXpert MRSA Assay (FDA approved for NASAL specimens only), is one component of a comprehensive MRSA colonization surveillance program. It is not intended to diagnose MRSA infection nor to guide or monitor treatment for MRSA infections. Performed at [facility], [place] [address] [place], [place] [address]   Urine culture     Status: None   Collection Time: 01/15/20  2:19 AM   Specimen: Urine, Random  Result Value Ref Range Status   Specimen Description URINE, RANDOM  Final   Special Requests NONE  Final   Culture   Final    NO GROWTH Performed at [facility], [place] [address], [place], [place] [address]    Report Status 01/16/2020 FINAL  Final      Radiology Studies: No results found.   LOS: 6 days   Time spent: More than 50% of that time was spent in counseling and/or coordination of care.  Antonieta Pert, MD [facility]  01/20/2020, 1:23 PM

## 2020-01-20 NOTE — Progress Notes (Signed)
[facility] ADULT ICU REPLACEMENT PROTOCOL FOR AM LAB REPLACEMENT ONLY  The patient does apply for the [facility] Adult ICU Electrolyte Replacment Protocol based on the criteria listed below:   1. Is GFR >/= 40 ml/min? Yes.    Patient's GFR today is >60 2. Is urine output >/= 0.5 ml/kg/hr for the last 6 hours? Yes.   Patient's UOP is 0.6 ml/kg/hr 3. Is BUN < 60 mg/dL? Yes.    Patient's BUN today is 36 4. Abnormal electrolyte(s): k 3.3 5. Ordered repletion with: protocol per mouth 6. If a panic level lab has been reported, has the CCM MD in charge been notified? No..   Physician:    Markus Daft A 01/20/2020 6:26 AM

## 2020-01-20 NOTE — Discharge Instructions (Signed)
Insulin Pen Instructions:  1. Remove Insulin pen cap and clean pen 1st with alcohol rub and then clean skin 2nd with alcohol rub 2. Twist insulin pen needle onto pen (right tighty) 3. Remove outer cap and inner cap from needle 4. Dial pen to 2 units and perform prime- press pen to zero and make sure liquid (insulin) comes out of the needle 5. Dial pen to your dose and perform injection into your abdomen 6. Hold needle in skin for 10 seconds after injection 7. Remove needle from insulin pen and discard 8. Place cap back on insulin pen and store safely (at room temperature) 9. Store unused pens in refrigerator and can keep opened insulin pen at room temperature (discard used pen after 30 days)  Symptoms of Hypoglycemia: Silly, Sweaty, Shaky Check sugar if you have your meter.  If near or less than 70 mg/dl, treat with 1/2 cup juice or soda or take glucose tablets Check sugar 15 minutes after treatment.  If sugar still near or less than 70 mg/al and symptomatic, treat again and may need a snack with some protein (peanut butter with crackers, etc)

## 2020-01-20 NOTE — Consult Note (Addendum)
I have been asked to see the patient by Dr. Lanae Boast, for evaluation and management of urinary retention and non-draining foley  History of present illness:44 yo man with hx of DM, hidradenitis, and HTN who was admitted with DKA.  Urology consulted because foley it intermittently obstructed and patient is leaking around catheter.  Patient had gross hematuria when he initially presented to ER.   Review of systems: A 12 point comprehensive review of systems was obtained and is negative unless otherwise stated in the history of present illness.  Patient Active Problem List   Diagnosis Date Noted  . DKA (diabetic ketoacidoses) (HCC) 01/15/2020  . Abscess 05/26/2014    No current facility-administered medications on file prior to encounter.   Current Outpatient Medications on File Prior to Encounter  Medication Sig Dispense Refill  . allopurinol (ZYLOPRIM) 300 MG tablet Take 300 mg by mouth daily.  3  . busPIRone (BUSPAR) 10 MG tablet Take 10 mg by mouth 3 (three) times daily.    . clindamycin (CLINDAGEL) 1 % gel Apply 1 application topically See admin instructions. Apply to bumps on face and body 1-2 times daily as needed.    . ferrous sulfate 325 (65 FE) MG tablet Take 325 mg by mouth daily.    [name] gabapentin (NEURONTIN) 600 MG tablet Take 600 mg by mouth 3 (three) times daily.    . IBU 800 MG tablet Take 800 mg by mouth every 8 (eight) hours.    . Magnesium 200 MG TABS Take 200 mg by mouth daily.    . metoprolol succinate (TOPROL-XL) 25 MG 24 hr tablet Take 25 mg by mouth daily.  4  . Milk Thistle Extract 175 MG TABS Take 175 mg by mouth daily.    . Omega-3 1000 MG CAPS Take 1 capsule by mouth daily.    [name] triamcinolone cream (KENALOG) 0.1 % Apply 1 application topically 2 (two) times daily as needed.   3  . triamterene-hydrochlorothiazide (MAXZIDE-25) 37.5-25 MG tablet Take 1 tablet by mouth daily.  3  . Vitamin D, Ergocalciferol, (DRISDOL) 1.25 MG (50000 UNIT) CAPS capsule Take 50,000  Units by mouth once a week.      Past Medical History:  Diagnosis Date  . Abscess    recurrent infections  . Arthritis   . Diabetes mellitus without complication (HCC)   . Hypertension   . Obesity     Past Surgical History:  Procedure Laterality Date  . CYST EXCISION N/A 01/28/2016   Procedure: EXCISION SEBACEOUS CYST FOREHEAD X 2;  Surgeon: [name], MD;  Location: [facility] OR;  Service: General;  Laterality: N/A;  . CYST REMOVAL TRUNK Left 01/28/2016   Procedure: EXCISION SEBACEOUS CYST LEFT SHOULDER AND MID-BACK;  Surgeon: [name], MD;  Location: [facility] OR;  Service: General;  Laterality: Left;  . HYDRADENITIS EXCISION Right 01/28/2016   Procedure: EXCISION HIDRADENITIS RIGHT AXILLA ;  Surgeon: [name], MD;  Location: [facility] OR;  Service: General;  Laterality: Right;  . HYDRADENITIS EXCISION N/A 01/28/2016   Procedure: EXCISION HIDRADENITIS POSTERIOR NECK ;  Surgeon: [name], MD;  Location: [facility] OR;  Service: General;  Laterality: N/A;    Social History   Tobacco Use  . Smoking status: Current Every Day Smoker    Packs/day: 0.50    Types: Cigarettes  Substance Use Topics  . Alcohol use: Yes    Comment: 40oz beer a day  fifth of liquor over  q 3-4 days  . Drug use: Yes  Frequency: 2.0 times per week    Types: Marijuana    Comment: 01/26/16    Family History  Problem Relation Age of Onset  . Cancer Father     PE: Vitals:   01/20/20 1508 01/20/20 1600 01/20/20 1900 01/20/20 2052  BP:  130/85  102/80  Pulse:    (!) 110  Resp:  (!) 24 18 17   Temp: 97.7 F (36.5 C)   (!) 97.5 F (36.4 C)  TempSrc: Axillary   Oral  SpO2:    100%  Weight:      Height:       Patient appears to be in no acute distress  patient is alert and oriented x3 Atraumatic normocephalic head No cervical or supraclavicular lymphadenopathy appreciated No increased work of breathing Regular sinus rhythm/rate Abdomen is soft, nontender, nondistended, no CVA or suprapubic tenderness GU:  circ phallus with redundant foreskin, 16Fr foley draining tea colored urine Lower extremities are symmetric Grossly neurologically intact   Recent Labs    01/18/20 0030 01/19/20 0203 01/20/20 0412  WBC 11.5* 8.6 11.1*  HGB 10.3* 10.3* 9.5*  HCT 27.8* 28.1* 26.7*   Recent Labs    01/19/20 0203 01/19/20 0203 01/19/20 0911 01/19/20 1633 01/20/20 0412  NA 137  --  137  --  139  K 3.0*   < > 3.7 3.4* 3.3*  CL 110  --  110  --  111  CO2 15*  --  14*  --  16*  GLUCOSE 214*  --  225*  --  107*  BUN 44*  --  42*  --  36*  CREATININE 1.84*  --  1.70*  --  1.47*  CALCIUM 7.5*  --  7.3*  --  7.7*   < > = values in this interval not displayed.   No results for input(s): LABPT, INR in the last 72 hours. No results for input(s): LABURIN in the last 72 hours. Results for orders placed or performed during the hospital encounter of 01/14/20  Respiratory Panel by RT PCR (Flu A&B, Covid) - Nasopharyngeal Swab     Status: None   Collection Time: 01/14/20  8:22 PM   Specimen: Nasopharyngeal Swab  Result Value Ref Range Status   SARS Coronavirus 2 by RT PCR NEGATIVE NEGATIVE Final    Comment: (NOTE) SARS-CoV-2 target nucleic acids are NOT DETECTED. The SARS-CoV-2 RNA is generally detectable in upper respiratoy specimens during the acute phase of infection. The lowest concentration of SARS-CoV-2 viral copies this assay can detect is 131 copies/mL. A negative result does not preclude SARS-Cov-2 infection and should not be used as the sole basis for treatment or other patient management decisions. A negative result may occur with  improper specimen collection/handling, submission of specimen other than nasopharyngeal swab, presence of viral mutation(s) within the areas targeted by this assay, and inadequate number of viral copies (<131 copies/mL). A negative result must be combined with clinical observations, patient history, and epidemiological information. The expected result is  Negative. Fact Sheet for Patients:  PinkCheek.be Fact Sheet for Healthcare Providers:  GravelBags.it This test is not yet ap proved or cleared by the [place] FDA and  has been authorized for detection and/or diagnosis of SARS-CoV-2 by FDA under an Emergency Use Authorization (EUA). This EUA will remain  in effect (meaning this test can be used) for the duration of the COVID-19 declaration under Section 564(b)(1) of the Act, 21 U.S.C. section 360bbb-3(b)(1), unless the authorization is terminated or revoked sooner.  Influenza A by PCR NEGATIVE NEGATIVE Final   Influenza B by PCR NEGATIVE NEGATIVE Final    Comment: (NOTE) The Xpert Xpress SARS-CoV-2/FLU/RSV assay is intended as an aid in  the diagnosis of influenza from Nasopharyngeal swab specimens and  should not be used as a sole basis for treatment. Nasal washings and  aspirates are unacceptable for Xpert Xpress SARS-CoV-2/FLU/RSV  testing. Fact Sheet for Patients: https://www.moore.com/ Fact Sheet for Healthcare Providers: https://www.young.biz/ This test is not yet approved or cleared by the [place] FDA and  has been authorized for detection and/or diagnosis of SARS-CoV-2 by  FDA under an Emergency Use Authorization (EUA). This EUA will remain  in effect (meaning this test can be used) for the duration of the  Covid-19 declaration under Section 564(b)(1) of the Act, 21  U.S.C. section 360bbb-3(b)(1), unless the authorization is  terminated or revoked. Performed at [facility], [address] [place], [place] [address]   Culture, blood (routine x 2)     Status: Abnormal   Collection Time: 01/14/20  9:57 PM   Specimen: BLOOD LEFT HAND  Result Value Ref Range Status   Specimen Description BLOOD LEFT HAND  Final   Special Requests   Final    BOTTLES DRAWN AEROBIC AND ANAEROBIC Blood Culture results may not be  optimal due to an inadequate volume of blood received in culture bottles   Culture  Setup Time   Final    GRAM POSITIVE RODS ANAEROBIC BOTTLE ONLY CRITICAL RESULT CALLED TO, READ BACK BY AND VERIFIED WITH: G. ABBOTT,PHARMD [id number] 01/17/2020 T. TYSOR    Culture (A)  Final    ACTINOMYCES NAESLUNDII Standardized susceptibility testing for this organism is not available. Performed at [facility], [address], [place], [place] [address]    Report Status 01/19/2020 FINAL  Final  Culture, blood (routine x 2)     Status: None   Collection Time: 01/14/20 10:05 PM   Specimen: BLOOD  Result Value Ref Range Status   Specimen Description BLOOD LEFT ANTECUBITAL  Final   Special Requests   Final    BOTTLES DRAWN AEROBIC ONLY Blood Culture results may not be optimal due to an inadequate volume of blood received in culture bottles   Culture   Final    NO GROWTH 5 DAYS Performed at [facility], [address] [place], [place] [address]    Report Status 01/19/2020 FINAL  Final  MRSA PCR Screening     Status: None   Collection Time: 01/15/20  1:05 AM   Specimen: Nasal Mucosa; Nasopharyngeal  Result Value Ref Range Status   MRSA by PCR NEGATIVE NEGATIVE Final    Comment:        The GeneXpert MRSA Assay (FDA approved for NASAL specimens only), is one component of a comprehensive MRSA colonization surveillance program. It is not intended to diagnose MRSA infection nor to guide or monitor treatment for MRSA infections. Performed at [facility], [address], [place], [place] [address]   Urine culture     Status: None   Collection Time: 01/15/20  2:19 AM   Specimen: Urine, Random  Result Value Ref Range Status   Specimen Description URINE, RANDOM  Final   Special Requests NONE  Final   Culture   Final    NO GROWTH Performed at [facility], [address] [place], [place] [address]    Report Status 01/16/2020 FINAL  Final  Imaging: Renal US  01/20/20 IMPRESSION: 1. New mild right hydronephrosis. 2. Distended bladder without visualization of the Foley catheter balloon. Catheter repositioning recommended.  A/P: 44 yo man with DM, hidradenitis and HTN admitted for DKA with urinary retention and non-draining foley secondary to old blood clot.  -irrigated through 16Fr foley but old clot/sediment intermittently clogging -replaced 22Fr coude foley and irrigated til no more clot returned -continue foley for 7 days -continue flomax -pt to have void trial as outpatient at [facility] [phone]   Thank you for involving me in this patient's care, I will continue to follow along. Please page with any further questions or concerns. Woodward Klem D Demond Shallenberger

## 2020-01-20 NOTE — Progress Notes (Signed)
Physical Therapy Treatment Patient Details Name: Glenn Murphy MRN: [id number] DOB: Aug 11, 1976 Today's Date: 01/20/2020    History of Present Illness Patient is a 44 year old male with lifelong history of hidradenitis brought to the emergency room with hyperglycemia, elevated BHB and anion gap metabolic acidosis.    PT Comments    Pt needed a lot of encouragement to participate.  Pt slow to move, appeared significantly fatigued and needed light moderate assist in general.  Pt may refused to stay in the hospital long enough to improve optimally.  Today's session was minimal, but pt agreed to get OOB to a w/c.  Emphasis on warm up resistance exercise, transition to EOB, scooting, sitting balance, sit to stand and long transfer to the w/c.     Follow Up Recommendations  Home health PT     Equipment Recommendations  Rolling walker with 5" wheels;3in1 (PT)    Recommendations for Other Services       Precautions / Restrictions Precautions Precautions: Fall Restrictions Weight Bearing Restrictions: No    Mobility  Bed Mobility Overal bed mobility: Needs Assistance Bed Mobility: Supine to Sit     Supine to sit: Mod assist     General bed mobility comments: assist for LEs over EOB, cues for pt to self initiate trunk elevation  Transfers Overall transfer level: Needs assistance Equipment used: 2 person hand held assist;Quad cane Transfers: Sit to/from Stand Sit to Stand: +2 safety/equipment;Mod assist         General transfer comment: boosting assist from EOB and steadying initially   Ambulation/Gait Ambulation/Gait assistance: Min assist;+2 physical assistance Gait Distance (Feet): 5 Feet Assistive device: Straight cane Gait Pattern/deviations: Step-to pattern   Gait velocity interpretation: <1.31 ft/sec, indicative of household ambulator General Gait Details: pt was mildly unsteady on L knee/LE, small steps and uncontrolled descent.   Stairs              Wheelchair Mobility    Modified Rankin (Stroke Patients Only)       Balance Overall balance assessment: Needs assistance Sitting-balance support: Feet supported Sitting balance-Leahy Scale: Fair       Standing balance-Leahy Scale: Poor Standing balance comment: reliant on at least single UE support                            Cognition Arousal/Alertness: Awake/alert Behavior During Therapy: Flat affect Overall Cognitive Status: Difficult to assess                                 General Comments: pt appears to be [facility] for basic tasks, not very forthcoming with much information, requires encouragement to participate; decreased insight into need to stay in hospital      Exercises Other Exercises Other Exercises: general AAROM to bil LEs start of session    General Comments General comments (skin integrity, edema, etc.): HR up to low 120s with activity      Pertinent Vitals/Pain Pain Assessment: Faces Faces Pain Scale: Hurts little more Pain Location: bil knees (L>R) Pain Descriptors / Indicators: Aching Pain Intervention(s): Limited activity within patient's tolerance;Monitored during session    Home Living Family/patient expects to be discharged to:: Private residence Living Arrangements: Spouse/significant other Available Help at Discharge: Family;Available PRN/intermittently(wife works) Type of Home: House Home Access: Stairs to enter Entrance Stairs-Rails: None;Left(no rails back door; rail L front door) Home Layout: One level Home  Equipment: Kasandra Knudsen - quad(small based; reports plans to get shower chair)      Prior Function Level of Independence: Independent;Independent with assistive device(s);Needs assistance    ADL's / Homemaking Assistance Needed: appears spouse has been assisting with some ADL  Comments: 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; L knee worse than R, wears knee brace L typically   PT Goals (current goals can now be  found in the care plan section) Acute Rehab PT Goals Patient Stated Goal: home "today" PT Goal Formulation: With patient Time For Goal Achievement: 02/02/20 Potential to Achieve Goals: Good Progress towards PT goals: Progressing toward goals(pt limits himself)    Frequency    Min 3X/week      PT Plan Current plan remains appropriate    Co-evaluation PT/OT/SLP Co-Evaluation/Treatment: Yes Reason for Co-Treatment: Necessary to address cognition/behavior during functional activity;To address functional/ADL transfers PT goals addressed during session: Mobility/safety with mobility OT goals addressed during session: ADL's and self-care      AM-PAC PT "6 Clicks" Mobility   Outcome Measure  Help needed turning from your back to your side while in a flat bed without using bedrails?: A Little Help needed moving from lying on your back to sitting on the side of a flat bed without using bedrails?: A Little Help needed moving to and from a bed to a chair (including a wheelchair)?: A Little Help needed standing up from a chair using your arms (e.g., wheelchair or bedside chair)?: A Little Help needed to walk in hospital room?: A Little Help needed climbing 3-5 steps with a railing? : A Lot 6 Click Score: 17    End of Session   Activity Tolerance: Patient tolerated treatment well Patient left: in chair;with call bell/phone within reach;with family/visitor present Nurse Communication: Mobility status PT Visit Diagnosis: Unsteadiness on feet (R26.81);Other abnormalities of gait and mobility (R26.89);Muscle weakness (generalized) (M62.81)     Time: 0277-4128 PT Time Calculation (min) (ACUTE ONLY): 35 min  Charges:  $Therapeutic Activity: 8-22 mins                     01/20/2020  Ginger Carne., PT Acute Rehabilitation Services [phone]  (pager) [phone]  (office)   Tessie Fass Swara Donze 01/20/2020, 6:00 PM

## 2020-01-21 ENCOUNTER — Ambulatory Visit: Payer: Self-pay

## 2020-01-21 DIAGNOSIS — N179 Acute kidney failure, unspecified: Secondary | ICD-10-CM | POA: Diagnosis not present

## 2020-01-21 DIAGNOSIS — A429 Actinomycosis, unspecified: Secondary | ICD-10-CM

## 2020-01-21 DIAGNOSIS — L732 Hidradenitis suppurativa: Secondary | ICD-10-CM | POA: Diagnosis not present

## 2020-01-21 DIAGNOSIS — F17210 Nicotine dependence, cigarettes, uncomplicated: Secondary | ICD-10-CM | POA: Diagnosis not present

## 2020-01-21 DIAGNOSIS — Z91018 Allergy to other foods: Secondary | ICD-10-CM

## 2020-01-21 LAB — GLUCOSE, CAPILLARY
Glucose-Capillary: 106 mg/dL — ABNORMAL HIGH (ref 70–99)
Glucose-Capillary: 110 mg/dL — ABNORMAL HIGH (ref 70–99)
Glucose-Capillary: 112 mg/dL — ABNORMAL HIGH (ref 70–99)
Glucose-Capillary: 122 mg/dL — ABNORMAL HIGH (ref 70–99)
Glucose-Capillary: 143 mg/dL — ABNORMAL HIGH (ref 70–99)
Glucose-Capillary: 91 mg/dL (ref 70–99)

## 2020-01-21 LAB — BASIC METABOLIC PANEL
Anion gap: 11 (ref 5–15)
BUN: 38 mg/dL — ABNORMAL HIGH (ref 6–20)
CO2: 16 mmol/L — ABNORMAL LOW (ref 22–32)
Calcium: 8.2 mg/dL — ABNORMAL LOW (ref 8.9–10.3)
Chloride: 111 mmol/L (ref 98–111)
Creatinine, Ser: 1.66 mg/dL — ABNORMAL HIGH (ref 0.61–1.24)
GFR calc Af Amer: 56 mL/min — ABNORMAL LOW (ref >60)
GFR calc non Af Amer: 49 mL/min — ABNORMAL LOW (ref >60)
Glucose, Bld: 101 mg/dL — ABNORMAL HIGH (ref 70–99)
Potassium: 3.5 mmol/L (ref 3.5–5.1)
Sodium: 138 mmol/L (ref 135–145)

## 2020-01-21 LAB — CBC WITH DIFFERENTIAL/PLATELET
Abs Immature Granulocytes: 0.22 10*3/uL — ABNORMAL HIGH (ref 0.00–0.07)
Basophils Absolute: 0.1 10*3/uL (ref 0.0–0.1)
Basophils Relative: 1 %
Eosinophils Absolute: 0.1 10*3/uL (ref 0.0–0.5)
Eosinophils Relative: 1 %
HCT: 28.5 % — ABNORMAL LOW (ref 39.0–52.0)
Hemoglobin: 9.9 g/dL — ABNORMAL LOW (ref 13.0–17.0)
Immature Granulocytes: 2 %
Lymphocytes Relative: 16 %
Lymphs Abs: 2.1 10*3/uL (ref 0.7–4.0)
MCH: 32.6 pg (ref 26.0–34.0)
MCHC: 34.7 g/dL (ref 30.0–36.0)
MCV: 93.8 fL (ref 80.0–100.0)
Monocytes Absolute: 0.5 10*3/uL (ref 0.1–1.0)
Monocytes Relative: 4 %
Neutro Abs: 10.1 10*3/uL — ABNORMAL HIGH (ref 1.7–7.7)
Neutrophils Relative %: 76 %
Platelets: 167 10*3/uL (ref 150–400)
RBC: 3.04 MIL/uL — ABNORMAL LOW (ref 4.22–5.81)
RDW: 15.1 % (ref 11.5–15.5)
WBC: 13 10*3/uL — ABNORMAL HIGH (ref 4.0–10.5)
nRBC: 0 % (ref 0.0–0.2)

## 2020-01-21 LAB — ANCA TITERS
Atypical P-ANCA titer: <1:20 {titer}
C-ANCA: <1:20 {titer}
P-ANCA: <1:20 {titer}

## 2020-01-21 MED ORDER — INSULIN DETEMIR 100 UNIT/ML ~~LOC~~ SOLN
10.0000 [IU] | Freq: Two times a day (BID) | SUBCUTANEOUS | Status: DC
  Administered 2020-01-21 – 2020-01-24 (×8): 10 [IU] via SUBCUTANEOUS
  Filled 2020-01-21 (×10): qty 0.1

## 2020-01-21 MED ORDER — LACTATED RINGERS IV SOLN: INTRAVENOUS | Status: DC

## 2020-01-21 MED ORDER — INSULIN ASPART 100 UNIT/ML ~~LOC~~ SOLN
4.0000 [IU] | Freq: Three times a day (TID) | SUBCUTANEOUS | Status: DC
  Administered 2020-01-23 – 2020-01-24 (×4): 4 [IU] via SUBCUTANEOUS

## 2020-01-21 MED ORDER — AMOXICILLIN 500 MG PO CAPS: 500.0000 mg | ORAL_CAPSULE | Freq: Three times a day (TID) | ORAL | 0 refills | Status: DC

## 2020-01-21 MED ORDER — AMOXICILLIN 500 MG PO CAPS
500.0000 mg | ORAL_CAPSULE | Freq: Three times a day (TID) | ORAL | Status: DC
  Administered 2020-01-21 – 2020-01-23 (×6): 500 mg via ORAL
  Filled 2020-01-21 (×9): qty 1

## 2020-01-21 MED ORDER — AMOXICILLIN 500 MG PO CAPS: 500.0000 mg | ORAL_CAPSULE | Freq: Three times a day (TID) | ORAL | 1 refills | Status: DC

## 2020-01-21 MED FILL — AMOXICILLIN 500 MG CAPS: 500 | 30 days supply | Qty: 90 | Fill #0

## 2020-01-21 NOTE — Consult Note (Addendum)
[facility]       Reason for Consult: Actinomycosis    Referring Physician: Dr. Lupita Leash  Principal Problem:   DKA (diabetic ketoacidoses) ([place])   . amoxicillin  500 mg Oral Q8H  . Chlorhexidine Gluconate Cloth  6 each Topical Daily  . heparin  5,000 Units Subcutaneous Q8H  . insulin aspart  0-15 Units Subcutaneous TID WC  . insulin aspart  0-5 Units Subcutaneous QHS  . insulin aspart  6 Units Subcutaneous TID WC  . insulin detemir  20 Units Subcutaneous BID  . insulin starter kit- pen needles  1 kit Other Once  . mouth rinse  15 mL Mouth Rinse BID  . melatonin  3 mg Oral QHS  . sodium chloride flush  3 mL Intravenous Once  . 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  This can take time to eradicate and can be treated with oral amoxicillin for 3-6 months.    Antibiotics: amxocillin  HPI: Glenn Murphy is a 44 y.o. male with history of HS, DM, obesity with BMI of 46 here with DKA.  Noted to have more drainage from his HD lesions and blood cultures sent, positive for above.  Leukocytosis on admission to 23.9, afebrile.  Initially started on empiric antibiotics and transitioned to ceftriaxone.  Now blood culture positive as above.  No current fever or chills.  Wife at bedside gives most of the history.     Review of Systems:  Constitutional: negative for fevers, chills and anorexia Gastrointestinal: negative for diarrhea All other systems reviewed and are negative    Past Medical History:  Diagnosis Date  . Abscess    recurrent infections  . Arthritis   . Diabetes mellitus without complication ([place])   . Hypertension   . Obesity     Social History   Tobacco Use  . Smoking  status: Current Every Day Smoker    Packs/day: 0.50    Types: Cigarettes  Substance Use Topics  . Alcohol use: Yes    Comment: 40oz beer a day  fifth of liquor over  q 3-4 days  . Drug use: Yes    Frequency: 2.0 times per week    Types: Marijuana    Comment: 01/26/16    Family History  Problem Relation Age of Onset  . Cancer Father     Allergies  Allergen Reactions  . Other Other (See Comments)    Itchy throat-strawberries,watermelon    Physical Exam: Constitutional: in no apparent distress  Vitals:   01/21/20 0257 01/21/20 0612  BP: 132/79 125/81  Pulse: (!) 102 (!) 102  Resp: 16 19  Temp: (!) 97.5 F (36.4 C) 98.1 F (36.7 C)  SpO2: 100% 100%   EYES: anicteric Cardiovascular: Cor RRR Respiratory: clear; GI: obese, soft Musculoskeletal: no pedal edema noted Skin: negatives: HS lesion in groin, back is covered Neuro: non-focal  Lab Results  Component Value Date   WBC 13.0 (H) 01/21/2020   HGB 9.9 (L) 01/21/2020   HCT 28.5 (L) 01/21/2020   MCV 93.8 01/21/2020   PLT 167 01/21/2020    Lab Results  Component Value Date   CREATININE 1.66 (H) 01/21/2020   BUN 38 (H) 01/21/2020  NA 138 01/21/2020   K 3.5 01/21/2020   CL 111 01/21/2020   CO2 16 (L) 01/21/2020    Lab Results  Component Value Date   ALT 24 01/19/2020   AST 35 01/19/2020   ALKPHOS 134 (H) 01/19/2020     Microbiology: Recent Results (from the past 240 hour(s))  Respiratory Panel by RT PCR (Flu A&B, Covid) - Nasopharyngeal Swab     Status: None   Collection Time: 01/14/20  8:22 PM   Specimen: Nasopharyngeal Swab  Result Value Ref Range Status   SARS Coronavirus 2 by RT PCR NEGATIVE NEGATIVE Final    Comment: (NOTE) SARS-CoV-2 target nucleic acids are NOT DETECTED. The SARS-CoV-2 RNA is generally detectable in upper respiratoy specimens during the acute phase of infection. The lowest concentration of SARS-CoV-2 viral copies this assay can detect is 131 copies/mL. A negative result does  not preclude SARS-Cov-2 infection and should not be used as the sole basis for treatment or other patient management decisions. A negative result may occur with  improper specimen collection/handling, submission of specimen other than nasopharyngeal swab, presence of viral mutation(s) within the areas targeted by this assay, and inadequate number of viral copies (<131 copies/mL). A negative result must be combined with clinical observations, patient history, and epidemiological information. The expected result is Negative. Fact Sheet for Patients:  PinkCheek.be Fact Sheet for Healthcare Providers:  GravelBags.it This test is not yet ap proved or cleared by the [place] FDA and  has been authorized for detection and/or diagnosis of SARS-CoV-2 by FDA under an Emergency Use Authorization (EUA). This EUA will remain  in effect (meaning this test can be used) for the duration of the COVID-19 declaration under Section 564(b)(1) of the Act, 21 U.S.C. section 360bbb-3(b)(1), unless the authorization is terminated or revoked sooner.    Influenza A by PCR NEGATIVE NEGATIVE Final   Influenza B by PCR NEGATIVE NEGATIVE Final    Comment: (NOTE) The Xpert Xpress SARS-CoV-2/FLU/RSV assay is intended as an aid in  the diagnosis of influenza from Nasopharyngeal swab specimens and  should not be used as a sole basis for treatment. Nasal washings and  aspirates are unacceptable for Xpert Xpress SARS-CoV-2/FLU/RSV  testing. Fact Sheet for Patients: PinkCheek.be Fact Sheet for Healthcare Providers: GravelBags.it This test is not yet approved or cleared by the [place] FDA and  has been authorized for detection and/or diagnosis of SARS-CoV-2 by  FDA under an Emergency Use Authorization (EUA). This EUA will remain  in effect (meaning this test can be used) for the duration of the    Covid-19 declaration under Section 564(b)(1) of the Act, 21  U.S.C. section 360bbb-3(b)(1), unless the authorization is  terminated or revoked. Performed at [facility], [place] [address] [place], [place] [address]   Culture, blood (routine x 2)     Status: Abnormal   Collection Time: 01/14/20  9:57 PM   Specimen: BLOOD LEFT HAND  Result Value Ref Range Status   Specimen Description BLOOD LEFT HAND  Final   Special Requests   Final    BOTTLES DRAWN AEROBIC AND ANAEROBIC Blood Culture results may not be optimal due to an inadequate volume of blood received in culture bottles   Culture  Setup Time   Final    GRAM POSITIVE RODS ANAEROBIC BOTTLE ONLY CRITICAL RESULT CALLED TO, READ BACK BY AND VERIFIED WITH: G. ABBOTT,PHARMD [id number] 01/17/2020 T. TYSOR    Culture (A)  Final    ACTINOMYCES NAESLUNDII Standardized  susceptibility testing for this organism is not available. Performed at [facility], [place] [address], [place], [place] [address]    Report Status 01/19/2020 FINAL  Final  Culture, blood (routine x 2)     Status: None   Collection Time: 01/14/20 10:05 PM   Specimen: BLOOD  Result Value Ref Range Status   Specimen Description BLOOD LEFT ANTECUBITAL  Final   Special Requests   Final    BOTTLES DRAWN AEROBIC ONLY Blood Culture results may not be optimal due to an inadequate volume of blood received in culture bottles   Culture   Final    NO GROWTH 5 DAYS Performed at [facility], [place] [address], [place], [place] [address]    Report Status 01/19/2020 FINAL  Final  MRSA PCR Screening     Status: None   Collection Time: 01/15/20  1:05 AM   Specimen: Nasal Mucosa; Nasopharyngeal  Result Value Ref Range Status   MRSA by PCR NEGATIVE NEGATIVE Final    Comment:        The GeneXpert MRSA Assay (FDA approved for NASAL specimens only), is one component of a comprehensive MRSA colonization surveillance program. It is not intended to diagnose MRSA infection nor to  guide or monitor treatment for MRSA infections. Performed at [facility], [place] [address] [place], [place] [address]   Urine culture     Status: None   Collection Time: 01/15/20  2:19 AM   Specimen: Urine, Random  Result Value Ref Range Status   Specimen Description URINE, RANDOM  Final   Special Requests NONE  Final   Culture   Final    NO GROWTH Performed at [facility], [place] [address], [place], [place] [address]    Report Status 01/16/2020 FINAL  Final    [name], [place] [email or web address] 01/21/2020, 9:25 AM

## 2020-01-21 NOTE — Progress Notes (Addendum)
Patient self removed IV. Informed patient he is not to remove his own IV and we will need to place a new one for maintenance fluids. Patient states he understands and will not remove the next IV.

## 2020-01-21 NOTE — Progress Notes (Addendum)
PROGRESS NOTE    Glenn Murphy  UXN:[id number] DOB: 01-Feb-1976 DOA: 01/14/2020 PCP: Rogers Blocker, MD   Brief Narrative: 44 year old male with lifelong history of hidradenitis, diabetes mellitus, hypertension who in February was placed on Accutane and prednisone, prior to that has been on continuous antibiotics most recently doxycycline for about a year.On an evaluation at [facility] on 3/26 he was having blurry vision felt secondary to Accutane with little clinical improvement along with loss of appetite.  The Accutane was stopped on that visit.  Notes on that visit indicate he was started on Humira but it is not clear as to whether he has been taking it or not.  He was also started on clindamycin gel.  He also was started on Metformin in February. Presented to the ED with complaint of thirst, hurting all over lab results showed hyponatremia metabolic acidosis, anion gap BS to be more than 8 blood sugar 734 creatinine 2.0 found to have AKI/DKA, anion gap metabolic acidosis and was admitted to ICU on the bicarb drip. Chest x-ray 4/6 clear lungs without pleural effusion, renal ultrasound 4/7 normal renal ultrasound no hydronephrosis.  Initially on vancomycin/Zosyn subsequently discontinued and briefly received ceftriaxone till 4/9.  Blood culture negative, 1 set of blood culture grows Actinomyces Naeslundii, urine cx negative. Patient has been since weaned off insulin drip switched to long-acting and premeal insulin, off antibiotics and transferred to [facility] service 01/20/2020  Subjective:  Resting and trying to have bowel movement.  Wife at the bedside. Patient reports he gets panic attacks sometimes. Ate 25% of his meal, Lantus was held and so was Humalog this morning as sugar at 91-110.   Assessment & Plan:  DKA/anion gap metabolic acidosis/type 2 diabetes mellitus poorly controlled A1c at 11.2.DKA resolved. Off iv insulin. Pt started on Levemir 20 units twice daily,  aspart 6 units 3 times daily premeal and sliding scale.  This morning blood sugar on lower side Lantus and humalog was held, will  cut down Lantus to 10 units twice daily and premeal insulin to 4 units 3 times daily, and sliding scale.  Patient and wife educated on insulin use, diabetes chronic the following.  Monitor next 24 hours to adjust his insulin regimen for discharge home while creatinine stabilizes. Recent Labs  Lab 01/20/20 1109 01/20/20 1507 01/20/20 2048 01/21/20 0012 01/21/20 0726  GLUCAP 107* 146* 125* 91 110*   Urine retention: Renal ultrasound showed mild right hydronephrosis, distended bladder.  Patient had 3 Foley changed while in ICU and was not draining well, started retaining after Foley was removed and needed to be placed back 4/12, urology consulted and found to have old clot causing retention and nondraining Foley,replaced 22 French coud Foley and irrigated. Plan is to continue Foley for 7 days and follow-up with urology clinic, continue Flomax.  AKI, baseline creatinine 0.9 on 01/2016-unclear if progressive CKD in the setting of diabetes/hypertension: Creatinine down trended, today 1.6 continue gentle IV fluids.  Continue to hold his Maxide and will likely discontinue. Recent Labs  Lab 01/18/20 1630 01/19/20 0203 01/19/20 0911 01/20/20 0412 01/21/20 0523  BUN 50* 44* 42* 36* 38*  CREATININE 1.93* 1.84* 1.70* 1.47* 1.66*   Essential hypertension/sinus tachycardia:  BP stable. Home metoprolol and Maxide are on hold.    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 SEPSIS POA- now resolved.  Patient did receive broad spectrum antibiotic in [name] known hidradenitis suppurativa consistent with superinfection of  his skin-discussed/consult infectious disease and now placed on amoxicillin to be continued for next 3 to 51-month  Chronic hidradenitis, wound care on consult and hidradenitis lesions to  bilateral upper back, continue wound care.  Morbid obesity with body mass index is 43.01 kg/m: Will benefit with weight loss and healthy lifestyle.  DVT prophylaxis:Heparin Code Status: full  Family Communication: plan of care discussed with patient and wife at bedside.  Status is: Inpatient Remains inpatient appropriate because acute renal failure, management of his diabetes and titration of his insulin Dispo: The patient is from: Home              Anticipated d/c is to: Home w HH PT, Rolling walker with 5" wheels;3in1, ?WOC RN               Anticipated d/c date is: 1 day              Patient currently is not medically stable to d/c. Nutrition: Diet Order             Diet Carb Modified        Diet Carb Modified Fluid consistency: Thin; Room service appropriate? Yes  Diet effective now              Consultants: PCCM, Urology Procedures:see note Microbiology:see note  Medications: Scheduled Meds:  amoxicillin  500 mg Oral Q8H   Chlorhexidine Gluconate Cloth  6 each Topical Daily   heparin  5,000 Units Subcutaneous Q8H   insulin aspart  0-15 Units Subcutaneous TID WC   insulin aspart  0-5 Units Subcutaneous QHS   insulin aspart  6 Units Subcutaneous TID WC   insulin detemir  10 Units Subcutaneous BID   insulin starter kit- pen needles  1 kit Other Once   mouth rinse  15 mL Mouth Rinse BID   melatonin  3 mg Oral QHS   sodium chloride flush  3 mL Intravenous Once   tamsulosin  0.4 mg Oral QPC supper   Continuous Infusions:  lactated ringers      Antimicrobials: Anti-infectives (From admission, onward)    Start     Dose/Rate Route Frequency Ordered Stop   01/21/20 0930  amoxicillin (AMOXIL) capsule 500 mg     500 mg Oral Every 8 hours 01/21/20 0924     01/21/20 0000  amoxicillin (AMOXIL) 500 MG capsule     500 mg Oral 3 times daily 01/21/20 0931     01/21/20 0000  amoxicillin (AMOXIL) 500 MG capsule     500 mg Oral 3 times daily 01/21/20 1042     01/15/20 2200   vancomycin (VANCOREADY) IVPB 1500 mg/300 mL  Status:  Discontinued     1,500 mg 150 mL/hr over 120 Minutes Intravenous Every 24 hours 01/14/20 2154 01/15/20 1019   01/15/20 1500  cefTRIAXone (ROCEPHIN) 2 g in sodium chloride 0.9 % 100 mL IVPB  Status:  Discontinued     2 g 200 mL/hr over 30 Minutes Intravenous Every 24 hours 01/15/20 1019 01/17/20 1114   01/15/20 0400  piperacillin-tazobactam (ZOSYN) IVPB 3.375 g  Status:  Discontinued     3.375 g 12.5 mL/hr over 240 Minutes Intravenous Every 8 hours 01/14/20 2154 01/15/20 1019   01/14/20 2200  piperacillin-tazobactam (ZOSYN) IVPB 3.375 g     3.375 g 100 mL/hr over 30 Minutes Intravenous  Once 01/14/20 2154 01/14/20 2339   01/14/20 2200  vancomycin (VANCOCIN) 2,500 mg in sodium chloride 0.9 % 500 mL IVPB  2,500 mg 250 mL/hr over 120 Minutes Intravenous  Once 01/14/20 2154 01/15/20 0402        Objective: Vitals: Today's Vitals   01/21/20 0257 01/21/20 0500 01/21/20 0612 01/21/20 0730  BP: 132/79  125/81   Pulse: (!) 102  (!) 102   Resp: 16  19   Temp: (!) 97.5 F (36.4 C)  98.1 F (36.7 C)   TempSrc: Oral  Oral   SpO2: 100%  100%   Weight:  132.5 kg    Height:      PainSc:    6     Intake/Output Summary (Last 24 hours) at 01/21/2020 1059 Last data filed at 01/21/2020 0615 Gross per 24 hour  Intake 740 ml  Output 820 ml  Net -80 ml   Filed Weights   01/17/20 0451 01/18/20 0453 01/21/20 0500  Weight: 131.9 kg 132.1 kg 132.5 kg   Weight change:    Intake/Output from previous day: 04/12 0701 - 04/13 0700 In: 740 [P.O.:660] Out: 1210 [Urine:1210] Intake/Output this shift: No intake/output data recorded.  Examination:  General exam: AAOx3, obese, weak appearing. HEENT:Oral mucosa moist, Ear/Nose WNL grossly,dentition normal. Respiratory system: bilaterally clear,no wheezing or crackles,no use of accessory muscle, non tender. Cardiovascular system:S1 & S2 +, regular, No JVD. Gastrointestinal system:Abdomen  soft, obese,NT,ND, BS+. Nervous System:Alert, awake, moving extremities and grossly nonfocal Extremities: No edema, distal peripheral pulses palpable.  Skin:No rashes,no icterus. ACZ:YSAYTK muscle bulk,tone, power Skin changes chronic with hidranitis suppurativa- see wound care note above.  Data Reviewed: I have personally reviewed following labs and imaging studies CBC: Recent Labs  Lab 01/17/20 0011 01/18/20 0030 01/19/20 0203 01/20/20 0412 01/21/20 0523  WBC 13.8* 11.5* 8.6 11.1* 13.0*  NEUTROABS 11.3* 8.1* 6.1 8.0* 10.1*  HGB 11.0* 10.3* 10.3* 9.5* 9.9*  HCT 29.6* 27.8* 28.1* 26.7* 28.5*  MCV 87.1 88.8 89.8 90.5 93.8  PLT 101* 116* 115* 154 160   Basic Metabolic Panel: Recent Labs  Lab 01/15/20 0330 01/15/20 0546 01/15/20 1255 01/15/20 1458 01/16/20 1417 01/16/20 1417 01/17/20 0011 01/17/20 0809 01/18/20 1630 01/18/20 1630 01/19/20 0203 01/19/20 0911 01/19/20 1633 01/20/20 0412 01/21/20 0523  NA 129*   < >  --    < > 131*   < > 127*   < > 136  --  137 137  --  139 138  K 2.5*   < >  --    < > 2.9*   < > 3.4*   < > 3.1*   < > 3.0* 3.7 3.4* 3.3* 3.5  CL 101   < >  --    < > 104   < > 104   < > 110  --  110 110  --  111 111  CO2 10*   < >  --    < > 9*   < > 7*   < > 13*  --  15* 14*  --  16* 16*  GLUCOSE 249*   < >  --    < > 227*   < > 247*   < > 201*  --  214* 225*  --  107* 101*  BUN 57*   < >  --    < > 64*   < > 61*   < > 50*  --  44* 42*  --  36* 38*  CREATININE 2.30*   < >  --    < > 2.41*   < > 2.40*   < >  1.93*  --  1.84* 1.70*  --  1.47* 1.66*  CALCIUM 8.5*   < >  --    < > 8.1*   < > 8.0*   < > 7.7*  --  7.5* 7.3*  --  7.7* 8.2*  MG 2.2  --   --   --   --   --  2.0  --   --   --  2.1  --   --   --   --   PHOS 1.3*  --  1.4*  --  1.1*  --  1.9*  --   --   --  2.8  --   --   --   --    < > = values in this interval not displayed.   GFR: Estimated Creatinine Clearance: 75 mL/min (A) (by C-G formula based on SCr of 1.66 mg/dL (H)). Liver Function  Tests: Recent Labs  Lab 01/14/20 1930 01/17/20 1828 01/19/20 0203  AST 29 24 35  ALT _0 ALKPHOS 185* 127* 134*  BILITOT 3.3* 3.5* 3.7*  PROT 8.9* 7.6 7.8  ALBUMIN 2.5* 1.6* 1.4*   Recent Labs  Lab 01/14/20 1930  LIPASE 40   No results for input(s): AMMONIA in the last 168 hours. Coagulation Profile: No results for input(s): INR, PROTIME in the last 168 hours. Cardiac Enzymes: Recent Labs  Lab 01/16/20 1856  CKTOTAL 66   BNP (last 3 results) No results for input(s): PROBNP in the last 8760 hours. HbA1C: No results for input(s): HGBA1C in the last 72 hours. CBG: Recent Labs  Lab 01/20/20 1109 01/20/20 1507 01/20/20 2048 01/21/20 0012 01/21/20 0726  GLUCAP 107* 146* 125* 91 110*   Lipid Profile: No results for input(s): CHOL, HDL, LDLCALC, TRIG, CHOLHDL, LDLDIRECT in the last 72 hours. Thyroid Function Tests: No results for input(s): TSH, T4TOTAL, FREET4, T3FREE, THYROIDAB in the last 72 hours. Anemia Panel: No results for input(s): VITAMINB12, FOLATE, FERRITIN, TIBC, IRON, RETICCTPCT in the last 72 hours. Sepsis Labs: Recent Labs  Lab 01/14/20 2225 01/15/20 0330  PROCALCITON 47.90  --   LATICACIDVEN 4.2* 2.2*    Recent Results (from the past 240 hour(s))  Respiratory Panel by RT PCR (Flu A&B, Covid) - Nasopharyngeal Swab     Status: None   Collection Time: 01/14/20  8:22 PM   Specimen: Nasopharyngeal Swab  Result Value Ref Range Status   SARS Coronavirus 2 by RT PCR NEGATIVE NEGATIVE Final    Comment: (NOTE) SARS-CoV-2 target nucleic acids are NOT DETECTED. The SARS-CoV-2 RNA is generally detectable in upper respiratoy specimens during the acute phase of infection. The lowest concentration of SARS-CoV-2 viral copies this assay can detect is 131 copies/mL. A negative result does not preclude SARS-Cov-2 infection and should not be used as the sole basis for treatment or other patient management decisions. A negative result may occur with   improper specimen collection/handling, submission of specimen other than nasopharyngeal swab, presence of viral mutation(s) within the areas targeted by this assay, and inadequate number of viral copies (<131 copies/mL). A negative result must be combined with clinical observations, patient history, and epidemiological information. The expected result is Negative. Fact Sheet for Patients:  PinkCheek.be Fact Sheet for Healthcare Providers:  GravelBags.it This test is not yet ap proved or cleared by the [place] FDA and  has been authorized for detection and/or diagnosis of SARS-CoV-2 by FDA under an Emergency Use Authorization (EUA). This EUA will remain  in effect (  meaning this test can be used) for the duration of the COVID-19 declaration under Section 564(b)(1) of the Act, 21 U.S.C. section 360bbb-3(b)(1), unless the authorization is terminated or revoked sooner.    Influenza A by PCR NEGATIVE NEGATIVE Final   Influenza B by PCR NEGATIVE NEGATIVE Final    Comment: (NOTE) The Xpert Xpress SARS-CoV-2/FLU/RSV assay is intended as an aid in  the diagnosis of influenza from Nasopharyngeal swab specimens and  should not be used as a sole basis for treatment. Nasal washings and  aspirates are unacceptable for Xpert Xpress SARS-CoV-2/FLU/RSV  testing. Fact Sheet for Patients: PinkCheek.be Fact Sheet for Healthcare Providers: GravelBags.it This test is not yet approved or cleared by the [place] FDA and  has been authorized for detection and/or diagnosis of SARS-CoV-2 by  FDA under an Emergency Use Authorization (EUA). This EUA will remain  in effect (meaning this test can be used) for the duration of the  Covid-19 declaration under Section 564(b)(1) of the Act, 21  U.S.C. section 360bbb-3(b)(1), unless the authorization is  terminated or revoked. Performed at  [facility], [place] [address] [place], [place] [address]   Culture, blood (routine x 2)     Status: Abnormal   Collection Time: 01/14/20  9:57 PM   Specimen: BLOOD LEFT HAND  Result Value Ref Range Status   Specimen Description BLOOD LEFT HAND  Final   Special Requests   Final    BOTTLES DRAWN AEROBIC AND ANAEROBIC Blood Culture results may not be optimal due to an inadequate volume of blood received in culture bottles   Culture  Setup Time   Final    GRAM POSITIVE RODS ANAEROBIC BOTTLE ONLY CRITICAL RESULT CALLED TO, READ BACK BY AND VERIFIED WITH: G. ABBOTT,PHARMD 0233 01/17/2020 T. TYSOR    Culture (A)  Final    ACTINOMYCES NAESLUNDII Standardized susceptibility testing for this organism is not available. Performed at [facility], [place] [address], [place], [place] [address]    Report Status 01/19/2020 FINAL  Final  Culture, blood (routine x 2)     Status: None   Collection Time: 01/14/20 10:05 PM   Specimen: BLOOD  Result Value Ref Range Status   Specimen Description BLOOD LEFT ANTECUBITAL  Final   Special Requests   Final    BOTTLES DRAWN AEROBIC ONLY Blood Culture results may not be optimal due to an inadequate volume of blood received in culture bottles   Culture   Final    NO GROWTH 5 DAYS Performed at [facility], [place] [address] [place], [place] [address]    Report Status 01/19/2020 FINAL  Final  MRSA PCR Screening     Status: None   Collection Time: 01/15/20  1:05 AM   Specimen: Nasal Mucosa; Nasopharyngeal  Result Value Ref Range Status   MRSA by PCR NEGATIVE NEGATIVE Final    Comment:        The GeneXpert MRSA Assay (FDA approved for NASAL specimens only), is one component of a comprehensive MRSA colonization surveillance program. It is not intended to diagnose MRSA infection nor to guide or monitor treatment for MRSA infections. Performed at [facility], [place] [address] [place], [place] [address]   Urine culture     Status: None    Collection Time: 01/15/20  2:19 AM   Specimen: Urine, Random  Result Value Ref Range Status   Specimen Description URINE, RANDOM  Final   Special Requests NONE  Final  Culture   Final    NO GROWTH Performed at [facility], [place] [address], [place], [place] [address]    Report Status 01/16/2020 FINAL  Final      Radiology Studies: US RENAL  Result Date: 01/20/2020 CLINICAL DATA:  Acute kidney injury. EXAM: RENAL / URINARY TRACT ULTRASOUND COMPLETE COMPARISON:  Renal ultrasound dated January 15, 2020. FINDINGS: Right Kidney: Renal measurements: 14.1 x 7.1 x 6.1 cm = volume: 319 mL . Echogenicity within normal limits. No mass visualized. New mild hydronephrosis. Left Kidney: Renal measurements: 15.2 x 8.2 x 8.3 cm = volume: 541 mL. Echogenicity within normal limits. No mass or hydronephrosis visualized. Bladder: Distended bladder containing a small amount of debris. The Foley catheter balloon is not identified within the bladder. Other: Small perihepatic ascites. IMPRESSION: 1. New mild right hydronephrosis. 2. Distended bladder without visualization of the Foley catheter balloon. Catheter repositioning recommended. Electronically Signed   By: Titus Dubin M.D.   On: 01/20/2020 19:02     LOS: 7 days   Time spent: More than 50% of that time was spent in counseling and/or coordination of care.  Antonieta Pert, MD [facility]  01/21/2020, 10:59 AM

## 2020-01-21 NOTE — TOC Initial Note (Signed)
Transition of Care [facility]) - Initial/Assessment Note    Patient Details  Name: Glenn Murphy MRN: [id number] Date of Birth: 1976/09/18  Transition of [facility]) CM/SW Contact:    Kingsley Plan, RN Phone Number: 01/21/2020, 4:56 PM  Clinical Narrative:                 Discussed with patient and wife at bedside. Patient from home with wife. Confirmed face sheet information. Patient and wife agreeable to Home health PT. Provided Medicare.gov home health list.  Patient and wife's choices: 1, [facility] , spoke with Lupita Leash and she is unable to accept referral.  2, [facility] spoke to [place], she is unable to accept referral due to staffing 3, [facility] called left message.Awaiting call back. 4, [facility] [place] checking with insurance and will call NCM back.  5, [facility].   Will follow up in morning.   Called Zac with [employer] [facility] and ordered walker and 3 in 1   Expected Discharge Plan: Home w Home Health Services Barriers to Discharge: Continued Medical Work up   Patient Goals and CMS Choice Patient states their goals for this hospitalization and ongoing recovery are:: to return  to home CMS Medicare.gov Compare Post Acute Care list provided to:: Patient Choice offered to / list presented to : Patient, Spouse  Expected Discharge Plan and Services Expected Discharge Plan: Home w Home Health Services       Living arrangements for the past 2 months: Single Family Home                 DME Arranged: 3-N-1, Walker rolling DME Agency: [facility] Date DME Agency Contacted: 01/21/20                Prior Living Arrangements/Services Living arrangements for the past 2 months: Single Family Home Lives with:: Spouse Patient language and need for interpreter reviewed:: Yes        Need for Family Participation in Patient Care: Yes (Comment) Care giver support system in place?: Yes (comment)   Criminal Activity/Legal Involvement Pertinent to Current  Situation/Hospitalization: No - Comment as needed  Activities of Daily Living      Permission Sought/Granted   Permission granted to share information with : Yes, Verbal Permission Granted  Share Information with NAME: wife           Emotional Assessment Appearance:: Appears stated age Attitude/Demeanor/Rapport: Self-Confident, Engaged Affect (typically observed): Accepting Orientation: : Oriented to Self, Oriented to Place, Oriented to  Time, Oriented to Situation Alcohol / Substance Use: Not Applicable Psych Involvement: No (comment)  Admission diagnosis:  Metabolic encephalopathy [G93.41] AKI (acute kidney injury) (HCC) [N17.9] 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 DKA (diabetic ketoacidoses) (HCC) 01/15/2020  . Abscess 05/26/2014   PCP:  Gwenyth Bender, MD Pharmacy:   [facility] [address] [place]), [place] - [address] [place] [address] Phone: [phone] Fax: [phone]  [facility] - [place], [place] - [address] [place] [address] Phone: [phone] Fax: [phone]  [facility] [place], [place] - [address] [place] [address] Phone: [phone] Fax: [phone]     Social Determinants of Health (SDOH) Interventions    Readmission Risk Interventions No flowsheet data found.

## 2020-01-21 NOTE — Plan of Care (Signed)
  Problem: Education: Goal: Knowledge of General Education information will improve Description Including pain rating scale, medication(s)/side effects and non-pharmacologic comfort measures Outcome: Progressing   

## 2020-01-22 ENCOUNTER — Inpatient Hospital Stay (HOSPITAL_COMMUNITY): Payer: BLUE CROSS/BLUE SHIELD

## 2020-01-22 DIAGNOSIS — E1111 Type 2 diabetes mellitus with ketoacidosis with coma: Secondary | ICD-10-CM | POA: Diagnosis not present

## 2020-01-22 LAB — CBC WITH DIFFERENTIAL/PLATELET
Abs Immature Granulocytes: 0.12 10*3/uL — ABNORMAL HIGH (ref 0.00–0.07)
Basophils Absolute: 0 10*3/uL (ref 0.0–0.1)
Basophils Relative: 0 %
Eosinophils Absolute: 0.1 10*3/uL (ref 0.0–0.5)
Eosinophils Relative: 1 %
HCT: 26.9 % — ABNORMAL LOW (ref 39.0–52.0)
Hemoglobin: 9.3 g/dL — ABNORMAL LOW (ref 13.0–17.0)
Immature Granulocytes: 1 %
Lymphocytes Relative: 22 %
Lymphs Abs: 1.9 10*3/uL (ref 0.7–4.0)
MCH: 32.3 pg (ref 26.0–34.0)
MCHC: 34.6 g/dL (ref 30.0–36.0)
MCV: 93.4 fL (ref 80.0–100.0)
Monocytes Absolute: 0.4 10*3/uL (ref 0.1–1.0)
Monocytes Relative: 4 %
Neutro Abs: 6.2 10*3/uL (ref 1.7–7.7)
Neutrophils Relative %: 72 %
Platelets: 140 10*3/uL — ABNORMAL LOW (ref 150–400)
RBC: 2.88 MIL/uL — ABNORMAL LOW (ref 4.22–5.81)
RDW: 14.9 % (ref 11.5–15.5)
WBC: 8.7 10*3/uL (ref 4.0–10.5)
nRBC: 0 % (ref 0.0–0.2)

## 2020-01-22 LAB — BASIC METABOLIC PANEL
Anion gap: 12 (ref 5–15)
BUN: 35 mg/dL — ABNORMAL HIGH (ref 6–20)
CO2: 15 mmol/L — ABNORMAL LOW (ref 22–32)
Calcium: 8.1 mg/dL — ABNORMAL LOW (ref 8.9–10.3)
Chloride: 110 mmol/L (ref 98–111)
Creatinine, Ser: 1.56 mg/dL — ABNORMAL HIGH (ref 0.61–1.24)
GFR calc Af Amer: >60 mL/min (ref >60)
GFR calc non Af Amer: 52 mL/min — ABNORMAL LOW (ref >60)
Glucose, Bld: 104 mg/dL — ABNORMAL HIGH (ref 70–99)
Potassium: 3.6 mmol/L (ref 3.5–5.1)
Sodium: 137 mmol/L (ref 135–145)

## 2020-01-22 LAB — GLUCOSE, CAPILLARY
Glucose-Capillary: 88 mg/dL (ref 70–99)
Glucose-Capillary: 111 mg/dL — ABNORMAL HIGH (ref 70–99)
Glucose-Capillary: 91 mg/dL (ref 70–99)
Glucose-Capillary: 119 mg/dL — ABNORMAL HIGH (ref 70–99)
Glucose-Capillary: 183 mg/dL — ABNORMAL HIGH (ref 70–99)

## 2020-01-22 MED ORDER — HYDROMORPHONE HCL 1 MG/ML IJ SOLN
0.5000 mg | Freq: Once | INTRAMUSCULAR | Status: AC
  Administered 2020-01-22: 0.5 mg via INTRAVENOUS
  Filled 2020-01-22: qty 1

## 2020-01-22 NOTE — Progress Notes (Signed)
PROGRESS NOTE    Glenn Murphy  JSH:[id number] DOB: 10-Apr-1976 DOA: 01/14/2020 PCP: Rogers Blocker, MD   Brief Narrative: 45 year old male with lifelong history of hidradenitis, diabetes mellitus, hypertension who in February was placed on Accutane and prednisone, prior to that has been on continuous antibiotics most recently doxycycline for about a year.On an evaluation at [facility] on 3/26 he was having blurry vision felt secondary to Accutane with little clinical improvement along with loss of appetite.  The Accutane was stopped on that visit.  Notes on that visit indicate he was started on Humira but it is not clear as to whether he has been taking it or not.  He was also started on clindamycin gel.  He also was started on Metformin in February.  Presented to the ED with complaint of thirst, hurting all over. lab results showed hyponatremia, metabolic acidosis, anion gap BS to be more than 8 blood sugar 734 creatinine 2.0 found to have AKI/DKA, anion gap metabolic acidosis and was admitted to ICU on the bicarb drip. Chest x-ray 4/6 clear lungs without pleural effusion, renal ultrasound 4/7 normal renal ultrasound no hydronephrosis.  Initially on vancomycin/Zosyn subsequently discontinued and briefly received ceftriaxone till 4/9.  Blood culture , 1 set of blood culture grows Actinomyces Naeslundii, urine cx negative.  Patient has been since weaned off insulin drip switched to long-acting and premeal insulin, off antibiotics and transferred to [facility] service 01/20/2020  Subjective:  Patient seen and examined.  Overnight patient had distention, suprapubic discomfort.  There was no urine in the bladder.  Blood sugars are well controlled, however suprapubic discomfort was the major issue. Bladder scan did not show any urine.  Manual flushing of the catheter, unable to withdraw any water. Called and discussed with urology who saw patient at the bedside. Patient was wondering  about going home.  Wife at the bedside.   Assessment & Plan:  DKA/anion gap metabolic acidosis/type 2 diabetes mellitus poorly controlled A1c at 11.2.DKA resolved. Off iv insulin.  Patient was a started on long-acting insulin and prandial insulin, blood sugars low normal.  Currently on decreased doses of insulin.  Continue to monitor while in the hospital.  Will titrate according to the blood sugars. Recent Labs  Lab 01/21/20 1647 01/21/20 2047 01/22/20 0704 01/22/20 0730 01/22/20 1156  GLUCAP 143* 122* 88 111* 91   Urine retention: Recurrent.  Dysfunctional Foley catheter today.  Seen by urology.  Plan for cystoscopy.    AKI, baseline creatinine 0.9 on 01/2016-unclear if progressive CKD in the setting of diabetes/hypertension: Creatinine downtrending.  We will continue to monitor.  Recent Labs  Lab 01/19/20 0203 01/19/20 0911 01/20/20 0412 01/21/20 0523 01/22/20 0332  BUN 44* 42* 36* 38* 35*  CREATININE 1.84* 1.70* 1.47* 1.66* 1.56*   Essential hypertension/sinus tachycardia:  BP stable. Home metoprolol and Maxide are on hold.    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 SEPSIS POA- now resolved.  Patient did receive broad spectrum antibiotic in [place] known hidradenitis suppurativa consistent with superinfection of his skin-discussed/consult infectious disease and now placed on amoxicillin to be continued for next 3 to 35-month  Chronic hidradenitis, wound care on consult and hidradenitis lesions to bilateral upper back, continue wound care.  Morbid obesity with body mass index is 43.01 kg/m: Will benefit with weight loss and healthy lifestyle.  DVT prophylaxis:Heparin Code Status: full  Family Communication: plan of care discussed with patient and wife  at bedside.  Status is: Inpatient Remains inpatient appropriate because acute renal failure, management of his diabetes and titration of his insulin  Dispo: The patient is from: Home              Anticipated d/c is to: Home w HH PT, Rolling walker with 5" wheels;3in1, ?WOC RN               Anticipated d/c date is: 2 days.              Patient currently is not medically stable to d/c.  Urinary retention issues.  May need to go to surgery. Nutrition: Diet Order            Diet Carb Modified        Diet Carb Modified Fluid consistency: Thin; Room service appropriate? Yes  Diet effective now            Consultants: PCCM, Urology Procedures:see note Microbiology:see note  Medications: Scheduled Meds: . amoxicillin  500 mg Oral Q8H  . Chlorhexidine Gluconate Cloth  6 each Topical Daily  . heparin  5,000 Units Subcutaneous Q8H  . insulin aspart  0-15 Units Subcutaneous TID WC  . insulin aspart  0-5 Units Subcutaneous QHS  . insulin aspart  4 Units Subcutaneous TID WC  . insulin detemir  10 Units Subcutaneous BID  . insulin starter kit- pen needles  1 kit Other Once  . mouth rinse  15 mL Mouth Rinse BID  . melatonin  3 mg Oral QHS  . sodium chloride flush  3 mL Intravenous Once  . tamsulosin  0.4 mg Oral QPC supper   Continuous Infusions: . lactated ringers 100 mL/hr at 01/22/20 0810    Antimicrobials: Anti-infectives (From admission, onward)   Start     Dose/Rate Route Frequency Ordered Stop   01/21/20 0930  amoxicillin (AMOXIL) capsule 500 mg     500 mg Oral Every 8 hours 01/21/20 0924     01/21/20 0000  amoxicillin (AMOXIL) 500 MG capsule     500 mg Oral 3 times daily 01/21/20 0931     01/21/20 0000  amoxicillin (AMOXIL) 500 MG capsule     500 mg Oral 3 times daily 01/21/20 1042     01/15/20 2200  vancomycin (VANCOREADY) IVPB 1500 mg/300 mL  Status:  Discontinued     1,500 mg 150 mL/hr over 120 Minutes Intravenous Every 24 hours 01/14/20 2154 01/15/20 1019   01/15/20 1500  cefTRIAXone (ROCEPHIN) 2 g in sodium chloride 0.9 % 100 mL IVPB  Status:  Discontinued     2 g 200 mL/hr over 30 Minutes Intravenous Every 24  hours 01/15/20 1019 01/17/20 1114   01/15/20 0400  piperacillin-tazobactam (ZOSYN) IVPB 3.375 g  Status:  Discontinued     3.375 g 12.5 mL/hr over 240 Minutes Intravenous Every 8 hours 01/14/20 2154 01/15/20 1019   01/14/20 2200  piperacillin-tazobactam (ZOSYN) IVPB 3.375 g     3.375 g 100 mL/hr over 30 Minutes Intravenous  Once 01/14/20 2154 01/14/20 2339   01/14/20 2200  vancomycin (VANCOCIN) 2,500 mg in sodium chloride 0.9 % 500 mL IVPB     2,500 mg 250 mL/hr over 120 Minutes Intravenous  Once 01/14/20 2154 01/15/20 0402       Objective: Vitals: Today's Vitals   01/22/20 0815 01/22/20 1006 01/22/20 1106 01/22/20 1433  BP:    (!) 124/92  Pulse:    (!) 106  Resp:    16  Temp:  98 F (36.7 C)  TempSrc:    Oral  SpO2:    100%  Weight:      Height:      PainSc: 0-No pain 6  4      Intake/Output Summary (Last 24 hours) at 01/22/2020 1612 Last data filed at 01/22/2020 0424 Gross per 24 hour  Intake 1375.8 ml  Output 1050 ml  Net 325.8 ml   Filed Weights   01/17/20 0451 01/18/20 0453 01/21/20 0500  Weight: 131.9 kg 132.1 kg 132.5 kg   Weight change:    Intake/Output from previous day: 04/13 0701 - 04/14 0700 In: 1963.9 [P.O.:600; I.V.:1363.9] Out: 1050 [Urine:1050] Intake/Output this shift: No intake/output data recorded.  Examination:  General exam: AAOx3, obese, weak appearing.  Chronically sick looking. HEENT:Oral mucosa moist, Ear/Nose WNL grossly,dentition normal. Respiratory system: bilaterally clear,no wheezing or crackles,no use of accessory muscle, non tender. Cardiovascular system:S1 & S2 +, regular, No JVD. Gastrointestinal system:Abdomen soft, obese,NT,ND, BS+.  Suprapubic tenderness and full bladder present. Nervous System:Alert, awake, moving extremities and grossly nonfocal Extremities: No edema, distal peripheral pulses palpable.  Skin:No rashes,no icterus. QPR:FFMBWG muscle bulk,tone, power Skin changes chronic with hidranitis suppurativa- see  wound care note above.  Data Reviewed: I have personally reviewed following labs and imaging studies CBC: Recent Labs  Lab 01/18/20 0030 01/19/20 0203 01/20/20 0412 01/21/20 0523 01/22/20 0332  WBC 11.5* 8.6 11.1* 13.0* 8.7  NEUTROABS 8.1* 6.1 8.0* 10.1* 6.2  HGB 10.3* 10.3* 9.5* 9.9* 9.3*  HCT 27.8* 28.1* 26.7* 28.5* 26.9*  MCV 88.8 89.8 90.5 93.8 93.4  PLT 116* 115* 154 167 665*   Basic Metabolic Panel: Recent Labs  Lab 01/16/20 1417 01/16/20 1417 01/17/20 0011 01/17/20 0809 01/19/20 0203 01/19/20 0203 01/19/20 0911 01/19/20 1633 01/20/20 0412 01/21/20 0523 01/22/20 0332  NA 131*   < > 127*   < > 137  --  137  --  139 138 137  K 2.9*   < > 3.4*   < > 3.0*   < > 3.7 3.4* 3.3* 3.5 3.6  CL 104   < > 104   < > 110  --  110  --  111 111 110  CO2 9*   < > 7*   < > 15*  --  14*  --  16* 16* 15*  GLUCOSE 227*   < > 247*   < > 214*  --  225*  --  107* 101* 104*  BUN 64*   < > 61*   < > 44*  --  42*  --  36* 38* 35*  CREATININE 2.41*   < > 2.40*   < > 1.84*  --  1.70*  --  1.47* 1.66* 1.56*  CALCIUM 8.1*   < > 8.0*   < > 7.5*  --  7.3*  --  7.7* 8.2* 8.1*  MG  --   --  2.0  --  2.1  --   --   --   --   --   --   PHOS 1.1*  --  1.9*  --  2.8  --   --   --   --   --   --    < > = values in this interval not displayed.   GFR: Estimated Creatinine Clearance: 79.8 mL/min (A) (by C-G formula based on SCr of 1.56 mg/dL (H)). Liver Function Tests: Recent Labs  Lab 01/17/20 1828 01/19/20 0203  AST 24 35  ALT 23 24  ALKPHOS 127* 134*  BILITOT 3.5* 3.7*  PROT 7.6 7.8  ALBUMIN 1.6* 1.4*   No results for input(s): LIPASE, AMYLASE in the last 168 hours. No results for input(s): AMMONIA in the last 168 hours. Coagulation Profile: No results for input(s): INR, PROTIME in the last 168 hours. Cardiac Enzymes: Recent Labs  Lab 01/16/20 1856  CKTOTAL 66   BNP (last 3 results) No results for input(s): PROBNP in the last 8760 hours. HbA1C: No results for input(s): HGBA1C in  the last 72 hours. CBG: Recent Labs  Lab 01/21/20 1647 01/21/20 2047 01/22/20 0704 01/22/20 0730 01/22/20 1156  GLUCAP 143* 122* 88 111* 91   Lipid Profile: No results for input(s): CHOL, HDL, LDLCALC, TRIG, CHOLHDL, LDLDIRECT in the last 72 hours. Thyroid Function Tests: No results for input(s): TSH, T4TOTAL, FREET4, T3FREE, THYROIDAB in the last 72 hours. Anemia Panel: No results for input(s): VITAMINB12, FOLATE, FERRITIN, TIBC, IRON, RETICCTPCT in the last 72 hours. Sepsis Labs: No results for input(s): PROCALCITON, LATICACIDVEN in the last 168 hours.  Recent Results (from the past 240 hour(s))  Respiratory Panel by RT PCR (Flu A&B, Covid) - Nasopharyngeal Swab     Status: None   Collection Time: 01/14/20  8:22 PM   Specimen: Nasopharyngeal Swab  Result Value Ref Range Status   SARS Coronavirus 2 by RT PCR NEGATIVE NEGATIVE Final    Comment: (NOTE) SARS-CoV-2 target nucleic acids are NOT DETECTED. The SARS-CoV-2 RNA is generally detectable in upper respiratoy specimens during the acute phase of infection. The lowest concentration of SARS-CoV-2 viral copies this assay can detect is 131 copies/mL. A negative result does not preclude SARS-Cov-2 infection and should not be used as the sole basis for treatment or other patient management decisions. A negative result may occur with  improper specimen collection/handling, submission of specimen other than nasopharyngeal swab, presence of viral mutation(s) within the areas targeted by this assay, and inadequate number of viral copies (<131 copies/mL). A negative result must be combined with clinical observations, patient history, and epidemiological information. The expected result is Negative. Fact Sheet for Patients:  PinkCheek.be Fact Sheet for Healthcare Providers:  GravelBags.it This test is not yet ap proved or cleared by the [place] FDA and  has been  authorized for detection and/or diagnosis of SARS-CoV-2 by FDA under an Emergency Use Authorization (EUA). This EUA will remain  in effect (meaning this test can be used) for the duration of the COVID-19 declaration under Section 564(b)(1) of the Act, 21 U.S.C. section 360bbb-3(b)(1), unless the authorization is terminated or revoked sooner.    Influenza A by PCR NEGATIVE NEGATIVE Final   Influenza B by PCR NEGATIVE NEGATIVE Final    Comment: (NOTE) The Xpert Xpress SARS-CoV-2/FLU/RSV assay is intended as an aid in  the diagnosis of influenza from Nasopharyngeal swab specimens and  should not be used as a sole basis for treatment. Nasal washings and  aspirates are unacceptable for Xpert Xpress SARS-CoV-2/FLU/RSV  testing. Fact Sheet for Patients: PinkCheek.be Fact Sheet for Healthcare Providers: GravelBags.it This test is not yet approved or cleared by the [place] FDA and  has been authorized for detection and/or diagnosis of SARS-CoV-2 by  FDA under an Emergency Use Authorization (EUA). This EUA will remain  in effect (meaning this test can be used) for the duration of the  Covid-19 declaration under Section 564(b)(1) of the Act, 21  U.S.C. section 360bbb-3(b)(1), unless the authorization is  terminated or revoked. Performed at [facility], [address] [place], [place] [address]   Culture, blood (routine x 2)     Status: Abnormal   Collection Time: 01/14/20  9:57 PM   Specimen: BLOOD LEFT HAND  Result Value Ref Range Status   Specimen Description BLOOD LEFT HAND  Final   Special Requests   Final    BOTTLES DRAWN AEROBIC AND ANAEROBIC Blood Culture results may not be optimal due to an inadequate volume of blood received in culture bottles   Culture  Setup Time   Final    GRAM POSITIVE RODS ANAEROBIC BOTTLE ONLY CRITICAL RESULT CALLED TO, READ BACK BY AND VERIFIED WITH: G. ABBOTT,PHARMD 5300 01/17/2020 T.  TYSOR    Culture (A)  Final    ACTINOMYCES NAESLUNDII Standardized susceptibility testing for this organism is not available. Performed at [facility], [place] [address] [place], [place] [address]    Report Status 01/19/2020 FINAL  Final  Culture, blood (routine x 2)     Status: None   Collection Time: 01/14/20 10:05 PM   Specimen: BLOOD  Result Value Ref Range Status   Specimen Description BLOOD LEFT ANTECUBITAL  Final   Special Requests   Final    BOTTLES DRAWN AEROBIC ONLY Blood Culture results may not be optimal due to an inadequate volume of blood received in culture bottles   Culture   Final    NO GROWTH 5 DAYS Performed at [facility], [place] [address], [place], [place] [address]    Report Status 01/19/2020 FINAL  Final  MRSA PCR Screening     Status: None   Collection Time: 01/15/20  1:05 AM   Specimen: Nasal Mucosa; Nasopharyngeal  Result Value Ref Range Status   MRSA by PCR NEGATIVE NEGATIVE Final    Comment:        The GeneXpert MRSA Assay (FDA approved for NASAL specimens only), is one component of a comprehensive MRSA colonization surveillance program. It is not intended to diagnose MRSA infection nor to guide or monitor treatment for MRSA infections. Performed at [facility], [place] [address] [place], [place] [address]   Urine culture     Status: None   Collection Time: 01/15/20  2:19 AM   Specimen: Urine, Random  Result Value Ref Range Status   Specimen Description URINE, RANDOM  Final   Special Requests NONE  Final   Culture   Final    NO GROWTH Performed at [facility], [place] [address], [place], [place] [address]    Report Status 01/16/2020 FINAL  Final      Radiology Studies: US RENAL  Result Date: 01/20/2020 CLINICAL DATA:  Acute kidney injury. EXAM: RENAL / URINARY TRACT ULTRASOUND COMPLETE COMPARISON:  Renal ultrasound dated January 15, 2020. FINDINGS: Right Kidney: Renal measurements: 14.1 x 7.1 x 6.1 cm = volume: 319 mL .  Echogenicity within normal limits. No mass visualized. New mild hydronephrosis. Left Kidney: Renal measurements: 15.2 x 8.2 x 8.3 cm = volume: 541 mL. Echogenicity within normal limits. No mass or hydronephrosis visualized. Bladder: Distended bladder containing a small amount of debris. The Foley catheter balloon is not identified within the bladder. Other: Small perihepatic ascites. IMPRESSION: 1. New mild right hydronephrosis. 2. Distended bladder without visualization of the Foley catheter balloon. Catheter repositioning recommended. Electronically Signed   By: Titus Dubin M.D.   On: 01/20/2020 19:02   CT RENAL STONE STUDY  Result Date: 01/22/2020 CLINICAL DATA:  Inpatient. Bladder mass. Bladder pain/fullness. EXAM: CT  ABDOMEN AND PELVIS WITHOUT CONTRAST TECHNIQUE: Multidetector CT imaging of the abdomen and pelvis was performed following the standard protocol without IV contrast. COMPARISON:  01/20/2020 renal sonogram. FINDINGS: Lower chest: Two peripheral left lower lobe pulmonary nodules, largest 6 mm (series 4/image 9). Coronary atherosclerosis. Hepatobiliary: Diffuse hepatic steatosis. Slight hypertrophy of the lateral segment left liver lobe. Questionable fine liver surface irregularity, cannot exclude cirrhosis. No liver mass. Normal gallbladder with no radiopaque cholelithiasis. No biliary ductal dilatation. Pancreas: Normal, with no mass or duct dilation. Spleen: Normal size. No mass. Adrenals/Urinary Tract: Normal adrenals. No contour deforming renal masses. Mild bilateral hydroureteronephrosis to the level of the ureterovesical junctions bilaterally. No renal or ureteral stones. Prominently distended urinary bladder. Gas in the nondependent bladder. No bladder stones or wall thickening. Stomach/Bowel: Normal non-distended stomach. Normal caliber small bowel with no small bowel wall thickening. Normal appendix. Minimal left colonic diverticulosis with no definite large bowel wall thickening.  Large bowel is largely collapsed. Vascular/Lymphatic: Atherosclerotic nonaneurysmal abdominal aorta. Small paraumbilical varix. Mild porta hepatis adenopathy up to 1.6 cm (series 3/image 31). No additional pathologically enlarged lymph nodes in the abdomen or pelvis. Reproductive: Foley catheter is malpositioned with Foley balloon distended within the bulbar urethra, with the tip of the Foley within right prostatic peripheral zone (series 3/image 88), probably outside of the urethra. There is scattered soft tissue emphysema surrounding the Foley balloon. Prostate size appears normal. Other: No pneumoperitoneum. Small volume ascites. No focal fluid collection. Musculoskeletal: No aggressive appearing focal osseous lesions. Marked lower lumbar spondylosis. IMPRESSION: 1. Foley catheter is malpositioned with Foley balloon distended within the bulbar urethra, with the tip of the Foley within the right prostatic peripheral zone, probably outside of the urethra. Scattered soft tissue emphysema surrounding the Foley balloon. 2. Evidence of bladder outlet obstruction with prominently distended bladder and mild bilateral hydroureteronephrosis. No urolithiasis. 3. Diffuse hepatic steatosis. Subtle morphologic changes in the liver suggestive of cirrhosis. No liver mass. Small volume ascites. Small paraumbilical varix. 4. Nonspecific mild porta hepatis adenopathy, potentially reactive. 5. Two left lower lobe pulmonary nodules, largest 6 mm. Non-contrast chest CT at 3-6 months is recommended. If the nodules are stable at time of repeat CT, then future CT at 18-24 months (from today's scan) is considered optional for low-risk patients, but is recommended for high-risk patients. This recommendation follows the consensus statement: Guidelines for Management of Incidental Pulmonary Nodules Detected on CT Images: From the Fleischner Society 2017; Radiology 2017; 284:228-243. 6. Coronary atherosclerosis. 7. Aortic Atherosclerosis  (ICD10-I70.0). These results were called by telephone at the time of interpretation on 01/22/2020 at 3:04 pm to provider [facility] , who verbally acknowledged these results. Electronically Signed   By: Ilona Sorrel M.D.   On: 01/22/2020 15:24     LOS: 8 days    Total time spent: 30 minutes  Time spent: More than 50% of that time was spent in counseling and/or coordination of care.  [name], MD [facility]  01/22/2020, 4:12 PM

## 2020-01-22 NOTE — Plan of Care (Signed)
  Problem: Education: Goal: Knowledge of General Education information will improve Description Including pain rating scale, medication(s)/side effects and non-pharmacologic comfort measures Outcome: Progressing   

## 2020-01-22 NOTE — Progress Notes (Signed)
I was called at approximately 11:30 AM by Dr. Jerral Ralph for help with catheter drainage situation.  The patient has decreased urinary output, has feeling of bladder fullness, and catheter irrigations have been unsuccessful-saline can be instilled but not returned.  I came to the patient's bedside.  Catheter was irrigated unsuccessfully.  His 22 French coud tip catheter was removed.  I did not necessarily see clot in the drainage tube.  He did have spontaneous urine drainage once the catheter was removed but still felt full.  Following sterile prep and drape, I tried placing an 50 French coud tip catheter.  This passed, but I do not think appropriately positioned itself in the bladder.  Following this, this was removed.  I then performed flexible cystoscopy.  I felt that I was in the bladder, and that there was significant clot/fibrinous matter within the bladder.  I could not adequately see the bladder walls, however.  Thinking that there was clot limiting drainage, I removed the cystoscope and passed a 24 French hematuria catheter.  Balloon was filled with 20 cc of water.  I tried hand irrigation but this was unsuccessful.  The catheter was left in place, to bag drainage.  I then had noncontrasted CT performed.  This revealed presence of the catheter within, more than likely, false passage or prostatic urethra.  The bladder was distended.  I returned [phone] and discussed with the patient and his wife the need for bedside cystoscopy with attempt at placing a catheter using a guidewire.  The patient was again sterilely prepped and draped.  Cystoscope was passed into the urethra.  There was a large cavity in the proximal urethral/prostatic urethral area.  I was unable to guide the cystoscope into what I thought was the bladder.  There was significant fibrinous and exudative tissue in this region.  I then passed a sensor tip guidewire through the cystoscope, hoping that it would spontaneously pass into the  bladder.  Once the guidewire was passed through the cystoscope, the cystoscope was removed and I passed an 39 [place] council tip catheter.  This was passed over top of the guidewire.  This was not painful for the patient.  Approximately 50 cc of urine was obtained.  Prior to this, the patient did have spontaneous voiding of a significant amount of urine.  Balloon was filled with 10 cc of water and hooked to dependent drainage.  The patient did complain of significant rectal and anal pain during the cystoscopy.  Because of this, I performed a rectal exam.  I did not feel any transrectal abnormality.  I will have bladder ultrasound performed to see if balloon can adequately be seen in the patient's bladder.  If so, we will leave the catheter and I would suggest these be left in for approximately 2 weeks to allow for healing of the proximal urethral/prostatic urethral area.  If it does not seem to be adequately positioned and there is still bladder distention, thought should be given to interventional radiology placement of a suprapubic tube.  I spent approximately 1-1/2 hours at the patient's bedside today.

## 2020-01-22 NOTE — TOC Progression Note (Signed)
Transition of Care [facility]) - Progression Note    Patient Details  Name: ZEBULIN SIEGEL MRN: [id number] Date of Birth: 13-Nov-1975  Transition of Care [facility]) CM/SW Contact  Nadene Rubins Adria Devon, RN Phone Number: 01/22/2020, 11:12 AM  Clinical Narrative:     [facility] unable to accept patient, due to insurance being out of network.   Kandee Keen with [facility] accepted referral.  Expected Discharge Plan: Home w Home Health Services Barriers to Discharge: Continued Medical Work up  Expected Discharge Plan and Services Expected Discharge Plan: Home w Home Health Services       Living arrangements for the past 2 months: Single Family Home                 DME Arranged: 3-N-1, Walker rolling DME Agency: [employer] Date DME Agency Contacted: 01/21/20                 Social Determinants of Health (SDOH) Interventions    Readmission Risk Interventions No flowsheet data found.

## 2020-01-22 NOTE — Progress Notes (Signed)
Informed patient and wife, visitors are not allowed to stay over night.

## 2020-01-22 NOTE — Progress Notes (Signed)
No urine out put since 0430, patient complaining of fullness in his abdomen/bladder. Bladder scan completed . Irrigated foley 61mL, per Dr. Jerral Ralph. Not able to pull back any fluid.  MD at bedside, consulting urology.

## 2020-01-22 NOTE — Progress Notes (Signed)
PT Cancellation Note  Patient Details Name: Glenn Murphy MRN: [id number] DOB: Jun 20, 1976   Cancelled Treatment:    Reason Eval/Treat Not Completed: Other (comment);Pain limiting ability to participate;Patient at procedure or test/unavailable.  Pt declined after coming back from CT scan, and agreed to therapy tomorrow.  Will attempt to see with OT and increase likelihood of agreement.   Ivar Drape 01/22/2020, 4:09 PM  Samul Dada, PT MS Acute Rehab Dept. Number: [facility] [id number] and [facility] [phone]

## 2020-01-22 NOTE — Progress Notes (Signed)
OT Cancellation Note  Patient Details Name: Glenn Murphy MRN: [id number] DOB: [birth year]   Cancelled Treatment:    Reason Eval/Treat Not Completed: Patient declined, no reason specified Pt refusing participation in OT upon arrival. States he is in a lot of pain from tests this AM and agrees to continue to therapy tomorrow. Will continue to follow as available and appropriate to progress OT POC.   Dalphine Handing, MSOT, OTR/L Acute Rehabilitation Services [facility] Office Number: [phone] Pager: [phone]  Dalphine Handing 01/22/2020, 6:28 PM

## 2020-01-23 ENCOUNTER — Encounter (HOSPITAL_COMMUNITY): Payer: Self-pay | Admitting: Specialist

## 2020-01-23 ENCOUNTER — Inpatient Hospital Stay (HOSPITAL_COMMUNITY): Payer: BLUE CROSS/BLUE SHIELD

## 2020-01-23 DIAGNOSIS — E1111 Type 2 diabetes mellitus with ketoacidosis with coma: Secondary | ICD-10-CM | POA: Diagnosis not present

## 2020-01-23 LAB — GLUCOSE, CAPILLARY
Glucose-Capillary: 110 mg/dL — ABNORMAL HIGH (ref 70–99)
Glucose-Capillary: 89 mg/dL (ref 70–99)
Glucose-Capillary: 145 mg/dL — ABNORMAL HIGH (ref 70–99)
Glucose-Capillary: 146 mg/dL — ABNORMAL HIGH (ref 70–99)

## 2020-01-23 LAB — BASIC METABOLIC PANEL
Anion gap: 10 (ref 5–15)
BUN: 27 mg/dL — ABNORMAL HIGH (ref 6–20)
CO2: 17 mmol/L — ABNORMAL LOW (ref 22–32)
Calcium: 8.4 mg/dL — ABNORMAL LOW (ref 8.9–10.3)
Chloride: 109 mmol/L (ref 98–111)
Creatinine, Ser: 1.49 mg/dL — ABNORMAL HIGH (ref 0.61–1.24)
GFR calc Af Amer: >60 mL/min (ref >60)
GFR calc non Af Amer: 55 mL/min — ABNORMAL LOW (ref >60)
Glucose, Bld: 143 mg/dL — ABNORMAL HIGH (ref 70–99)
Potassium: 3.7 mmol/L (ref 3.5–5.1)
Sodium: 136 mmol/L (ref 135–145)

## 2020-01-23 LAB — CBC WITH DIFFERENTIAL/PLATELET
Abs Immature Granulocytes: 0 10*3/uL (ref 0.00–0.07)
Basophils Absolute: 0 10*3/uL (ref 0.0–0.1)
Basophils Relative: 0 %
Eosinophils Absolute: 0 10*3/uL (ref 0.0–0.5)
Eosinophils Relative: 0 %
HCT: 27.4 % — ABNORMAL LOW (ref 39.0–52.0)
Hemoglobin: 9.4 g/dL — ABNORMAL LOW (ref 13.0–17.0)
Lymphocytes Relative: 10 %
Lymphs Abs: 1 10*3/uL (ref 0.7–4.0)
MCH: 32.9 pg (ref 26.0–34.0)
MCHC: 34.3 g/dL (ref 30.0–36.0)
MCV: 95.8 fL (ref 80.0–100.0)
Monocytes Absolute: 0.3 10*3/uL (ref 0.1–1.0)
Monocytes Relative: 3 %
Neutro Abs: 8.6 10*3/uL — ABNORMAL HIGH (ref 1.7–7.7)
Neutrophils Relative %: 87 %
Platelets: 215 10*3/uL (ref 150–400)
RBC: 2.86 MIL/uL — ABNORMAL LOW (ref 4.22–5.81)
RDW: 15 % (ref 11.5–15.5)
WBC: 9.9 10*3/uL (ref 4.0–10.5)
nRBC: 0 /100 WBC
nRBC: 0.2 % (ref 0.0–0.2)

## 2020-01-23 LAB — PHOSPHORUS: Phosphorus: 4.5 mg/dL (ref 2.5–4.6)

## 2020-01-23 LAB — PROTIME-INR
INR: 1.3 — ABNORMAL HIGH (ref 0.8–1.2)
Prothrombin Time: 16.1 seconds — ABNORMAL HIGH (ref 11.4–15.2)

## 2020-01-23 LAB — MAGNESIUM: Magnesium: 1.9 mg/dL (ref 1.7–2.4)

## 2020-01-23 MED ORDER — SODIUM CHLORIDE 0.9% FLUSH: 10.0000 mL | INTRAVENOUS | Status: DC | PRN

## 2020-01-23 MED ORDER — SODIUM CHLORIDE 0.9 % IV SOLN
INTRAVENOUS | Status: DC | PRN
  Administered 2020-01-23: 15:00:00 250 mL via INTRAVENOUS

## 2020-01-23 MED ORDER — MIDAZOLAM HCL 2 MG/2ML IJ SOLN
INTRAMUSCULAR | Status: AC
  Filled 2020-01-23: qty 2

## 2020-01-23 MED ORDER — FENTANYL CITRATE (PF) 100 MCG/2ML IJ SOLN
INTRAMUSCULAR | Status: AC
  Filled 2020-01-23: qty 2

## 2020-01-23 MED ORDER — SODIUM CHLORIDE 0.9% FLUSH: 10.0000 mL | Freq: Two times a day (BID) | INTRAVENOUS | Status: DC

## 2020-01-23 MED ORDER — MORPHINE SULFATE (PF) 4 MG/ML IV SOLN
4.0000 mg | INTRAVENOUS | Status: DC | PRN
  Administered 2020-01-23 – 2020-01-25 (×5): 4 mg via INTRAVENOUS
  Filled 2020-01-23 (×6): qty 1

## 2020-01-23 MED ORDER — MIDAZOLAM HCL 2 MG/2ML IJ SOLN
INTRAMUSCULAR | Status: AC | PRN
  Administered 2020-01-23 (×2): 1 mg via INTRAVENOUS

## 2020-01-23 MED ORDER — FENTANYL CITRATE (PF) 100 MCG/2ML IJ SOLN
INTRAMUSCULAR | Status: AC | PRN
  Administered 2020-01-23 (×2): 50 ug via INTRAVENOUS

## 2020-01-23 MED ORDER — LIDOCAINE HCL 1 % IJ SOLN
INTRAMUSCULAR | Status: AC
  Filled 2020-01-23: qty 20

## 2020-01-23 MED ORDER — PIPERACILLIN-TAZOBACTAM 3.375 G IVPB
3.3750 g | Freq: Three times a day (TID) | INTRAVENOUS | Status: DC
  Administered 2020-01-23 – 2020-01-25 (×6): 3.375 g via INTRAVENOUS
  Filled 2020-01-23 (×6): qty 50

## 2020-01-23 NOTE — Consult Note (Signed)
Chief Complaint: Patient was seen in consultation today for CT guided suprapubic catheter placement Chief Complaint  Patient presents with  . Weakness  . Shortness of Breath  . Hematuria     Referring Physician(s): Dahlstedt,S  Supervising Physician: Markus Daft  Patient Status: [facility] - In-pt  History of Present Illness: Glenn Murphy is a 44 y.o. male with PMH DM,HTN, obesity admitted to [facility]  01/14/20 with DKA/hematuria/urinary retention. Pt subsequently evaluated by urology because of poorly functioning foley cath/clotting and underwent exchange.  Patient now with very minimal bloody drainage from Foley catheter and also having some bloody rectal drainage since this a.m.  CT of the pelvis performed today revealed:  1. Urethral injury is confirmed with contrast outlining extraperitoneal spaces of the pelvis, extending along the bladder base, outlining and filling the rectum. 2. Urethral injury likely at the membranous portion of the urethra, likely at the level of the UG diaphragm. Prostate cannot be assessed on the current exam and may be displaced by extensive inflammation in the pelvis. 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 3. Gas is present in the urinary bladder. 4. There is ascites along the left and right pericolic gutter. Volume of fluid similar to the recent comparison. Patient has suspected liver disease. Continued correlation with creatinine levels may be helpful. There is no extension of contrast into the peritoneal cavity on today's exam though the urinary bladder is not opacified and remains distended. Urethral injury and existing Foley catheter likely contribute to bladder outlet obstruction. 5. Extensive calcified atherosclerotic changes of the distal abdominal aorta.  Patient currently with temp of 99.3, WBC 9.9, hemoglobin 9.4, platelets 215k, creatinine 1.49.  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  Past Medical History:  Diagnosis Date  . Abscess    recurrent infections  . Arthritis   . Diabetes mellitus without complication ([place])   . Hypertension   . Obesity     Past Surgical History:  Procedure Laterality Date  . CYST EXCISION N/A 01/28/2016   Procedure: EXCISION SEBACEOUS CYST FOREHEAD X 2;  Surgeon: Donnie Mesa, MD;  Location: [place];  Service: General;  Laterality: N/A;  . CYST REMOVAL TRUNK Left 01/28/2016   Procedure: EXCISION SEBACEOUS CYST LEFT SHOULDER AND MID-BACK;  Surgeon: Donnie Mesa, MD;  Location: [place];  Service: General;  Laterality: Left;  . HYDRADENITIS EXCISION Right 01/28/2016   Procedure: EXCISION HIDRADENITIS RIGHT AXILLA ;  Surgeon: Donnie Mesa, MD;  Location: [place];  Service: General;  Laterality: Right;  . HYDRADENITIS EXCISION N/A 01/28/2016   Procedure: EXCISION HIDRADENITIS POSTERIOR NECK ;  Surgeon: Donnie Mesa, MD;  Location: [place];  Service: General;  Laterality: N/A;    Allergies: Other  Medications: Prior to Admission medications   Medication Sig Start Date End Date Taking? Authorizing Provider  allopurinol (ZYLOPRIM) 300 MG tablet Take 300 mg by mouth daily. 11/16/15  Yes [provider]  busPIRone (BUSPAR) 10 MG tablet Take 10 mg by mouth 3 (three) times daily. 09/10/19  Yes [provider]  clindamycin (CLINDAGEL) 1 % gel Apply 1 application topically See admin instructions. Apply to bumps on face and body 1-2 times daily as needed. 12/06/19  Yes [provider]  ferrous sulfate 325 (65 FE) MG tablet Take 325 mg by mouth daily.   Yes [provider]  gabapentin (NEURONTIN) 600 MG tablet Take 600 mg by mouth 3 (three) times daily. 12/31/19  Yes [provider]  IBU 800 MG tablet Take 800 mg by mouth every 8 (eight) hours. 12/31/19  Yes [provider]  Magnesium 200 MG TABS Take 200 mg by mouth daily.   Yes [provider]  metoprolol  succinate (TOPROL-XL) 25 MG 24 hr tablet Take 25 mg by mouth daily. 12/19/15  Yes [provider]  Milk Thistle Extract 175 MG TABS Take 175 mg by mouth daily.   Yes [provider]  Omega-3 1000 MG CAPS Take 1 capsule by mouth daily.   Yes [provider]  triamcinolone cream (KENALOG) 0.1 % Apply 1 application topically 2 (two) times daily as needed.  12/19/15  Yes [provider]  triamterene-hydrochlorothiazide (MAXZIDE-25) 37.5-25 MG tablet Take 1 tablet by mouth daily. 11/16/15  Yes [provider]  Vitamin D, Ergocalciferol, (DRISDOL) 1.25 MG (50000 UNIT) CAPS capsule Take 50,000 Units by mouth once a week. 12/31/19  Yes [provider]  amoxicillin (AMOXIL) 500 MG capsule Take 1 capsule (500 mg total) by mouth 3 (three) times daily. 01/21/20   Comer, Okey Regal, MD  amoxicillin (AMOXIL) 500 MG capsule Take 1 capsule (500 mg total) by mouth 3 (three) times daily. Do not fill until 02/17/20 01/21/20   Comer, Okey Regal, MD  blood glucose meter kit and supplies KIT Dispense based on patient and insurance preference. Use up to four times daily as directed. (FOR ICD-9 250.00, 250.01). 01/20/20   Antonieta Pert, MD  Insulin Pen Needle (PEN NEEDLES 3/16") 31G X 5 MM MISC USE WITH INSULIN PEN 01/20/20   Antonieta Pert, MD  tamsulosin (FLOMAX) 0.4 MG CAPS capsule Take 1 capsule (0.4 mg total) by mouth daily after supper. 01/20/20 02/19/20  Antonieta Pert, MD     Family History  Problem Relation Age of Onset  . Cancer Father     Social History   Socioeconomic History  . Marital status: Single    Spouse name: Not on file  . Number of children: Not on file  . Years of education: Not on file  . Highest education level: Not on file  Occupational History  . Not on file  Tobacco Use  . Smoking status: Current Every Day Smoker    Packs/day: 0.50    Types: Cigarettes  Substance and Sexual Activity  . Alcohol use: Yes    Comment: 40oz beer a day  fifth of liquor over   q 3-4 days  . Drug use: Yes    Frequency: 2.0 times per week    Types: Marijuana    Comment: 01/26/16  . Sexual activity: Not on file  Other Topics Concern  . Not on file  Social History Narrative  . Not on file   Social Determinants of Health   Financial Resource Strain:   . Difficulty of Paying Living Expenses:   Food Insecurity:   . Worried About Charity fundraiser in the Last Year:   . Arboriculturist in the Last Year:   Transportation Needs:   . Film/video editor (Medical):   [name] Lack of Transportation (Non-Medical):   Physical Activity:   . Days of Exercise per Week:   . Minutes of Exercise per Session:   Stress:   . Feeling of Stress :   Social Connections:   . Frequency of Communication with Friends and Family:   . Frequency of Social Gatherings with Friends and Family:   . Attends Religious Services:   . Active Member of Clubs or Organizations:   .  Attends Archivist Meetings:   [name] Marital Status:       Review of Systems currently denies fever, headache, chest pain, dyspnea, cough, back pain, nausea, vomiting.  He does have pelvic pressure.  Vital Signs: BP 109/66 (BP Location: Right Arm)   Pulse (!) 108   Temp 99.2 F (37.3 C) (Oral)   Resp 18   Ht '5\' 9"'$  (1.753 m)   Wt 298 lb 11.6 oz (135.5 kg)   SpO2 100%   BMI 44.11 kg/m   Physical Exam awake, alert.  Chest clear to auscultation bilaterally.  Heart with slightly tachycardic but regular rhythm.  Abdomen obese, soft, positive bowel sounds, some mild pelvic tenderness to palpation.  Extremities with full range of motion.  Imaging: DG Chest 2 View  Result Date: 01/14/2020 CLINICAL DATA:  Shortness of breath. EXAM: CHEST - 2 VIEW COMPARISON:  None. FINDINGS: The heart size and mediastinal contours are within normal limits. Both lungs are clear. No pneumothorax or pleural effusion is noted. The visualized skeletal structures are unremarkable. IMPRESSION: No active cardiopulmonary disease.  Electronically Signed   By: Marijo Conception M.D.   On: 01/14/2020 15:38   CT PELVIS WO CONTRAST  Result Date: 01/23/2020 CLINICAL DATA:  Pelvic trauma bloody stools with known urethral injury EXAM: CT PELVIS WITHOUT CONTRAST TECHNIQUE: Multidetector CT imaging of the pelvis was performed following the standard protocol without intravenous contrast. Water-soluble contrast was infused into the existing Foley catheter. COMPARISON:  01/22/2020 FINDINGS: Urinary Tract: Foley catheter is inflated at the pelvic floor in the bulbar urethra. Urethral injury is confirmed with contrast outlining extraperitoneal spaces of the pelvis, extending along the bladder base and filling the rectum. Gas is present in the urinary bladder. There is ascites along the left and right pericolic gutter. Volume of fluid similar to the recent comparison. Bowel: Vascular structures not well assessed given lack of intravenous contrast. Extensive calcified atherosclerotic changes of the distal abdominal aorta. No adenopathy in the pelvis. Reproductive: Prostate is not well evaluated. At the level of the mid prostate or suspected level of the mid prostate there contrast which extends outside of the area of the prostate extending around the expected location of the prostate as well, this raises the question of concomitant prosthetic urethral injury and injury to the prostate. This cannot be well delineated on the current study. Prostate could also be quite small and displaced to the right and anterior to the area of urethral injury. Other:  Ascites as above. Musculoskeletal: Spinal degenerative changes without acute or destructive bone process. IMPRESSION: 1. Urethral injury is confirmed with contrast outlining extraperitoneal spaces of the pelvis, extending along the bladder base, outlining and filling the rectum. 2. Urethral injury likely at the membranous portion of the urethra, likely at the level of the UG diaphragm. Prostate cannot be  assessed on the current exam and may be displaced by extensive inflammation in the pelvis. 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 3. Gas is present in the urinary bladder. 4. There is ascites along the left and right pericolic gutter. Volume of fluid similar to the recent comparison. Patient has suspected liver disease. Continued correlation with creatinine levels may be helpful. There is no extension of contrast into the peritoneal cavity on today's exam though the urinary bladder is not opacified and remains distended. Urethral injury and existing Foley catheter likely contribute to bladder outlet obstruction. 5. Extensive calcified atherosclerotic changes of the distal abdominal aorta. 6. A call is out to  the referring provider to further discuss findings in the above case. Aortic Atherosclerosis (ICD10-I70.0). Electronically Signed   By: Zetta Bills M.D.   On: 01/23/2020 12:49   US PELVIS LIMITED (TRANSABDOMINAL ONLY)  Result Date: 01/22/2020 CLINICAL DATA:  Foley catheter malpositioning EXAM: LIMITED ULTRASOUND OF PELVIS TECHNIQUE: Limited transabdominal ultrasound examination of the pelvis was performed. COMPARISON:  01/22/2020 FINDINGS: The urinary bladder is completely decompressed which limits evaluation. Transverse images demonstrate likely balloon from the Foley catheter within the decompressed bladder. IMPRESSION: 1. Severely limited study as the bladder is decompressed on this exam. Likely Foley catheter balloon within the decompressed bladder as above. If there is still concern for Foley catheter malpositioning, repeat CT of the pelvis could be considered. Electronically Signed   By: Randa Ngo M.D.   On: 01/22/2020 22:36   US RENAL  Result Date: 01/20/2020 CLINICAL DATA:  Acute kidney injury. EXAM: RENAL / URINARY TRACT ULTRASOUND COMPLETE COMPARISON:  Renal ultrasound dated January 15, 2020. FINDINGS: Right Kidney: Renal measurements: 14.1 x 7.1 x 6.1 cm = volume: 319 mL .  Echogenicity within normal limits. No mass visualized. New mild hydronephrosis. Left Kidney: Renal measurements: 15.2 x 8.2 x 8.3 cm = volume: 541 mL. Echogenicity within normal limits. No mass or hydronephrosis visualized. Bladder: Distended bladder containing a small amount of debris. The Foley catheter balloon is not identified within the bladder. Other: Small perihepatic ascites. IMPRESSION: 1. New mild right hydronephrosis. 2. Distended bladder without visualization of the Foley catheter balloon. Catheter repositioning recommended. Electronically Signed   By: Titus Dubin M.D.   On: 01/20/2020 19:02   US RENAL  Result Date: 01/15/2020 CLINICAL DATA:  Initial evaluation for urinary retention. EXAM: RENAL / URINARY TRACT ULTRASOUND COMPLETE COMPARISON:  None available. FINDINGS: Right Kidney: Renal measurements: 14.0 x 7.1 x 5.8 cm = volume: 271.5 mL. Echogenicity within normal limits. No nephrolithiasis or hydronephrosis. No focal renal mass. Left Kidney: Renal measurements: 15.8 x 7.9 x 4.8 cm = volume: 317.5 mL. Echogenicity within normal limits. No nephrolithiasis or hydronephrosis. No focal renal mass. Bladder: Decompressed with a Foley catheter in place. Other: None. IMPRESSION: Normal renal ultrasound. No hydronephrosis or other significant finding. Electronically Signed   By: Jeannine Boga M.D.   On: 01/15/2020 18:41   CT RENAL STONE STUDY  Result Date: 01/22/2020 CLINICAL DATA:  Inpatient. Bladder mass. Bladder pain/fullness. EXAM: CT ABDOMEN AND PELVIS WITHOUT CONTRAST TECHNIQUE: Multidetector CT imaging of the abdomen and pelvis was performed following the standard protocol without IV contrast. COMPARISON:  01/20/2020 renal sonogram. FINDINGS: Lower chest: Two peripheral left lower lobe pulmonary nodules, largest 6 mm (series 4/image 9). Coronary atherosclerosis. Hepatobiliary: Diffuse hepatic steatosis. Slight hypertrophy of the lateral segment left liver lobe. Questionable fine liver  surface irregularity, cannot exclude cirrhosis. No liver mass. Normal gallbladder with no radiopaque cholelithiasis. No biliary ductal dilatation. Pancreas: Normal, with no mass or duct dilation. Spleen: Normal size. No mass. Adrenals/Urinary Tract: Normal adrenals. No contour deforming renal masses. Mild bilateral hydroureteronephrosis to the level of the ureterovesical junctions bilaterally. No renal or ureteral stones. Prominently distended urinary bladder. Gas in the nondependent bladder. No bladder stones or wall thickening. Stomach/Bowel: Normal non-distended stomach. Normal caliber small bowel with no small bowel wall thickening. Normal appendix. Minimal left colonic diverticulosis with no definite large bowel wall thickening. Large bowel is largely collapsed. Vascular/Lymphatic: Atherosclerotic nonaneurysmal abdominal aorta. Small paraumbilical varix. Mild porta hepatis adenopathy up to 1.6 cm (series 3/image 31). No additional pathologically enlarged lymph nodes  in the abdomen or pelvis. Reproductive: Foley catheter is malpositioned with Foley balloon distended within the bulbar urethra, with the tip of the Foley within right prostatic peripheral zone (series 3/image 88), probably outside of the urethra. There is scattered soft tissue emphysema surrounding the Foley balloon. Prostate size appears normal. Other: No pneumoperitoneum. Small volume ascites. No focal fluid collection. Musculoskeletal: No aggressive appearing focal osseous lesions. Marked lower lumbar spondylosis. IMPRESSION: 1. Foley catheter is malpositioned with Foley balloon distended within the bulbar urethra, with the tip of the Foley within the right prostatic peripheral zone, probably outside of the urethra. Scattered soft tissue emphysema surrounding the Foley balloon. 2. Evidence of bladder outlet obstruction with prominently distended bladder and mild bilateral hydroureteronephrosis. No urolithiasis. 3. Diffuse hepatic steatosis.  Subtle morphologic changes in the liver suggestive of cirrhosis. No liver mass. Small volume ascites. Small paraumbilical varix. 4. Nonspecific mild porta hepatis adenopathy, potentially reactive. 5. Two left lower lobe pulmonary nodules, largest 6 mm. Non-contrast chest CT at 3-6 months is recommended. If the nodules are stable at time of repeat CT, then future CT at 18-24 months (from today's scan) is considered optional for low-risk patients, but is recommended for high-risk patients. This recommendation follows the consensus statement: Guidelines for Management of Incidental Pulmonary Nodules Detected on CT Images: From the Fleischner Society 2017; Radiology 2017; 284:228-243. 6. Coronary atherosclerosis. 7. Aortic Atherosclerosis (ICD10-I70.0). These results were called by telephone at the time of interpretation on 01/22/2020 at 3:04 pm to provider [facility] , who verbally acknowledged these results. Electronically Signed   By: Ilona Sorrel M.D.   On: 01/22/2020 15:24    Labs:  CBC: Recent Labs    01/20/20 0412 01/21/20 0523 01/22/20 0332 01/23/20 0420  WBC 11.1* 13.0* 8.7 9.9  HGB 9.5* 9.9* 9.3* 9.4*  HCT 26.7* 28.5* 26.9* 27.4*  PLT 154 167 140* 215    COAGS: No results for input(s): INR, APTT in the last 8760 hours.  BMP: Recent Labs    01/20/20 0412 01/21/20 0523 01/22/20 0332 01/23/20 0420  NA 139 138 137 136  K 3.3* 3.5 3.6 3.7  CL 111 111 110 109  CO2 16* 16* 15* 17*  GLUCOSE 107* 101* 104* 143*  BUN 36* 38* 35* 27*  CALCIUM 7.7* 8.2* 8.1* 8.4*  CREATININE 1.47* 1.66* 1.56* 1.49*  GFRNONAA 56* 49* 52* 55*  GFRAA >60 56* >60 >60    LIVER FUNCTION TESTS: Recent Labs    01/14/20 1930 01/17/20 1828 01/19/20 0203  BILITOT 3.3* 3.5* 3.7*  AST 29 24 35  ALT '28 23 24  '$ ALKPHOS 185* 127* 134*  PROT 8.9* 7.6 7.8  ALBUMIN 2.5* 1.6* 1.4*    TUMOR MARKERS: No results for input(s): AFPTM, CEA, CA199, CHROMGRNA in the last 8760 hours.  Assessment and  Plan: 44 y.o. male with PMH DM,HTN, obesity admitted to [facility]  01/14/20 with DKA/hematuria/urinary retention. Pt subsequently evaluated by urology because of poorly functioning foley cath/clotting and underwent exchange.  Patient now with very minimal bloody drainage from Foley catheter and also having some bloody rectal drainage since this a.m.  CT of the pelvis performed today revealed:  1. Urethral injury is confirmed with contrast outlining extraperitoneal spaces of the pelvis, extending along the bladder base, outlining and filling the rectum. 2. Urethral injury likely at the membranous portion of the urethra, likely at the level of the UG diaphragm. Prostate cannot be assessed on the current exam and may be displaced by extensive inflammation in the  pelvis. 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 3. Gas is present in the urinary bladder. 4. There is ascites along the left and right pericolic gutter. Volume of fluid similar to the recent comparison. Patient has suspected liver disease. Continued correlation with creatinine levels may be helpful. There is no extension of contrast into the peritoneal cavity on today's exam though the urinary bladder is not opacified and remains distended. Urethral injury and existing Foley catheter likely contribute to bladder outlet obstruction. 5. Extensive calcified atherosclerotic changes of the distal abdominal aorta.  Patient currently with temp of 99.3, WBC 9.9, hemoglobin 9.4, platelets 215k, creatinine 1.49.  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  Latest imaging studies have been reviewed by Dr. Anselm Pancoast.  Details/risks of procedure, including but not limited to, internal bleeding, infection, injury to adjacent structures discussed with patient and spouse with their understanding and consent.  Procedure scheduled for this evening as soon as possible.   Thank you for this  interesting consult.  I greatly enjoyed meeting Glenn Murphy and look forward to participating in their care.  A copy of this report was sent to the requesting provider on this date.  Electronically Signed: D. Rowe Robert, PA-C 01/23/2020, 3:58 PM   I spent a total of 25 minutes  in face to face in clinical consultation, greater than 50% of which was counseling/coordinating care for CT-guided suprapubic catheter placement

## 2020-01-23 NOTE — Progress Notes (Signed)
PROGRESS NOTE    Glenn Murphy  ZOX:[id number] DOB: 1976/06/27 DOA: 01/14/2020 PCP: Rogers Blocker, MD   Brief Narrative: 44 year old male with lifelong history of hidradenitis, diabetes mellitus, hypertension who Presented to the ED with complaint of thirst, hurting all over. lab results showed hyponatremia, metabolic acidosis, anion gap BS to be more than 8 blood sugar 734 creatinine 2.0 found to have AKI/DKA, anion gap metabolic acidosis and was admitted to ICU on the bicarb drip. Chest x-ray 4/6 clear lungs without pleural effusion, renal ultrasound 4/7 normal renal ultrasound no hydronephrosis.  Initially on vancomycin/Zosyn subsequently discontinued and briefly received ceftriaxone till 4/9.  Blood culture , 1 set of blood culture grows Actinomyces Naeslundii, urine cx negative.  Patient has been since weaned off insulin drip switched to long-acting and premeal insulin, off IV antibiotics and transferred to [facility] service 01/20/2020.   Patient with urinary retention, multiple instrumentation. 4/14, Foley catheter not working, malpositioned.  Multiple instrumentation at bedside.  Followed by urology.  Subjective:  Patient seen and examined.  He is tired with multiple instrumentation and lower abdomen pain since yesterday afternoon. Complains of suprapubic discomfort and some penile pain. Foley catheter draining blood. Also has blood through the rectum. Blood sugars are stable.   Assessment & Plan:  #1 DKA/anion gap metabolic acidosis/type 2 diabetes mellitus poorly controlled A1c at 11.2. DKA resolved. Off iv insulin.  Patient was a started on long-acting insulin and prandial insulin, blood sugars low normal.  Currently on decreased doses of insulin.  Continue to monitor while in the hospital.  Will titrate according to the blood sugars. Recent Labs  Lab 01/22/20 0730 01/22/20 1156 01/22/20 1702 01/22/20 1945 01/23/20 0752  GLUCAP 111* 91 119* 183* 110*   #2 urine  retention/urethral injury/traumatic catheterization with possible rectal injury: Patient had difficult Foley catheterization and false passages. Followed by urology. Probable suprapubic catheter placement today.  Further management as per urology.  AKI, baseline creatinine 0.9 on 01/2016-unclear if progressive CKD in the setting of diabetes/hypertension: Creatinine downtrending.  We will continue to monitor.  Recent Labs  Lab 01/19/20 0911 01/20/20 0412 01/21/20 0523 01/22/20 0332 01/23/20 0420  BUN 42* 36* 38* 35* 27*  CREATININE 1.70* 1.47* 1.66* 1.56* 1.49*   Essential hypertension/sinus tachycardia:  BP stable. Home metoprolol and Maxide are on hold.    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 SEPSIS POA- now resolved.  Patient did receive broad spectrum antibiotic in [place] known hidradenitis suppurativa consistent with superinfection of his skin-discussed/consult infectious disease and now placed on amoxicillin to be continued for next 3 to 58-month  Chronic hidradenitis, wound care on consult and hidradenitis lesions to bilateral upper back, continue wound care.  Morbid obesity with body mass index is 43.01 kg/m: Will benefit with weight loss and healthy lifestyle.  DVT prophylaxis: Heparin, holding today. Code Status: full  Family Communication: plan of care discussed with patient and wife at bedside.  Status is: Inpatient Remains inpatient appropriate because acute renal failure, management of his diabetes and titration of his insulin Dispo: The patient is from: Home              Anticipated d/c is to: Home w HH PT, Rolling walker with 5" wheels;3in1, RN               Anticipated d/c date is: 1-2 days               Patient currently is not  medically stable to d/c.  Urinary retention/hematuria hematochezia. May need to go to surgery. Nutrition: Diet Order            Diet NPO time specified  Diet  effective now        Diet Carb Modified            Consultants: PCCM, Urology Procedures:see note Microbiology:see note  Medications: Scheduled Meds: . amoxicillin  500 mg Oral Q8H  . Chlorhexidine Gluconate Cloth  6 each Topical Daily  . insulin aspart  0-15 Units Subcutaneous TID WC  . insulin aspart  0-5 Units Subcutaneous QHS  . insulin aspart  4 Units Subcutaneous TID WC  . insulin detemir  10 Units Subcutaneous BID  . insulin starter kit- pen needles  1 kit Other Once  . mouth rinse  15 mL Mouth Rinse BID  . melatonin  3 mg Oral QHS  . sodium chloride flush  10-40 mL Intracatheter Q12H  . sodium chloride flush  3 mL Intravenous Once  . tamsulosin  0.4 mg Oral QPC supper   Continuous Infusions: . lactated ringers 100 mL/hr at 01/23/20 5035    Antimicrobials: Anti-infectives (From admission, onward)   Start     Dose/Rate Route Frequency Ordered Stop   01/21/20 0930  amoxicillin (AMOXIL) capsule 500 mg     500 mg Oral Every 8 hours 01/21/20 0924     01/21/20 0000  amoxicillin (AMOXIL) 500 MG capsule     500 mg Oral 3 times daily 01/21/20 0931     01/21/20 0000  amoxicillin (AMOXIL) 500 MG capsule     500 mg Oral 3 times daily 01/21/20 1042     01/15/20 2200  vancomycin (VANCOREADY) IVPB 1500 mg/300 mL  Status:  Discontinued     1,500 mg 150 mL/hr over 120 Minutes Intravenous Every 24 hours 01/14/20 2154 01/15/20 1019   01/15/20 1500  cefTRIAXone (ROCEPHIN) 2 g in sodium chloride 0.9 % 100 mL IVPB  Status:  Discontinued     2 g 200 mL/hr over 30 Minutes Intravenous Every 24 hours 01/15/20 1019 01/17/20 1114   01/15/20 0400  piperacillin-tazobactam (ZOSYN) IVPB 3.375 g  Status:  Discontinued     3.375 g 12.5 mL/hr over 240 Minutes Intravenous Every 8 hours 01/14/20 2154 01/15/20 1019   01/14/20 2200  piperacillin-tazobactam (ZOSYN) IVPB 3.375 g     3.375 g 100 mL/hr over 30 Minutes Intravenous  Once 01/14/20 2154 01/14/20 2339   01/14/20 2200  vancomycin (VANCOCIN)  2,500 mg in sodium chloride 0.9 % 500 mL IVPB     2,500 mg 250 mL/hr over 120 Minutes Intravenous  Once 01/14/20 2154 01/15/20 0402       Objective: Vitals: Today's Vitals   01/23/20 0503 01/23/20 0509 01/23/20 0727 01/23/20 0853  BP: 101/64 101/64    Pulse: (!) 108 (!) 108    Resp: 18 18    Temp: 97.9 F (36.6 C) 97.9 F (36.6 C)    TempSrc: Oral Oral    SpO2:  100%    Weight:  135.5 kg    Height:      PainSc:   9  7     Intake/Output Summary (Last 24 hours) at 01/23/2020 1110 Last data filed at 01/23/2020 0900 Gross per 24 hour  Intake 0 ml  Output 800 ml  Net -800 ml   Filed Weights   01/18/20 0453 01/21/20 0500 01/23/20 0509  Weight: 132.1 kg 132.5 kg 135.5 kg   Weight change:  Intake/Output from previous day: 04/14 0701 - 04/15 0700 In: -  Out: 800 [Urine:800] Intake/Output this shift: No intake/output data recorded.  Examination:  Physical Exam  Constitutional: He is oriented to person, place, and time. He appears well-developed.  Patient is sleepy after pain medicine.  He is tired all night.  Chronically sick looking.  Not in any distress.  On room air.  HENT:  Head: Normocephalic and atraumatic.  Eyes: Pupils are equal, round, and reactive to light.  Cardiovascular: Normal rate and regular rhythm.  Respiratory: Effort normal.  GI: Soft.  Obese and pendulous.  Moderate tenderness along the suprapubic area.  No rigidity or guarding. Foley catheter with blood and minimal urine.  Musculoskeletal:     Cervical back: Normal range of motion.  Neurological: He is alert and oriented to person, place, and time.    Data Reviewed: I have personally reviewed following labs and imaging studies CBC: Recent Labs  Lab 01/19/20 0203 01/20/20 0412 01/21/20 0523 01/22/20 0332 01/23/20 0420  WBC 8.6 11.1* 13.0* 8.7 9.9  NEUTROABS 6.1 8.0* 10.1* 6.2 8.6*  HGB 10.3* 9.5* 9.9* 9.3* 9.4*  HCT 28.1* 26.7* 28.5* 26.9* 27.4*  MCV 89.8 90.5 93.8 93.4 95.8  PLT  115* 154 167 140* 654   Basic Metabolic Panel: Recent Labs  Lab 01/16/20 1417 01/16/20 1417 01/17/20 0011 01/17/20 0809 01/19/20 0203 01/19/20 0203 01/19/20 0911 01/19/20 0911 01/19/20 1633 01/20/20 0412 01/21/20 0523 01/22/20 0332 01/23/20 0420  NA 131*   < > 127*   < > 137   < > 137  --   --  139 138 137 136  K 2.9*   < > 3.4*   < > 3.0*   < > 3.7   < > 3.4* 3.3* 3.5 3.6 3.7  CL 104   < > 104   < > 110   < > 110  --   --  111 111 110 109  CO2 9*   < > 7*   < > 15*   < > 14*  --   --  16* 16* 15* 17*  GLUCOSE 227*   < > 247*   < > 214*   < > 225*  --   --  107* 101* 104* 143*  BUN 64*   < > 61*   < > 44*   < > 42*  --   --  36* 38* 35* 27*  CREATININE 2.41*   < > 2.40*   < > 1.84*   < > 1.70*  --   --  1.47* 1.66* 1.56* 1.49*  CALCIUM 8.1*   < > 8.0*   < > 7.5*   < > 7.3*  --   --  7.7* 8.2* 8.1* 8.4*  MG  --   --  2.0  --  2.1  --   --   --   --   --   --   --  1.9  PHOS 1.1*  --  1.9*  --  2.8  --   --   --   --   --   --   --  4.5   < > = values in this interval not displayed.   GFR: Estimated Creatinine Clearance: 84.6 mL/min (A) (by C-G formula based on SCr of 1.49 mg/dL (H)). Liver Function Tests: Recent Labs  Lab 01/17/20 1828 01/19/20 0203  AST 24 35  ALT 23 24  ALKPHOS 127* 134*  BILITOT 3.5* 3.7*  PROT 7.6 7.8  ALBUMIN 1.6* 1.4*   No results for input(s): LIPASE, AMYLASE in the last 168 hours. No results for input(s): AMMONIA in the last 168 hours. Coagulation Profile: No results for input(s): INR, PROTIME in the last 168 hours. Cardiac Enzymes: Recent Labs  Lab 01/16/20 1856  CKTOTAL 66   BNP (last 3 results) No results for input(s): PROBNP in the last 8760 hours. HbA1C: No results for input(s): HGBA1C in the last 72 hours. CBG: Recent Labs  Lab 01/22/20 0730 01/22/20 1156 01/22/20 1702 01/22/20 1945 01/23/20 0752  GLUCAP 111* 91 119* 183* 110*   Lipid Profile: No results for input(s): CHOL, HDL, LDLCALC, TRIG, CHOLHDL, LDLDIRECT in the  last 72 hours. Thyroid Function Tests: No results for input(s): TSH, T4TOTAL, FREET4, T3FREE, THYROIDAB in the last 72 hours. Anemia Panel: No results for input(s): VITAMINB12, FOLATE, FERRITIN, TIBC, IRON, RETICCTPCT in the last 72 hours. Sepsis Labs: No results for input(s): PROCALCITON, LATICACIDVEN in the last 168 hours.  Recent Results (from the past 240 hour(s))  Respiratory Panel by RT PCR (Flu A&B, Covid) - Nasopharyngeal Swab     Status: None   Collection Time: 01/14/20  8:22 PM   Specimen: Nasopharyngeal Swab  Result Value Ref Range Status   SARS Coronavirus 2 by RT PCR NEGATIVE NEGATIVE Final    Comment: (NOTE) SARS-CoV-2 target nucleic acids are NOT DETECTED. The SARS-CoV-2 RNA is generally detectable in upper respiratoy specimens during the acute phase of infection. The lowest concentration of SARS-CoV-2 viral copies this assay can detect is 131 copies/mL. A negative result does not preclude SARS-Cov-2 infection and should not be used as the sole basis for treatment or other patient management decisions. A negative result may occur with  improper specimen collection/handling, submission of specimen other than nasopharyngeal swab, presence of viral mutation(s) within the areas targeted by this assay, and inadequate number of viral copies (<131 copies/mL). A negative result must be combined with clinical observations, patient history, and epidemiological information. The expected result is Negative. Fact Sheet for Patients:  PinkCheek.be Fact Sheet for Healthcare Providers:  GravelBags.it This test is not yet ap proved or cleared by the [place] FDA and  has been authorized for detection and/or diagnosis of SARS-CoV-2 by FDA under an Emergency Use Authorization (EUA). This EUA will remain  in effect (meaning this test can be used) for the duration of the COVID-19 declaration under Section 564(b)(1) of the  Act, 21 U.S.C. section 360bbb-3(b)(1), unless the authorization is terminated or revoked sooner.    Influenza A by PCR NEGATIVE NEGATIVE Final   Influenza B by PCR NEGATIVE NEGATIVE Final    Comment: (NOTE) The Xpert Xpress SARS-CoV-2/FLU/RSV assay is intended as an aid in  the diagnosis of influenza from Nasopharyngeal swab specimens and  should not be used as a sole basis for treatment. Nasal washings and  aspirates are unacceptable for Xpert Xpress SARS-CoV-2/FLU/RSV  testing. Fact Sheet for Patients: PinkCheek.be Fact Sheet for Healthcare Providers: GravelBags.it This test is not yet approved or cleared by the [place] FDA and  has been authorized for detection and/or diagnosis of SARS-CoV-2 by  FDA under an Emergency Use Authorization (EUA). This EUA will remain  in effect (meaning this test can be used) for the duration of the  Covid-19 declaration under Section 564(b)(1) of the Act, 21  U.S.C. section 360bbb-3(b)(1), unless the authorization is  terminated or revoked. Performed at [facility], [place] [address] [place], [place] [address]  Culture, blood (routine x 2)     Status: Abnormal   Collection Time: 01/14/20  9:57 PM   Specimen: BLOOD LEFT HAND  Result Value Ref Range Status   Specimen Description BLOOD LEFT HAND  Final   Special Requests   Final    BOTTLES DRAWN AEROBIC AND ANAEROBIC Blood Culture results may not be optimal due to an inadequate volume of blood received in culture bottles   Culture  Setup Time   Final    GRAM POSITIVE RODS ANAEROBIC BOTTLE ONLY CRITICAL RESULT CALLED TO, READ BACK BY AND VERIFIED WITH: G. ABBOTT,PHARMD [id number] 01/17/2020 T. TYSOR    Culture (A)  Final    ACTINOMYCES NAESLUNDII Standardized susceptibility testing for this organism is not available. Performed at [facility], [place] [address], [place], [place] [address]    Report Status 01/19/2020 FINAL  Final    Culture, blood (routine x 2)     Status: None   Collection Time: 01/14/20 10:05 PM   Specimen: BLOOD  Result Value Ref Range Status   Specimen Description BLOOD LEFT ANTECUBITAL  Final   Special Requests   Final    BOTTLES DRAWN AEROBIC ONLY Blood Culture results may not be optimal due to an inadequate volume of blood received in culture bottles   Culture   Final    NO GROWTH 5 DAYS Performed at [facility], [place] [address], [place], [place] [address]    Report Status 01/19/2020 FINAL  Final  MRSA PCR Screening     Status: None   Collection Time: 01/15/20  1:05 AM   Specimen: Nasal Mucosa; Nasopharyngeal  Result Value Ref Range Status   MRSA by PCR NEGATIVE NEGATIVE Final    Comment:        The GeneXpert MRSA Assay (FDA approved for NASAL specimens only), is one component of a comprehensive MRSA colonization surveillance program. It is not intended to diagnose MRSA infection nor to guide or monitor treatment for MRSA infections. Performed at [facility], [place] [address], [place], [place] [address]   Urine culture     Status: None   Collection Time: 01/15/20  2:19 AM   Specimen: Urine, Random  Result Value Ref Range Status   Specimen Description URINE, RANDOM  Final   Special Requests NONE  Final   Culture   Final    NO GROWTH Performed at [facility], [place] [address], [place], [place] [address]    Report Status 01/16/2020 FINAL  Final      Radiology Studies: US PELVIS LIMITED (TRANSABDOMINAL ONLY)  Result Date: 01/22/2020 CLINICAL DATA:  Foley catheter malpositioning EXAM: LIMITED ULTRASOUND OF PELVIS TECHNIQUE: Limited transabdominal ultrasound examination of the pelvis was performed. COMPARISON:  01/22/2020 FINDINGS: The urinary bladder is completely decompressed which limits evaluation. Transverse images demonstrate likely balloon from the Foley catheter within the decompressed bladder. IMPRESSION: 1. Severely limited study as the bladder is  decompressed on this exam. Likely Foley catheter balloon within the decompressed bladder as above. If there is still concern for Foley catheter malpositioning, repeat CT of the pelvis could be considered. Electronically Signed   By: Randa Ngo M.D.   On: 01/22/2020 22:36   CT RENAL STONE STUDY  Result Date: 01/22/2020 CLINICAL DATA:  Inpatient. Bladder mass. Bladder pain/fullness. EXAM: CT ABDOMEN AND PELVIS WITHOUT CONTRAST TECHNIQUE: Multidetector CT imaging of the abdomen and pelvis was performed following the standard protocol without IV contrast. COMPARISON:  01/20/2020 renal sonogram. FINDINGS:  Lower chest: Two peripheral left lower lobe pulmonary nodules, largest 6 mm (series 4/image 9). Coronary atherosclerosis. Hepatobiliary: Diffuse hepatic steatosis. Slight hypertrophy of the lateral segment left liver lobe. Questionable fine liver surface irregularity, cannot exclude cirrhosis. No liver mass. Normal gallbladder with no radiopaque cholelithiasis. No biliary ductal dilatation. Pancreas: Normal, with no mass or duct dilation. Spleen: Normal size. No mass. Adrenals/Urinary Tract: Normal adrenals. No contour deforming renal masses. Mild bilateral hydroureteronephrosis to the level of the ureterovesical junctions bilaterally. No renal or ureteral stones. Prominently distended urinary bladder. Gas in the nondependent bladder. No bladder stones or wall thickening. Stomach/Bowel: Normal non-distended stomach. Normal caliber small bowel with no small bowel wall thickening. Normal appendix. Minimal left colonic diverticulosis with no definite large bowel wall thickening. Large bowel is largely collapsed. Vascular/Lymphatic: Atherosclerotic nonaneurysmal abdominal aorta. Small paraumbilical varix. Mild porta hepatis adenopathy up to 1.6 cm (series 3/image 31). No additional pathologically enlarged lymph nodes in the abdomen or pelvis. Reproductive: Foley catheter is malpositioned with Foley balloon distended  within the bulbar urethra, with the tip of the Foley within right prostatic peripheral zone (series 3/image 88), probably outside of the urethra. There is scattered soft tissue emphysema surrounding the Foley balloon. Prostate size appears normal. Other: No pneumoperitoneum. Small volume ascites. No focal fluid collection. Musculoskeletal: No aggressive appearing focal osseous lesions. Marked lower lumbar spondylosis. IMPRESSION: 1. Foley catheter is malpositioned with Foley balloon distended within the bulbar urethra, with the tip of the Foley within the right prostatic peripheral zone, probably outside of the urethra. Scattered soft tissue emphysema surrounding the Foley balloon. 2. Evidence of bladder outlet obstruction with prominently distended bladder and mild bilateral hydroureteronephrosis. No urolithiasis. 3. Diffuse hepatic steatosis. Subtle morphologic changes in the liver suggestive of cirrhosis. No liver mass. Small volume ascites. Small paraumbilical varix. 4. Nonspecific mild porta hepatis adenopathy, potentially reactive. 5. Two left lower lobe pulmonary nodules, largest 6 mm. Non-contrast chest CT at 3-6 months is recommended. If the nodules are stable at time of repeat CT, then future CT at 18-24 months (from today's scan) is considered optional for low-risk patients, but is recommended for high-risk patients. This recommendation follows the consensus statement: Guidelines for Management of Incidental Pulmonary Nodules Detected on CT Images: From the Fleischner Society 2017; Radiology 2017; 284:228-243. 6. Coronary atherosclerosis. 7. Aortic Atherosclerosis (ICD10-I70.0). These results were called by telephone at the time of interpretation on 01/22/2020 at 3:04 pm to provider [facility] , who verbally acknowledged these results. Electronically Signed   By: Ilona Sorrel M.D.   On: 01/22/2020 15:24     LOS: 9 days    Total time spent: 30 minutes   [name], MD [facility]  01/23/2020, 11:10 AM

## 2020-01-23 NOTE — Progress Notes (Signed)
Subjective: Pt has had thin bloody rectal drainage since early am. No pain.  Objective: Vital signs in last 24 hours: Temp:  [97.9 F (36.6 C)-98.4 F (36.9 C)] 97.9 F (36.6 C) (04/15 0509) Pulse Rate:  [102-108] 108 (04/15 0509) Resp:  [16-19] 18 (04/15 0509) BP: (101-124)/(64-92) 101/64 (04/15 0509) SpO2:  [100 %] 100 % (04/15 0509) Weight:  [135.5 kg] 135.5 kg (04/15 0509)  Intake/Output from previous day: 04/14 0701 - 04/15 0700 In: -  Out: 800 [Urine:800] Intake/Output this shift: No intake/output data recorded.  Physical Exam:  Constitutional: Vital signs reviewed. WD WN in NAD   Eyes: PERRL, No scleral icterus.   Cardiovascular: RRR Pulmonary/Chest: Normal effort Extremities: No cyanosis or edema   Lab Results: Recent Labs    01/21/20 0523 01/22/20 0332 01/23/20 0420  HGB 9.9* 9.3* 9.4*  HCT 28.5* 26.9* 27.4*   BMET Recent Labs    01/22/20 0332 01/23/20 0420  NA 137 136  K 3.6 3.7  CL 110 109  CO2 15* 17*  GLUCOSE 104* 143*  BUN 35* 27*  CREATININE 1.56* 1.49*  CALCIUM 8.1* 8.4*   No results for input(s): LABPT, INR in the last 72 hours. No results for input(s): LABURIN in the last 72 hours. Results for orders placed or performed during the hospital encounter of 01/14/20  Respiratory Panel by RT PCR (Flu A&B, Covid) - Nasopharyngeal Swab     Status: None   Collection Time: 01/14/20  8:22 PM   Specimen: Nasopharyngeal Swab  Result Value Ref Range Status   SARS Coronavirus 2 by RT PCR NEGATIVE NEGATIVE Final    Comment: (NOTE) SARS-CoV-2 target nucleic acids are NOT DETECTED. The SARS-CoV-2 RNA is generally detectable in upper respiratoy specimens during the acute phase of infection. The lowest concentration of SARS-CoV-2 viral copies this assay can detect is 131 copies/mL. A negative result does not preclude SARS-Cov-2 infection and should not be used as the sole basis for treatment or other patient management decisions. A negative result  may occur with  improper specimen collection/handling, submission of specimen other than nasopharyngeal swab, presence of viral mutation(s) within the areas targeted by this assay, and inadequate number of viral copies (<131 copies/mL). A negative result must be combined with clinical observations, patient history, and epidemiological information. The expected result is Negative. Fact Sheet for Patients:  https://www.moore.com/ Fact Sheet for Healthcare Providers:  https://www.young.biz/ This test is not yet ap proved or cleared by the [place] FDA and  has been authorized for detection and/or diagnosis of SARS-CoV-2 by FDA under an Emergency Use Authorization (EUA). This EUA will remain  in effect (meaning this test can be used) for the duration of the COVID-19 declaration under Section 564(b)(1) of the Act, 21 U.S.C. section 360bbb-3(b)(1), unless the authorization is terminated or revoked sooner.    Influenza A by PCR NEGATIVE NEGATIVE Final   Influenza B by PCR NEGATIVE NEGATIVE Final    Comment: (NOTE) The Xpert Xpress SARS-CoV-2/FLU/RSV assay is intended as an aid in  the diagnosis of influenza from Nasopharyngeal swab specimens and  should not be used as a sole basis for treatment. Nasal washings and  aspirates are unacceptable for Xpert Xpress SARS-CoV-2/FLU/RSV  testing. Fact Sheet for Patients: https://www.moore.com/ Fact Sheet for Healthcare Providers: https://www.young.biz/ This test is not yet approved or cleared by the [place] FDA and  has been authorized for detection and/or diagnosis of SARS-CoV-2 by  FDA under an Emergency Use Authorization (EUA). This EUA will remain  in effect (meaning this test can be used) for the duration of the  Covid-19 declaration under Section 564(b)(1) of the Act, 21  U.S.C. section 360bbb-3(b)(1), unless the authorization is  terminated or  revoked. Performed at [facility], [address], [place], [place] [address]   Culture, blood (routine x 2)     Status: Abnormal   Collection Time: 01/14/20  9:57 PM   Specimen: BLOOD LEFT HAND  Result Value Ref Range Status   Specimen Description BLOOD LEFT HAND  Final   Special Requests   Final    BOTTLES DRAWN AEROBIC AND ANAEROBIC Blood Culture results may not be optimal due to an inadequate volume of blood received in culture bottles   Culture  Setup Time   Final    GRAM POSITIVE RODS ANAEROBIC BOTTLE ONLY CRITICAL RESULT CALLED TO, READ BACK BY AND VERIFIED WITH: G. ABBOTT,PHARMD [id number] 01/17/2020 T. TYSOR    Culture (A)  Final    ACTINOMYCES NAESLUNDII Standardized susceptibility testing for this organism is not available. Performed at [facility], [address], [place], [place] [address]    Report Status 01/19/2020 FINAL  Final  Culture, blood (routine x 2)     Status: None   Collection Time: 01/14/20 10:05 PM   Specimen: BLOOD  Result Value Ref Range Status   Specimen Description BLOOD LEFT ANTECUBITAL  Final   Special Requests   Final    BOTTLES DRAWN AEROBIC ONLY Blood Culture results may not be optimal due to an inadequate volume of blood received in culture bottles   Culture   Final    NO GROWTH 5 DAYS Performed at [facility], [address], [place], [place] [address]    Report Status 01/19/2020 FINAL  Final  MRSA PCR Screening     Status: None   Collection Time: 01/15/20  1:05 AM   Specimen: Nasal Mucosa; Nasopharyngeal  Result Value Ref Range Status   MRSA by PCR NEGATIVE NEGATIVE Final    Comment:        The GeneXpert MRSA Assay (FDA approved for NASAL specimens only), is one component of a comprehensive MRSA colonization surveillance program. It is not intended to diagnose MRSA infection nor to guide or monitor treatment for MRSA infections. Performed at [facility], [address] [place], [place] [address]   Urine  culture     Status: None   Collection Time: 01/15/20  2:19 AM   Specimen: Urine, Random  Result Value Ref Range Status   Specimen Description URINE, RANDOM  Final   Special Requests NONE  Final   Culture   Final    NO GROWTH Performed at [facility], [address] [place], [place] [address]    Report Status 01/16/2020 FINAL  Final    Studies/Results: US PELVIS LIMITED (TRANSABDOMINAL ONLY)  Result Date: 01/22/2020 CLINICAL DATA:  Foley catheter malpositioning EXAM: LIMITED ULTRASOUND OF PELVIS TECHNIQUE: Limited transabdominal ultrasound examination of the pelvis was performed. COMPARISON:  01/22/2020 FINDINGS: The urinary bladder is completely decompressed which limits evaluation. Transverse images demonstrate likely balloon from the Foley catheter within the decompressed bladder. IMPRESSION: 1. Severely limited study as the bladder is decompressed on this exam. Likely Foley catheter balloon within the decompressed bladder as above. If there is still concern for Foley catheter malpositioning, repeat CT of the pelvis could be considered. Electronically Signed   By: Sharlet Salina M.D.   On: 01/22/2020 22:36   CT  RENAL STONE STUDY  Result Date: 01/22/2020 CLINICAL DATA:  Inpatient. Bladder mass. Bladder pain/fullness. EXAM: CT ABDOMEN AND PELVIS WITHOUT CONTRAST TECHNIQUE: Multidetector CT imaging of the abdomen and pelvis was performed following the standard protocol without IV contrast. COMPARISON:  01/20/2020 renal sonogram. FINDINGS: Lower chest: Two peripheral left lower lobe pulmonary nodules, largest 6 mm (series 4/image 9). Coronary atherosclerosis. Hepatobiliary: Diffuse hepatic steatosis. Slight hypertrophy of the lateral segment left liver lobe. Questionable fine liver surface irregularity, cannot exclude cirrhosis. No liver mass. Normal gallbladder with no radiopaque cholelithiasis. No biliary ductal dilatation. Pancreas: Normal, with no mass or duct dilation. Spleen: Normal  size. No mass. Adrenals/Urinary Tract: Normal adrenals. No contour deforming renal masses. Mild bilateral hydroureteronephrosis to the level of the ureterovesical junctions bilaterally. No renal or ureteral stones. Prominently distended urinary bladder. Gas in the nondependent bladder. No bladder stones or wall thickening. Stomach/Bowel: Normal non-distended stomach. Normal caliber small bowel with no small bowel wall thickening. Normal appendix. Minimal left colonic diverticulosis with no definite large bowel wall thickening. Large bowel is largely collapsed. Vascular/Lymphatic: Atherosclerotic nonaneurysmal abdominal aorta. Small paraumbilical varix. Mild porta hepatis adenopathy up to 1.6 cm (series 3/image 31). No additional pathologically enlarged lymph nodes in the abdomen or pelvis. Reproductive: Foley catheter is malpositioned with Foley balloon distended within the bulbar urethra, with the tip of the Foley within right prostatic peripheral zone (series 3/image 88), probably outside of the urethra. There is scattered soft tissue emphysema surrounding the Foley balloon. Prostate size appears normal. Other: No pneumoperitoneum. Small volume ascites. No focal fluid collection. Musculoskeletal: No aggressive appearing focal osseous lesions. Marked lower lumbar spondylosis. IMPRESSION: 1. Foley catheter is malpositioned with Foley balloon distended within the bulbar urethra, with the tip of the Foley within the right prostatic peripheral zone, probably outside of the urethra. Scattered soft tissue emphysema surrounding the Foley balloon. 2. Evidence of bladder outlet obstruction with prominently distended bladder and mild bilateral hydroureteronephrosis. No urolithiasis. 3. Diffuse hepatic steatosis. Subtle morphologic changes in the liver suggestive of cirrhosis. No liver mass. Small volume ascites. Small paraumbilical varix. 4. Nonspecific mild porta hepatis adenopathy, potentially reactive. 5. Two left lower  lobe pulmonary nodules, largest 6 mm. Non-contrast chest CT at 3-6 months is recommended. If the nodules are stable at time of repeat CT, then future CT at 18-24 months (from today's scan) is considered optional for low-risk patients, but is recommended for high-risk patients. This recommendation follows the consensus statement: Guidelines for Management of Incidental Pulmonary Nodules Detected on CT Images: From the Fleischner Society 2017; Radiology 2017; 284:228-243. 6. Coronary atherosclerosis. 7. Aortic Atherosclerosis (ICD10-I70.0). These results were called by telephone at the time of interpretation on 01/22/2020 at 3:04 pm to provider [facility] , who verbally acknowledged these results. Electronically Signed   By: Ilona Sorrel M.D.   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 I discussed this w/ pt and wife. Will proceed with CT cystogram. If relatively small process will proceed w/ sp tube. If large may need sp tube as well as diverting colostomy   LOS: 9 days   Jorja Loa 01/23/2020, 10:09 AM

## 2020-01-23 NOTE — Progress Notes (Signed)
Physical Therapy Treatment Patient Details Name: Glenn Murphy MRN: [id number] DOB: 1975-11-13 Today's Date: 01/23/2020    History of Present Illness Patient is a 44 year old male with lifelong history of hidradenitis brought to the emergency room with hyperglycemia, elevated BHB and anion gap metabolic acidosis.    PT Comments    Pt reporting pain "in my bones" upon arrival to room, but agreeable to OOB mobility for clean linens as pt with bloody urine all over bed due to leaking catheter and rectum. Pt required min guard to min assist for bed mobility, transfer OOB, and short distance ambulation in room with use of RW. Pt's wife present for session, and is very encouraging to pt. PT continuing to recommend HHPT, will continue to follow acutely and progress mobility as tolerated.    Follow Up Recommendations  Home health PT     Equipment Recommendations  Rolling walker with 5" wheels;3in1 (PT)(delivered)    Recommendations for Other Services       Precautions / Restrictions Precautions Precautions: Fall Restrictions Weight Bearing Restrictions: No    Mobility  Bed Mobility Overal bed mobility: Needs Assistance Bed Mobility: Supine to Sit     Supine to sit: HOB elevated;Min guard Sit to supine: HOB elevated;Mod assist   General bed mobility comments: min guard for supine to sit for increased time and use of bedrails, mod assist for return to supine for LE lifting into bed and lines/leads management.  Transfers Overall transfer level: Needs assistance Equipment used: Rolling walker (2 wheeled) Transfers: Sit to/from Stand Sit to Stand: +2 safety/equipment;Mod assist         General transfer comment: mod assist for power up, steadying, verbal cuing for safe hand placement when rising.  Ambulation/Gait Ambulation/Gait assistance: Min assist Gait Distance (Feet): 5 Feet(2x5 ft, to and from recliner) Assistive device: Rolling walker (2 wheeled) Gait  Pattern/deviations: Step-through pattern;Decreased stride length;Trunk flexed Gait velocity: decr   General Gait Details: min assist to steady, guide pt and RW to from recliner. Verbal cuing for upright posture.   Stairs             Wheelchair Mobility    Modified Rankin (Stroke Patients Only)       Balance Overall balance assessment: Needs assistance Sitting-balance support: Feet supported Sitting balance-Leahy Scale: Fair Sitting balance - Comments: sat EOB x15 minutes (10 minutes with PT, 5 minutes talking to MD), with intermittent propping on R elbow to rest     Standing balance-Leahy Scale: Poor Standing balance comment: reliant on external support                            Cognition Arousal/Alertness: Awake/alert Behavior During Therapy: WFL for tasks assessed/performed Overall Cognitive Status: Difficult to assess                                 General Comments: follows commands well, requires increased time for mobilizing      Exercises      General Comments General comments (skin integrity, edema, etc.): bloody urine leaking from foley catheter and rectum throughout mobility, MD and RN aware      Pertinent Vitals/Pain Pain Assessment: Faces Faces Pain Scale: Hurts even more Pain Location: back, "my bones" Pain Descriptors / Indicators: Sore;Discomfort Pain Intervention(s): Limited activity within patient's tolerance;Monitored during session;Repositioned    Home Living  Prior Function            PT Goals (current goals can now be found in the care plan section) Acute Rehab PT Goals Patient Stated Goal: decrease pain PT Goal Formulation: With patient Time For Goal Achievement: 02/02/20 Potential to Achieve Goals: Good Progress towards PT goals: Progressing toward goals    Frequency    Min 3X/week      PT Plan Current plan remains appropriate    Co-evaluation               AM-PAC PT "6 Clicks" Mobility   Outcome Measure  Help needed turning from your back to your side while in a flat bed without using bedrails?: A Little Help needed moving from lying on your back to sitting on the side of a flat bed without using bedrails?: A Little Help needed moving to and from a bed to a chair (including a wheelchair)?: A Little Help needed standing up from a chair using your arms (e.g., wheelchair or bedside chair)?: A Little Help needed to walk in hospital room?: A Little Help needed climbing 3-5 steps with a railing? : A Lot 6 Click Score: 17    End of Session Equipment Utilized During Treatment: Gait belt Activity Tolerance: Patient tolerated treatment well Patient left: with family/visitor present;in bed;with bed alarm set;with call bell/phone within reach Nurse Communication: Mobility status PT Visit Diagnosis: Unsteadiness on feet (R26.81);Other abnormalities of gait and mobility (R26.89);Muscle weakness (generalized) (M62.81)     Time: 4010-2725(36 minutes not included in billable treatment time, MD present and talking to pt) PT Time Calculation (min) (ACUTE ONLY): 36 min  Charges:  $Therapeutic Activity: 23-37 mins                     Japleen Tornow E, PT Acute Rehabilitation Services Pager [phone]  Office [phone]  Lilliam Chamblee D Adalbert Alberto 01/23/2020, 1:50 PM

## 2020-01-23 NOTE — Progress Notes (Signed)
Attempted to see pt. Spoke with MD who asked therapy to hold off on seeing this pt today due to some prostate issues.  Will reschedule pt for a different day as schedule sees fit. Tory Emerald, OtR/L [phone]

## 2020-01-23 NOTE — Progress Notes (Signed)
Midline site assessment: line flushed, had brisk blood return. Pt resting with arm above head. If line is resistant, try repositioning arm.

## 2020-01-23 NOTE — Procedures (Signed)
Interventional Radiology Procedure:   Indications: Urinary retention and rectourethral fistula  Procedure: CT guided suprapubic tube placement  Findings: Massively distended bladder.  Placed 14 Fr drain and greater than 1.3 liters of bloody urine removed.  Complications: None     EBL: less than 10 ml  Plan: Continue bladder drainage and follow output.     Glenn Murphy R. Lowella Dandy, MD  Pager: [phone]

## 2020-01-23 NOTE — Consult Note (Signed)
Glenn Murphy 07/21/1976  [id number].    Requesting MD: Dr. [name] Chief Complaint/Reason for Consult: rectourethral fistula  HPI:  This is a 44 yo black male with a history of hidradenitis and morbid obesity with who was admitted in DKA with unknown DM.  He was treated by CCM initially.  A foley was placed for I&O monitoring.  Unfortunately, it is likely the foley was never placed correctly.  Urology was consulted and a new catheter was placed but ultimately likely never worked well as it was likely in a false cavity.  He was taken for a cysto yesterday and during wire advancement the patient began complaining of rectal pain.  A CT cystogram was then performed to evaluate placement of the catheter as well as his rectal pain.  This CT revealed his catheter to still not be in the urinary bladder and that he clearly had leakage of contrast from his urethra to his rectum c/w a fistula.  Around 0100am this morning the patient began having some bloody BMs.  He has had a total of 4 today that are bright red in nature.  He denies any further rectal pain or abdominal pain.  We have been asked to evaluate the patient for further recommendations of these findings.  ROS: ROS: Please see HPI, otherwise all other systems have been reviewed and are negative except very sleepy.  Family History  Problem Relation Age of Onset  . Cancer Father     Past Medical History:  Diagnosis Date  . Abscess    recurrent infections  . Arthritis   . Diabetes mellitus without complication (HCC)   . Hypertension   . Obesity     Past Surgical History:  Procedure Laterality Date  . CYST EXCISION N/A 01/28/2016   Procedure: EXCISION SEBACEOUS CYST FOREHEAD X 2;  Surgeon: [name], MD;  Location: [facility] OR;  Service: General;  Laterality: N/A;  . CYST REMOVAL TRUNK Left 01/28/2016   Procedure: EXCISION SEBACEOUS CYST LEFT SHOULDER AND MID-BACK;  Surgeon: [name], MD;  Location: [facility] OR;  Service:  General;  Laterality: Left;  . HYDRADENITIS EXCISION Right 01/28/2016   Procedure: EXCISION HIDRADENITIS RIGHT AXILLA ;  Surgeon: [name], MD;  Location: [facility] OR;  Service: General;  Laterality: Right;  . HYDRADENITIS EXCISION N/A 01/28/2016   Procedure: EXCISION HIDRADENITIS POSTERIOR NECK ;  Surgeon: [name], MD;  Location: [facility] OR;  Service: General;  Laterality: N/A;    Social History:  reports that he has been smoking cigarettes. He has been smoking about 0.50 packs per day. He does not have any smokeless tobacco history on file. He reports current alcohol use. He reports current drug use. Frequency: 2.00 times per week. Drug: Marijuana.  Allergies:  Allergies  Allergen Reactions  . Other Other (See Comments)    Itchy throat-strawberries,watermelon    Medications Prior to Admission  Medication Sig Dispense Refill  . allopurinol (ZYLOPRIM) 300 MG tablet Take 300 mg by mouth daily.  3  . busPIRone (BUSPAR) 10 MG tablet Take 10 mg by mouth 3 (three) times daily.    . clindamycin (CLINDAGEL) 1 % gel Apply 1 application topically See admin instructions. Apply to bumps on face and body 1-2 times daily as needed.    . ferrous sulfate 325 (65 FE) MG tablet Take 325 mg by mouth daily.    [name] gabapentin (NEURONTIN) 600 MG tablet Take 600 mg by mouth 3 (three) times daily.    [name] IBU  800 MG tablet Take 800 mg by mouth every 8 (eight) hours.    . Magnesium 200 MG TABS Take 200 mg by mouth daily.    . metoprolol succinate (TOPROL-XL) 25 MG 24 hr tablet Take 25 mg by mouth daily.  4  . Milk Thistle Extract 175 MG TABS Take 175 mg by mouth daily.    . Omega-3 1000 MG CAPS Take 1 capsule by mouth daily.    [name] triamcinolone cream (KENALOG) 0.1 % Apply 1 application topically 2 (two) times daily as needed.   3  . triamterene-hydrochlorothiazide (MAXZIDE-25) 37.5-25 MG tablet Take 1 tablet by mouth daily.  3  . Vitamin D, Ergocalciferol, (DRISDOL) 1.25 MG (50000 UNIT) CAPS capsule Take 50,000 Units  by mouth once a week.       Physical Exam: Blood pressure 109/66, pulse (!) 108, temperature 99.2 F (37.3 C), temperature source Oral, resp. rate 18, height  (1.753 m), weight 135.5 kg, SpO2 100 %. General: pleasant, morbidly obese black male who is laying in bed in NAD HEENT: head is normocephalic, atraumatic.  Sclera are noninjected.  PERRL.  Ears and nose without any masses or lesions.  Mouth is pink and moist Heart: regular, rate, and rhythm.  Normal s1,s2. No obvious murmurs, gallops, or rubs noted.  Palpable radial and pedal pulses bilaterally Lungs: CTAB, no wheezes, rhonchi, or rales noted.  Respiratory effort nonlabored Abd: soft, NT, ND, morbidly obese, small wound noted on LLQ from his hidradenitis, +BS, no masses, hernias, or unable to palpate organs due to body habitus MS: all 4 extremities are symmetrical with no cyanosis, clubbing, or edema. Skin: warm and dry with no masses, lesions, or rashes Neuro: Cranial nerves 2-12 grossly intact, sensation is normal throughout Psych: A&Ox3 with an appropriate affect, but will fall asleep while talking to him at times.   Results for orders placed or performed during the hospital encounter of 01/14/20 (from the past 48 hour(s))  Glucose, capillary     Status: Abnormal   Collection Time: 01/21/20  4:47 PM  Result Value Ref Range   Glucose-Capillary 143 (H) 70 - 99 mg/dL    Comment: Glucose reference range applies only to samples taken after fasting for at least 8 hours.  Glucose, capillary     Status: Abnormal   Collection Time: 01/21/20  8:47 PM  Result Value Ref Range   Glucose-Capillary 122 (H) 70 - 99 mg/dL    Comment: Glucose reference range applies only to samples taken after fasting for at least 8 hours.  CBC with Differential/Platelet     Status: Abnormal   Collection Time: 01/22/20  3:32 AM  Result Value Ref Range   WBC 8.7 4.0 - 10.5 K/uL   RBC 2.88 (L) 4.22 - 5.81 MIL/uL   Hemoglobin 9.3 (L) 13.0 - 17.0 g/dL    HCT 16.1 (L) 09.6 - 52.0 %   MCV 93.4 80.0 - 100.0 fL   MCH 32.3 26.0 - 34.0 pg   MCHC 34.6 30.0 - 36.0 g/dL   RDW 04.5 40.9 - 81.1 %   Platelets 140 (L) 150 - 400 K/uL   nRBC 0.0 0.0 - 0.2 %   Neutrophils Relative % 72 %   Neutro Abs 6.2 1.7 - 7.7 K/uL   Lymphocytes Relative 22 %   Lymphs Abs 1.9 0.7 - 4.0 K/uL   Monocytes Relative 4 %   Monocytes Absolute 0.4 0.1 - 1.0 K/uL   Eosinophils Relative 1 %   Eosinophils Absolute 0.1  0.0 - 0.5 K/uL   Basophils Relative 0 %   Basophils Absolute 0.0 0.0 - 0.1 K/uL   WBC Morphology VACUOLATED NEUTROPHILS    Immature Granulocytes 1 %   Abs Immature Granulocytes 0.12 (H) 0.00 - 0.07 K/uL   Polychromasia PRESENT     Comment: Performed at [facility], [address], [place], [place] [address]  Basic metabolic panel     Status: Abnormal   Collection Time: 01/22/20  3:32 AM  Result Value Ref Range   Sodium 137 135 - 145 mmol/L   Potassium 3.6 3.5 - 5.1 mmol/L   Chloride 110 98 - 111 mmol/L   CO2 15 (L) 22 - 32 mmol/L   Glucose, Bld 104 (H) 70 - 99 mg/dL    Comment: Glucose reference range applies only to samples taken after fasting for at least 8 hours.   BUN 35 (H) 6 - 20 mg/dL   Creatinine, Ser 8.29 (H) 0.61 - 1.24 mg/dL   Calcium 8.1 (L) 8.9 - 10.3 mg/dL   GFR calc non Af Amer 52 (L) >60 mL/min   GFR calc Af Amer >60 >60 mL/min   Anion gap 12 5 - 15    Comment: Performed at [facility], [address] [place], [place] [address]  Glucose, capillary     Status: None   Collection Time: 01/22/20  7:04 AM  Result Value Ref Range   Glucose-Capillary 88 70 - 99 mg/dL    Comment: Glucose reference range applies only to samples taken after fasting for at least 8 hours.  Glucose, capillary     Status: Abnormal   Collection Time: 01/22/20  7:30 AM  Result Value Ref Range   Glucose-Capillary 111 (H) 70 - 99 mg/dL    Comment: Glucose reference range applies only to samples taken after fasting for at least 8 hours.  Glucose,  capillary     Status: None   Collection Time: 01/22/20 11:56 AM  Result Value Ref Range   Glucose-Capillary 91 70 - 99 mg/dL    Comment: Glucose reference range applies only to samples taken after fasting for at least 8 hours.  Glucose, capillary     Status: Abnormal   Collection Time: 01/22/20  5:02 PM  Result Value Ref Range   Glucose-Capillary 119 (H) 70 - 99 mg/dL    Comment: Glucose reference range applies only to samples taken after fasting for at least 8 hours.  Glucose, capillary     Status: Abnormal   Collection Time: 01/22/20  7:45 PM  Result Value Ref Range   Glucose-Capillary 183 (H) 70 - 99 mg/dL    Comment: Glucose reference range applies only to samples taken after fasting for at least 8 hours.  CBC with Differential/Platelet     Status: Abnormal   Collection Time: 01/23/20  4:20 AM  Result Value Ref Range   WBC 9.9 4.0 - 10.5 K/uL   RBC 2.86 (L) 4.22 - 5.81 MIL/uL   Hemoglobin 9.4 (L) 13.0 - 17.0 g/dL   HCT 96.7 (L) 89.3 - 81.0 %   MCV 95.8 80.0 - 100.0 fL   MCH 32.9 26.0 - 34.0 pg   MCHC 34.3 30.0 - 36.0 g/dL   RDW 17.5 10.2 - 58.5 %   Platelets 215 150 - 400 K/uL   nRBC 0.2 0.0 - 0.2 %   Neutrophils Relative % 87 %   Neutro Abs 8.6 (H) 1.7 - 7.7 K/uL   Lymphocytes Relative 10 %  Lymphs Abs 1.0 0.7 - 4.0 K/uL   Monocytes Relative 3 %   Monocytes Absolute 0.3 0.1 - 1.0 K/uL   Eosinophils Relative 0 %   Eosinophils Absolute 0.0 0.0 - 0.5 K/uL   Basophils Relative 0 %   Basophils Absolute 0.0 0.0 - 0.1 K/uL   nRBC 0 0 /100 WBC   Abs Immature Granulocytes 0.00 0.00 - 0.07 K/uL   Polychromasia PRESENT     Comment: Performed at [facility], [address] [place], [place] [address]  Basic metabolic panel     Status: Abnormal   Collection Time: 01/23/20  4:20 AM  Result Value Ref Range   Sodium 136 [facility]35 - 145 mmol/L   Potassium 3.7 3.5 - 5.1 mmol/L   Chloride 109 98 - 111 mmol/L   CO2 17 (L) 22 - 32 mmol/L   Glucose, Bld 143 (H) 70 - 99 mg/dL     Comment: Glucose reference range applies only to samples taken after fasting for at least 8 hours.   BUN 27 (H) 6 - 20 mg/dL   Creatinine, Ser 1.911.49 (H) 0.61 - 1.24 mg/dL   Calcium 8.4 (L) 8.9 - 10.3 mg/dL   GFR calc non Af Amer 55 (L) >60 mL/min   GFR calc Af Amer >60 >60 mL/min   Anion gap 10 5 - 15    Comment: Performed at [facility], [address] [place], [place] [address]  Magnesium     Status: None   Collection Time: 01/23/20  4:20 AM  Result Value Ref Range   Magnesium 1.9 1.7 - 2.4 mg/dL    Comment: Performed at [facility], [address] [place], [place] [address]  Phosphorus     Status: None   Collection Time: 01/23/20  4:20 AM  Result Value Ref Range   Phosphorus 4.5 2.5 - 4.6 mg/dL    Comment: Performed at [facility], [address] [place], [place] [address]  Glucose, capillary     Status: Abnormal   Collection Time: 01/23/20  7:52 AM  Result Value Ref Range   Glucose-Capillary 110 (H) 70 - 99 mg/dL    Comment: Glucose reference range applies only to samples taken after fasting for at least 8 hours.  Glucose, capillary     Status: None   Collection Time: 01/23/20 11:40 AM  Result Value Ref Range   Glucose-Capillary 89 70 - 99 mg/dL    Comment: Glucose reference range applies only to samples taken after fasting for at least 8 hours.   CT PELVIS WO CONTRAST  Result Date: 01/23/2020 CLINICAL DATA:  Pelvic trauma bloody stools with known urethral injury EXAM: CT PELVIS WITHOUT CONTRAST TECHNIQUE: Multidetector CT imaging of the pelvis was performed following the standard protocol without intravenous contrast. Water-soluble contrast was infused into the existing Foley catheter. COMPARISON:  01/22/2020 FINDINGS: Urinary Tract: Foley catheter is inflated at the pelvic floor in the bulbar urethra. Urethral injury is confirmed with contrast outlining extraperitoneal spaces of the pelvis, extending along the bladder base and filling the rectum. Gas is  present in the urinary bladder. There is ascites along the left and right pericolic gutter. Volume of fluid similar to the recent comparison. Bowel: Vascular structures not well assessed given lack of intravenous contrast. Extensive calcified atherosclerotic changes of the distal abdominal aorta. No adenopathy in the pelvis. Reproductive: Prostate is not well evaluated. At the level of the mid prostate or suspected level of the mid  prostate there contrast which extends outside of the area of the prostate extending around the expected location of the prostate as well, this raises the question of concomitant prosthetic urethral injury and injury to the prostate. This cannot be well delineated on the current study. Prostate could also be quite small and displaced to the right and anterior to the area of urethral injury. Other:  Ascites as above. Musculoskeletal: Spinal degenerative changes without acute or destructive bone process. IMPRESSION: 1. Urethral injury is confirmed with contrast outlining extraperitoneal spaces of the pelvis, extending along the bladder base, outlining and filling the rectum. 2. Urethral injury likely at the membranous portion of the urethra, likely at the level of the UG diaphragm. Prostate cannot be assessed on the current exam and may be displaced by extensive inflammation in the pelvis. 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 3. Gas is present in the urinary bladder. 4. There is ascites along the left and right pericolic gutter. Volume of fluid similar to the recent comparison. Patient has suspected liver disease. Continued correlation with creatinine levels may be helpful. There is no extension of contrast into the peritoneal cavity on today's exam though the urinary bladder is not opacified and remains distended. Urethral injury and existing Foley catheter likely contribute to bladder outlet obstruction. 5. Extensive calcified atherosclerotic changes of the distal abdominal  aorta. 6. A call is out to the referring provider to further discuss findings in the above case. Aortic Atherosclerosis (ICD10-I70.0). Electronically Signed   By: Zetta Bills M.D.   On: 01/23/2020 12:49   US PELVIS LIMITED (TRANSABDOMINAL ONLY)  Result Date: 01/22/2020 CLINICAL DATA:  Foley catheter malpositioning EXAM: LIMITED ULTRASOUND OF PELVIS TECHNIQUE: Limited transabdominal ultrasound examination of the pelvis was performed. COMPARISON:  01/22/2020 FINDINGS: The urinary bladder is completely decompressed which limits evaluation. Transverse images demonstrate likely balloon from the Foley catheter within the decompressed bladder. IMPRESSION: 1. Severely limited study as the bladder is decompressed on this exam. Likely Foley catheter balloon within the decompressed bladder as above. If there is still concern for Foley catheter malpositioning, repeat CT of the pelvis could be considered. Electronically Signed   By: Randa Ngo M.D.   On: 01/22/2020 22:36   CT RENAL STONE STUDY  Result Date: 01/22/2020 CLINICAL DATA:  Inpatient. Bladder mass. Bladder pain/fullness. EXAM: CT ABDOMEN AND PELVIS WITHOUT CONTRAST TECHNIQUE: Multidetector CT imaging of the abdomen and pelvis was performed following the standard protocol without IV contrast. COMPARISON:  01/20/2020 renal sonogram. FINDINGS: Lower chest: Two peripheral left lower lobe pulmonary nodules, largest 6 mm (series 4/image 9). Coronary atherosclerosis. Hepatobiliary: Diffuse hepatic steatosis. Slight hypertrophy of the lateral segment left liver lobe. Questionable fine liver surface irregularity, cannot exclude cirrhosis. No liver mass. Normal gallbladder with no radiopaque cholelithiasis. No biliary ductal dilatation. Pancreas: Normal, with no mass or duct dilation. Spleen: Normal size. No mass. Adrenals/Urinary Tract: Normal adrenals. No contour deforming renal masses. Mild bilateral hydroureteronephrosis to the level of the ureterovesical  junctions bilaterally. No renal or ureteral stones. Prominently distended urinary bladder. Gas in the nondependent bladder. No bladder stones or wall thickening. Stomach/Bowel: Normal non-distended stomach. Normal caliber small bowel with no small bowel wall thickening. Normal appendix. Minimal left colonic diverticulosis with no definite large bowel wall thickening. Large bowel is largely collapsed. Vascular/Lymphatic: Atherosclerotic nonaneurysmal abdominal aorta. Small paraumbilical varix. Mild porta hepatis adenopathy up to 1.6 cm (series 3/image 31). No additional pathologically enlarged lymph nodes in the abdomen or pelvis. Reproductive: Foley catheter is  malpositioned with Foley balloon distended within the bulbar urethra, with the tip of the Foley within right prostatic peripheral zone (series 3/image 88), probably outside of the urethra. There is scattered soft tissue emphysema surrounding the Foley balloon. Prostate size appears normal. Other: No pneumoperitoneum. Small volume ascites. No focal fluid collection. Musculoskeletal: No aggressive appearing focal osseous lesions. Marked lower lumbar spondylosis. IMPRESSION: 1. Foley catheter is malpositioned with Foley balloon distended within the bulbar urethra, with the tip of the Foley within the right prostatic peripheral zone, probably outside of the urethra. Scattered soft tissue emphysema surrounding the Foley balloon. 2. Evidence of bladder outlet obstruction with prominently distended bladder and mild bilateral hydroureteronephrosis. No urolithiasis. 3. Diffuse hepatic steatosis. Subtle morphologic changes in the liver suggestive of cirrhosis. No liver mass. Small volume ascites. Small paraumbilical varix. 4. Nonspecific mild porta hepatis adenopathy, potentially reactive. 5. Two left lower lobe pulmonary nodules, largest 6 mm. Non-contrast chest CT at 3-6 months is recommended. If the nodules are stable at time of repeat CT, then future CT at 18-24  months (from today's scan) is considered optional for low-risk patients, but is recommended for high-risk patients. This recommendation follows the consensus statement: Guidelines for Management of Incidental Pulmonary Nodules Detected on CT Images: From the Fleischner Society 2017; Radiology 2017; 284:228-243. 6. Coronary atherosclerosis. 7. Aortic Atherosclerosis (ICD10-I70.0). These results were called by telephone at the time of interpretation on 01/22/2020 at 3:04 pm to provider [facility] , who verbally acknowledged these results. Electronically Signed   By: Delbert Phenix M.D.   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  SP tube being placed for urinary diversion which will help.  He has also been started on zosyn which is appropriate for now.  He is having BRBPR right now and multiple bouts.  Will follow cbc but hgb was stable this morning.  This is likely self-limited.  However, if the bleeding does not stop, he may require an EUA to evaluate the rectum.  This was discussed with the patient as well as his wife at the bedside.  They understand.  We will follow up with you for now.   FEN - ok to have a diet from our standpoint VTE - per primary service ID - [email or web address]   Letha Cape, [facility] 01/23/2020, 4:15 PM Please see Amion for pager number during day hours 7:00am-4:30pm or 7:00am -11:30am on weekends

## 2020-01-23 NOTE — Progress Notes (Signed)
I have reviewed the patient's CT cystogram.  There is obvious extravasation from the urethra, mis placement of the catheter, as well as presence of rectal contrast.  There is an obvious rectourethral fistula.  I have spoken with the patient and his wife about this.  I will move forward with having suprapubic tube placement by interventional radiology tonight.  I have also requested general surgery see the patient.  I am unfamiliar with this type of acute injury, but literature shows that urinary diversion may well allow for this process to heal without surgery.  However, I will leave definitive management of the rectal injury up to general surgery.

## 2020-01-23 NOTE — Progress Notes (Signed)
Pharmacy Antibiotic Note  Glenn Murphy is a 44 y.o. 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 Pharmacy has been consulted for pipercillin/tazobactam dosing.  Patient with new bloody rectal and foley drainage. Patient with suprapubic discomfort and penile pain. WBC wnl, afebrile. CT cystogram pending. Possible suprapubic catheter and diverting colostomy today. 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   Plan: Start pip/tazo 3.375g Q8 hr  Monitor cultures, procedures, clinical status, renal fx Narrow abx as able and f/u duration  Resume amoxicillin TID for 3-6 months per ID after urethra-rectal fistula treated    Height: 5\' 9"  (175.3 cm) Weight: 135.5 kg (298 lb 11.6 oz) IBW/kg (Calculated) : 70.7  Temp (24hrs), Avg:98.3 F (36.8 C), Min:97.9 F (36.6 C), Max:99.3 F (37.4 C)  Recent Labs  Lab 01/19/20 0203 01/19/20 0203 01/19/20 0911 01/20/20 0412 01/21/20 0523 01/22/20 0332 01/23/20 0420  WBC 8.6  --   --  11.1* 13.0* 8.7 9.9  CREATININE 1.84*   < > 1.70* 1.47* 1.66* 1.56* 1.49*   < > = values in this interval not displayed.    Estimated Creatinine Clearance: 84.6 mL/min (A) (by C-G formula based on SCr of 1.49 mg/dL (H)).     Antimicrobials this admission: Amox 4/13>>4/15  Zosyn 4/6 >>4/7; 4/15>>  Vanc 4/6 >>4/7 CTX 4/7>>4/8  Microbiology results: 4/6 Bcx: 1/4 Actinomyces naeslundii 4/6 Ucx: ngF 4/6 MRSA PCR: negative  Thank you for allowing pharmacy to be a part of this patient's care.  6/6 01/23/2020 2:15 PM

## 2020-01-23 NOTE — Progress Notes (Signed)
Notified Dr Retta Diones, Urologist about patient's internal catheter not draining with urine and blood coming out of patient's rectum with c/o pressure to lower abdomen. New order received.

## 2020-01-24 DIAGNOSIS — E1111 Type 2 diabetes mellitus with ketoacidosis with coma: Secondary | ICD-10-CM | POA: Diagnosis not present

## 2020-01-24 DIAGNOSIS — S3660XA Unspecified injury of rectum, initial encounter: Secondary | ICD-10-CM | POA: Diagnosis not present

## 2020-01-24 DIAGNOSIS — R338 Other retention of urine: Secondary | ICD-10-CM | POA: Diagnosis present

## 2020-01-24 LAB — CBC WITH DIFFERENTIAL/PLATELET
Abs Immature Granulocytes: 0.06 10*3/uL (ref 0.00–0.07)
Basophils Absolute: 0 10*3/uL (ref 0.0–0.1)
Basophils Relative: 0 %
Eosinophils Absolute: 0.1 10*3/uL (ref 0.0–0.5)
Eosinophils Relative: 1 %
HCT: 21.3 % — ABNORMAL LOW (ref 39.0–52.0)
Hemoglobin: 7.1 g/dL — ABNORMAL LOW (ref 13.0–17.0)
Immature Granulocytes: 1 %
Lymphocytes Relative: 29 %
Lymphs Abs: 2.6 10*3/uL (ref 0.7–4.0)
MCH: 32.1 pg (ref 26.0–34.0)
MCHC: 33.3 g/dL (ref 30.0–36.0)
MCV: 96.4 fL (ref 80.0–100.0)
Monocytes Absolute: 0.4 10*3/uL (ref 0.1–1.0)
Monocytes Relative: 4 %
Neutro Abs: 5.8 10*3/uL (ref 1.7–7.7)
Neutrophils Relative %: 65 %
Platelets: 182 10*3/uL (ref 150–400)
RBC: 2.21 MIL/uL — ABNORMAL LOW (ref 4.22–5.81)
RDW: 15 % (ref 11.5–15.5)
WBC: 8.9 10*3/uL (ref 4.0–10.5)
nRBC: 0 % (ref 0.0–0.2)

## 2020-01-24 LAB — BASIC METABOLIC PANEL
Anion gap: 10 (ref 5–15)
BUN: 22 mg/dL — ABNORMAL HIGH (ref 6–20)
CO2: 17 mmol/L — ABNORMAL LOW (ref 22–32)
Calcium: 8.1 mg/dL — ABNORMAL LOW (ref 8.9–10.3)
Chloride: 111 mmol/L (ref 98–111)
Creatinine, Ser: 1.4 mg/dL — ABNORMAL HIGH (ref 0.61–1.24)
GFR calc Af Amer: >60 mL/min (ref >60)
GFR calc non Af Amer: 60 mL/min — ABNORMAL LOW (ref >60)
Glucose, Bld: 109 mg/dL — ABNORMAL HIGH (ref 70–99)
Potassium: 4 mmol/L (ref 3.5–5.1)
Sodium: 138 mmol/L (ref 135–145)

## 2020-01-24 LAB — GLUCOSE, CAPILLARY
Glucose-Capillary: 85 mg/dL (ref 70–99)
Glucose-Capillary: 119 mg/dL — ABNORMAL HIGH (ref 70–99)
Glucose-Capillary: 138 mg/dL — ABNORMAL HIGH (ref 70–99)
Glucose-Capillary: 113 mg/dL — ABNORMAL HIGH (ref 70–99)

## 2020-01-24 MED ORDER — NOVOLOG FLEXPEN 100 UNIT/ML ~~LOC~~ SOPN: 4.0000 [IU] | PEN_INJECTOR | Freq: Three times a day (TID) | SUBCUTANEOUS | 11 refills | Status: DC

## 2020-01-24 MED ORDER — LANTUS SOLOSTAR 100 UNIT/ML ~~LOC~~ SOPN: 10.0000 [IU] | PEN_INJECTOR | Freq: Two times a day (BID) | SUBCUTANEOUS | 11 refills | Status: DC

## 2020-01-24 NOTE — Progress Notes (Signed)
Patient ID: Glenn Murphy, male   DOB: 06/23/1976, 44 y.o.   MRN: [id number]       Subjective: Patient ok this morning.  Hasn't had any further bloody BMs.  No abdominal/rectal pain.  ROS: See above, otherwise other systems negative  Objective: Vital signs in last 24 hours: Temp:  [97.5 F (36.4 C)-99.3 F (37.4 C)] 97.5 F (36.4 C) (04/16 0530) Pulse Rate:  [98-117] 98 (04/16 0530) Resp:  [18-26] 18 (04/16 0530) BP: (101-127)/(56-76) 120/74 (04/16 0530) SpO2:  [95 %-100 %] 100 % (04/16 0530) Weight:  [133.6 kg] 133.6 kg (04/16 0530) Last BM Date: 01/24/20  Intake/Output from previous day: 04/15 0701 - 04/16 0700 In: 2597 [P.O.:120; I.V.:2322.2; IV Piggyback:154.8] Out: 3000 [Urine:3000] Intake/Output this shift: Total I/O In: -  Out: 100 [Urine:100]  PE: Abd: soft, SP tube in place, otherwise nontender, +BS, obese  Lab Results:  Recent Labs    01/23/20 0420 01/24/20 0137  WBC 9.9 8.9  HGB 9.4* 7.1*  HCT 27.4* 21.3*  PLT 215 182   BMET Recent Labs    01/23/20 0420 01/24/20 0137  NA 136 138  K 3.7 4.0  CL 109 111  CO2 17* 17*  GLUCOSE 143* 109*  BUN 27* 22*  CREATININE 1.49* 1.40*  CALCIUM 8.4* 8.1*   PT/INR Recent Labs    01/23/20 1643  LABPROT 16.1*  INR 1.3*   CMP     Component Value Date/Time   NA 138 01/24/2020 0137   K 4.0 01/24/2020 0137   CL 111 01/24/2020 0137   CO2 17 (L) 01/24/2020 0137   GLUCOSE 109 (H) 01/24/2020 0137   BUN 22 (H) 01/24/2020 0137   CREATININE 1.40 (H) 01/24/2020 0137   CALCIUM 8.1 (L) 01/24/2020 0137   PROT 7.8 01/19/2020 0203   ALBUMIN 1.4 (L) 01/19/2020 0203   AST 35 01/19/2020 0203   ALT 24 01/19/2020 0203   ALKPHOS 134 (H) 01/19/2020 0203   BILITOT 3.7 (H) 01/19/2020 0203   GFRNONAA 60 (L) 01/24/2020 0137   GFRAA >60 01/24/2020 0137   Lipase     Component Value Date/Time   LIPASE 40 01/14/2020 1930       Studies/Results: CT PELVIS WO CONTRAST  Result Date: 01/23/2020 CLINICAL DATA:   Pelvic trauma bloody stools with known urethral injury EXAM: CT PELVIS WITHOUT CONTRAST TECHNIQUE: Multidetector CT imaging of the pelvis was performed following the standard protocol without intravenous contrast. Water-soluble contrast was infused into the existing Foley catheter. COMPARISON:  01/22/2020 FINDINGS: Urinary Tract: Foley catheter is inflated at the pelvic floor in the bulbar urethra. Urethral injury is confirmed with contrast outlining extraperitoneal spaces of the pelvis, extending along the bladder base and filling the rectum. Gas is present in the urinary bladder. There is ascites along the left and right pericolic gutter. Volume of fluid similar to the recent comparison. Bowel: Vascular structures not well assessed given lack of intravenous contrast. Extensive calcified atherosclerotic changes of the distal abdominal aorta. No adenopathy in the pelvis. Reproductive: Prostate is not well evaluated. At the level of the mid prostate or suspected level of the mid prostate there contrast which extends outside of the area of the prostate extending around the expected location of the prostate as well, this raises the question of concomitant prosthetic urethral injury and injury to the prostate. This cannot be well delineated on the current study. Prostate could also be quite small and displaced to the right and anterior to the area of urethral injury. Other:  Ascites as above. Musculoskeletal: Spinal degenerative changes without acute or destructive bone process. IMPRESSION: 1. Urethral injury is confirmed with contrast outlining extraperitoneal spaces of the pelvis, extending along the bladder base, outlining and filling the rectum. 2. Urethral injury likely at the membranous portion of the urethra, likely at the level of the UG diaphragm. Prostate cannot be assessed on the current exam and may be displaced by extensive inflammation in the pelvis. 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 3.  Gas is present in the urinary bladder. 4. There is ascites along the left and right pericolic gutter. Volume of fluid similar to the recent comparison. Patient has suspected liver disease. Continued correlation with creatinine levels may be helpful. There is no extension of contrast into the peritoneal cavity on today's exam though the urinary bladder is not opacified and remains distended. Urethral injury and existing Foley catheter likely contribute to bladder outlet obstruction. 5. Extensive calcified atherosclerotic changes of the distal abdominal aorta. 6. A call is out to the referring provider to further discuss findings in the above case. Aortic Atherosclerosis (ICD10-I70.0). Electronically Signed   By: Donzetta Kohut M.D.   On: 01/23/2020 12:49   US PELVIS LIMITED (TRANSABDOMINAL ONLY)  Result Date: 01/22/2020 CLINICAL DATA:  Foley catheter malpositioning EXAM: LIMITED ULTRASOUND OF PELVIS TECHNIQUE: Limited transabdominal ultrasound examination of the pelvis was performed. COMPARISON:  01/22/2020 FINDINGS: The urinary bladder is completely decompressed which limits evaluation. Transverse images demonstrate likely balloon from the Foley catheter within the decompressed bladder. IMPRESSION: 1. Severely limited study as the bladder is decompressed on this exam. Likely Foley catheter balloon within the decompressed bladder as above. If there is still concern for Foley catheter malpositioning, repeat CT of the pelvis could be considered. Electronically Signed   By: Sharlet Salina M.D.   On: 01/22/2020 22:36   CT RENAL STONE STUDY  Result Date: 01/22/2020 CLINICAL DATA:  Inpatient. Bladder mass. Bladder pain/fullness. EXAM: CT ABDOMEN AND PELVIS WITHOUT CONTRAST TECHNIQUE: Multidetector CT imaging of the abdomen and pelvis was performed following the standard protocol without IV contrast. COMPARISON:  01/20/2020 renal sonogram. FINDINGS: Lower chest: Two peripheral left lower lobe pulmonary nodules,  largest 6 mm (series 4/image 9). Coronary atherosclerosis. Hepatobiliary: Diffuse hepatic steatosis. Slight hypertrophy of the lateral segment left liver lobe. Questionable fine liver surface irregularity, cannot exclude cirrhosis. No liver mass. Normal gallbladder with no radiopaque cholelithiasis. No biliary ductal dilatation. Pancreas: Normal, with no mass or duct dilation. Spleen: Normal size. No mass. Adrenals/Urinary Tract: Normal adrenals. No contour deforming renal masses. Mild bilateral hydroureteronephrosis to the level of the ureterovesical junctions bilaterally. No renal or ureteral stones. Prominently distended urinary bladder. Gas in the nondependent bladder. No bladder stones or wall thickening. Stomach/Bowel: Normal non-distended stomach. Normal caliber small bowel with no small bowel wall thickening. Normal appendix. Minimal left colonic diverticulosis with no definite large bowel wall thickening. Large bowel is largely collapsed. Vascular/Lymphatic: Atherosclerotic nonaneurysmal abdominal aorta. Small paraumbilical varix. Mild porta hepatis adenopathy up to 1.6 cm (series 3/image 31). No additional pathologically enlarged lymph nodes in the abdomen or pelvis. Reproductive: Foley catheter is malpositioned with Foley balloon distended within the bulbar urethra, with the tip of the Foley within right prostatic peripheral zone (series 3/image 88), probably outside of the urethra. There is scattered soft tissue emphysema surrounding the Foley balloon. Prostate size appears normal. Other: No pneumoperitoneum. Small volume ascites. No focal fluid collection. Musculoskeletal: No aggressive appearing focal osseous lesions. Marked lower lumbar spondylosis. IMPRESSION: 1. Foley  catheter is malpositioned with Foley balloon distended within the bulbar urethra, with the tip of the Foley within the right prostatic peripheral zone, probably outside of the urethra. Scattered soft tissue emphysema surrounding the  Foley balloon. 2. Evidence of bladder outlet obstruction with prominently distended bladder and mild bilateral hydroureteronephrosis. No urolithiasis. 3. Diffuse hepatic steatosis. Subtle morphologic changes in the liver suggestive of cirrhosis. No liver mass. Small volume ascites. Small paraumbilical varix. 4. Nonspecific mild porta hepatis adenopathy, potentially reactive. 5. Two left lower lobe pulmonary nodules, largest 6 mm. Non-contrast chest CT at 3-6 months is recommended. If the nodules are stable at time of repeat CT, then future CT at 18-24 months (from today's scan) is considered optional for low-risk patients, but is recommended for high-risk patients. This recommendation follows the consensus statement: Guidelines for Management of Incidental Pulmonary Nodules Detected on CT Images: From the Fleischner Society 2017; Radiology 2017; 284:228-243. 6. Coronary atherosclerosis. 7. Aortic Atherosclerosis (ICD10-I70.0). These results were called by telephone at the time of interpretation on 01/22/2020 at 3:04 pm to provider [facility] , who verbally acknowledged these results. Electronically Signed   By: Ilona Sorrel M.D.   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 EXAM: CT-GUIDED PLACEMENT OF SUPRAPUBIC BLADDER CATHETER MEDICATIONS: Moderate sedation ANESTHESIA/SEDATION: Fentanyl 100 mcg IV; Versed 2.0 mg IV Moderate Sedation Time:  20 minutes The patient was continuously monitored during the procedure by the interventional radiology nurse under my direct supervision. CONTRAST:  None FLUOROSCOPY TIME:  None COMPLICATIONS: None immediate. PROCEDURE: The procedure was explained to the patient. The risks and benefits of the procedure were discussed and the patient's questions were addressed. Informed consent was obtained from the patient. Patient was placed supine on the CT  scanner. CT images through the lower abdomen and pelvis were obtained. Anterior abdomen was marked. Skin was prepped and draped in sterile fashion. Maximal barrier sterile technique was utilized including caps, mask, sterile gowns, sterile gloves, sterile drape, hand hygiene and skin antiseptic. Skin was anesthetized with 1% lidocaine. Small incision was made. Using CT guidance, 18 gauge trocar needle was directed into the urinary bladder. Super stiff Amplatz wire was advanced into the bladder. The tract was dilated to accommodate a 14 [place] multipurpose drain. Approximately 1.3 L of bloody urine was drained from the bladder. Catheter was sutured to skin. Bandage placed over the catheter. FINDINGS: Urinary bladder was severely distended with a small amount of gas. 69 French drain was successfully placed within the bladder. Approximately 1.3 L of bloody urine was drained from the bladder by the end of the procedure. IMPRESSION: Successful CT-guided placement of a suprapubic bladder catheter. Electronically Signed   By: Markus Daft M.D.   On: 01/24/2020 08:34    Anti-infectives: Anti-infectives (From admission, onward)   Start     Dose/Rate Route Frequency Ordered Stop   01/23/20 1430  piperacillin-tazobactam (ZOSYN) IVPB 3.375 g     3.375 g 12.5 mL/hr over 240 Minutes Intravenous Every 8 hours 01/23/20 1415     01/21/20 0930  amoxicillin (AMOXIL) capsule 500 mg  Status:  Discontinued     500 mg Oral Every 8 hours 01/21/20 0924 01/23/20 1415   01/21/20 0000  amoxicillin (AMOXIL) 500 MG capsule     500 mg Oral 3 times daily 01/21/20 0931     01/21/20 0000  amoxicillin (AMOXIL) 500 MG capsule     500 mg  Oral 3 times daily 01/21/20 1042     01/15/20 2200  vancomycin (VANCOREADY) IVPB 1500 mg/300 mL  Status:  Discontinued     1,500 mg 150 mL/hr over 120 Minutes Intravenous Every 24 hours 01/14/20 2154 01/15/20 1019   01/15/20 1500  cefTRIAXone (ROCEPHIN) 2 g in sodium chloride 0.9 % 100 mL IVPB  Status:   Discontinued     2 g 200 mL/hr over 30 Minutes Intravenous Every 24 hours 01/15/20 1019 01/17/20 1114   01/15/20 0400  piperacillin-tazobactam (ZOSYN) IVPB 3.375 g  Status:  Discontinued     3.375 g 12.5 mL/hr over 240 Minutes Intravenous Every 8 hours 01/14/20 2154 01/15/20 1019   01/14/20 2200  piperacillin-tazobactam (ZOSYN) IVPB 3.375 g     3.375 g 100 mL/hr over 30 Minutes Intravenous  Once 01/14/20 2154 01/14/20 2339   01/14/20 2200  vancomycin (VANCOCIN) 2,500 mg in sodium chloride 0.9 % 500 mL IVPB     2,500 mg 250 mL/hr over 120 Minutes Intravenous  Once 01/14/20 2154 01/15/20 0402       Assessment/Plan DM Morbid obesity Hidradenitis  Rectourethral fistula with BRBPR -hgb down to 7 today, but no further bleeding.  If this recurs may need EUA vs GI for flex sig to see what is bleeding and to correct it. -this is likely self-limited and will resolved on its own, the bleeding and the fistula. -no surgical intervention at this time. -will defer to medicine on need for transfusion.   FEN - carb mod diet VTE - per primary service ID - [email or web address]   LOS: 10 days    Letha Cape , [facility] 01/24/2020, 9:32 AM Please see Amion for pager number during day hours 7:00am-4:30pm or 7:00am -11:30am on weekends

## 2020-01-24 NOTE — Progress Notes (Signed)
Subjective: Patient reports less bloody/liquid BMs. No pain. + pneumaturia.  Objective: Vital signs in last 24 hours: Temp:  [97.5 F (36.4 C)-99.1 F (37.3 C)] 98.7 F (37.1 C) (04/16 1529) Pulse Rate:  [92-110] 92 (04/16 1529) Resp:  [18-19] 19 (04/16 1600) BP: (114-120)/(60-74) 118/68 (04/16 1529) SpO2:  [100 %] 100 % (04/16 1529) Weight:  [133.6 kg] 133.6 kg (04/16 0530)  Intake/Output from previous day: 04/15 0701 - 04/16 0700 In: 2597 [P.O.:120; I.V.:2322.2; IV Piggyback:154.8] Out: 3000 [Urine:3000] Intake/Output this shift: Total I/O In: 1344.1 [P.O.:237; I.V.:1033.3; IV Piggyback:73.7] Out: 200 [Urine:200]  Physical Exam:  Constitutional: Vital signs reviewed. WD WN in NAD   Eyes: PERRL, No scleral icterus.   Cardiovascular: RRR Pulmonary/Chest: Normal effort Abdominal: SP site looks fine.  Extremities: ++edema   Lab Results: Recent Labs    01/22/20 0332 01/23/20 0420 01/24/20 0137  HGB 9.3* 9.4* 7.1*  HCT 26.9* 27.4* 21.3*   BMET Recent Labs    01/23/20 0420 01/24/20 0137  NA 136 138  K 3.7 4.0  CL 109 111  CO2 17* 17*  GLUCOSE 143* 109*  BUN 27* 22*  CREATININE 1.49* 1.40*  CALCIUM 8.4* 8.1*   Recent Labs    01/23/20 1643  INR 1.3*   No results for input(s): LABURIN in the last 72 hours. Results for orders placed or performed during the hospital encounter of 01/14/20  Respiratory Panel by RT PCR (Flu A&B, Covid) - Nasopharyngeal Swab     Status: None   Collection Time: 01/14/20  8:22 PM   Specimen: Nasopharyngeal Swab  Result Value Ref Range Status   SARS Coronavirus 2 by RT PCR NEGATIVE NEGATIVE Final    Comment: (NOTE) SARS-CoV-2 target nucleic acids are NOT DETECTED. The SARS-CoV-2 RNA is generally detectable in upper respiratoy specimens during the acute phase of infection. The lowest concentration of SARS-CoV-2 viral copies this assay can detect is 131 copies/mL. A negative result does not preclude SARS-Cov-2 infection and  should not be used as the sole basis for treatment or other patient management decisions. A negative result may occur with  improper specimen collection/handling, submission of specimen other than nasopharyngeal swab, presence of viral mutation(s) within the areas targeted by this assay, and inadequate number of viral copies (<131 copies/mL). A negative result must be combined with clinical observations, patient history, and epidemiological information. The expected result is Negative. Fact Sheet for Patients:  https://www.moore.com/ Fact Sheet for Healthcare Providers:  https://www.young.biz/ This test is not yet ap proved or cleared by the [place] FDA and  has been authorized for detection and/or diagnosis of SARS-CoV-2 by FDA under an Emergency Use Authorization (EUA). This EUA will remain  in effect (meaning this test can be used) for the duration of the COVID-19 declaration under Section 564(b)(1) of the Act, 21 U.S.C. section 360bbb-3(b)(1), unless the authorization is terminated or revoked sooner.    Influenza A by PCR NEGATIVE NEGATIVE Final   Influenza B by PCR NEGATIVE NEGATIVE Final    Comment: (NOTE) The Xpert Xpress SARS-CoV-2/FLU/RSV assay is intended as an aid in  the diagnosis of influenza from Nasopharyngeal swab specimens and  should not be used as a sole basis for treatment. Nasal washings and  aspirates are unacceptable for Xpert Xpress SARS-CoV-2/FLU/RSV  testing. Fact Sheet for Patients: https://www.moore.com/ Fact Sheet for Healthcare Providers: https://www.young.biz/ This test is not yet approved or cleared by the [place] FDA and  has been authorized for detection and/or diagnosis of SARS-CoV-2 by  FDA  under an Emergency Use Authorization (EUA). This EUA will remain  in effect (meaning this test can be used) for the duration of the  Covid-19 declaration under Section  564(b)(1) of the Act, 21  U.S.C. section 360bbb-3(b)(1), unless the authorization is  terminated or revoked. Performed at [facility], [address], [place], [place] [address]   Culture, blood (routine x 2)     Status: Abnormal   Collection Time: 01/14/20  9:57 PM   Specimen: BLOOD LEFT HAND  Result Value Ref Range Status   Specimen Description BLOOD LEFT HAND  Final   Special Requests   Final    BOTTLES DRAWN AEROBIC AND ANAEROBIC Blood Culture results may not be optimal due to an inadequate volume of blood received in culture bottles   Culture  Setup Time   Final    GRAM POSITIVE RODS ANAEROBIC BOTTLE ONLY CRITICAL RESULT CALLED TO, READ BACK BY AND VERIFIED WITH: G. ABBOTT,PHARMD [id number] 01/17/2020 T. TYSOR    Culture (A)  Final    ACTINOMYCES NAESLUNDII Standardized susceptibility testing for this organism is not available. Performed at [facility], [address], [place], [place] [address]    Report Status 01/19/2020 FINAL  Final  Culture, blood (routine x 2)     Status: None   Collection Time: 01/14/20 10:05 PM   Specimen: BLOOD  Result Value Ref Range Status   Specimen Description BLOOD LEFT ANTECUBITAL  Final   Special Requests   Final    BOTTLES DRAWN AEROBIC ONLY Blood Culture results may not be optimal due to an inadequate volume of blood received in culture bottles   Culture   Final    NO GROWTH 5 DAYS Performed at [facility], [address] [place], [place] [address]    Report Status 01/19/2020 FINAL  Final  MRSA PCR Screening     Status: None   Collection Time: 01/15/20  1:05 AM   Specimen: Nasal Mucosa; Nasopharyngeal  Result Value Ref Range Status   MRSA by PCR NEGATIVE NEGATIVE Final    Comment:        The GeneXpert MRSA Assay (FDA approved for NASAL specimens only), is one component of a comprehensive MRSA colonization surveillance program. It is not intended to diagnose MRSA infection nor to guide or monitor treatment  for MRSA infections. Performed at [facility], [address], [place], [place] [address]   Urine culture     Status: None   Collection Time: 01/15/20  2:19 AM   Specimen: Urine, Random  Result Value Ref Range Status   Specimen Description URINE, RANDOM  Final   Special Requests NONE  Final   Culture   Final    NO GROWTH Performed at [facility], [address] [place], [place] [address]    Report Status 01/16/2020 FINAL  Final    Studies/Results: CT PELVIS WO CONTRAST  Result Date: 01/23/2020 CLINICAL DATA:  Pelvic trauma bloody stools with known urethral injury EXAM: CT PELVIS WITHOUT CONTRAST TECHNIQUE: Multidetector CT imaging of the pelvis was performed following the standard protocol without intravenous contrast. Water-soluble contrast was infused into the existing Foley catheter. COMPARISON:  01/22/2020 FINDINGS: Urinary Tract: Foley catheter is inflated at the pelvic floor in the bulbar urethra. Urethral injury is confirmed with contrast outlining extraperitoneal spaces of the pelvis, extending along the bladder base and filling the rectum. Gas is present in the urinary bladder. There is ascites along the left and  right pericolic gutter. Volume of fluid similar to the recent comparison. Bowel: Vascular structures not well assessed given lack of intravenous contrast. Extensive calcified atherosclerotic changes of the distal abdominal aorta. No adenopathy in the pelvis. Reproductive: Prostate is not well evaluated. At the level of the mid prostate or suspected level of the mid prostate there contrast which extends outside of the area of the prostate extending around the expected location of the prostate as well, this raises the question of concomitant prosthetic urethral injury and injury to the prostate. This cannot be well delineated on the current study. Prostate could also be quite small and displaced to the right and anterior to the area of urethral injury. Other:   Ascites as above. Musculoskeletal: Spinal degenerative changes without acute or destructive bone process. IMPRESSION: 1. Urethral injury is confirmed with contrast outlining extraperitoneal spaces of the pelvis, extending along the bladder base, outlining and filling the rectum. 2. Urethral injury likely at the membranous portion of the urethra, likely at the level of the UG diaphragm. Prostate cannot be assessed on the current exam and may be displaced by extensive inflammation in the pelvis. 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 3. Gas is present in the urinary bladder. 4. There is ascites along the left and right pericolic gutter. Volume of fluid similar to the recent comparison. Patient has suspected liver disease. Continued correlation with creatinine levels may be helpful. There is no extension of contrast into the peritoneal cavity on today's exam though the urinary bladder is not opacified and remains distended. Urethral injury and existing Foley catheter likely contribute to bladder outlet obstruction. 5. Extensive calcified atherosclerotic changes of the distal abdominal aorta. 6. A call is out to the referring provider to further discuss findings in the above case. Aortic Atherosclerosis (ICD10-I70.0). Electronically Signed   By: Donzetta Kohut M.D.   On: 01/23/2020 12:49   US PELVIS LIMITED (TRANSABDOMINAL ONLY)  Result Date: 01/22/2020 CLINICAL DATA:  Foley catheter malpositioning EXAM: LIMITED ULTRASOUND OF PELVIS TECHNIQUE: Limited transabdominal ultrasound examination of the pelvis was performed. COMPARISON:  01/22/2020 FINDINGS: The urinary bladder is completely decompressed which limits evaluation. Transverse images demonstrate likely balloon from the Foley catheter within the decompressed bladder. IMPRESSION: 1. Severely limited study as the bladder is decompressed on this exam. Likely Foley catheter balloon within the decompressed bladder as above. If there is still concern for  Foley catheter malpositioning, repeat CT of the pelvis could be considered. Electronically Signed   By: Sharlet Salina M.D.   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 EXAM: CT-GUIDED PLACEMENT OF SUPRAPUBIC BLADDER CATHETER MEDICATIONS: Moderate sedation ANESTHESIA/SEDATION: Fentanyl 100 mcg IV; Versed 2.0 mg IV Moderate Sedation Time:  20 minutes The patient was continuously monitored during the procedure by the interventional radiology nurse under my direct supervision. CONTRAST:  None FLUOROSCOPY TIME:  None COMPLICATIONS: None immediate. PROCEDURE: The procedure was explained to the patient. The risks and benefits of the procedure were discussed and the patient's questions were addressed. Informed consent was obtained from the patient. Patient was placed supine on the CT scanner. CT images through the lower abdomen and pelvis were obtained. Anterior abdomen was marked. Skin was prepped and draped in sterile fashion. Maximal barrier sterile technique was utilized including caps, mask, sterile gowns, sterile gloves, sterile drape, hand hygiene and skin antiseptic. Skin was anesthetized with 1% lidocaine. Small incision was made. Using  CT guidance, 18 gauge trocar needle was directed into the urinary bladder. Super stiff Amplatz wire was advanced into the bladder. The tract was dilated to accommodate a 14 [place] multipurpose drain. Approximately 1.3 L of bloody urine was drained from the bladder. Catheter was sutured to skin. Bandage placed over the catheter. FINDINGS: Urinary bladder was severely distended with a small amount of gas. 6 French drain was successfully placed within the bladder. Approximately 1.3 L of bloody urine was drained from the bladder by the end of the procedure. IMPRESSION: Successful CT-guided placement of a suprapubic bladder catheter. Electronically Signed    By: Markus Daft M.D.   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 He is having pneumaturia--not unexpected. May need further  GS/rectal evaluation if persists.  *hgb 7.1 this am--no active bleeding. Will need to recheck in am  *w8ill need broad spectrum abx, at least in the short term     LOS: 10 days   Jorja Loa 01/24/2020, 6:42 PM

## 2020-01-24 NOTE — Progress Notes (Signed)
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 Anselm Pancoast. Patient awake and alert laying in bed. Asking to go home. Suprapubic catheter site c/d/i.   Allergies: Other  Medications: Prior to Admission medications   Medication Sig Start Date End Date Taking? Authorizing Provider  allopurinol (ZYLOPRIM) 300 MG tablet Take 300 mg by mouth daily. 11/16/15  Yes [provider]  busPIRone (BUSPAR) 10 MG tablet Take 10 mg by mouth 3 (three) times daily. 09/10/19  Yes [provider]  clindamycin (CLINDAGEL) 1 % gel Apply 1 application topically See admin instructions. Apply to bumps on face and body 1-2 times daily as needed. 12/06/19  Yes [provider]  ferrous sulfate 325 (65 FE) MG tablet Take 325 mg by mouth daily.   Yes [provider]  gabapentin (NEURONTIN) 600 MG tablet Take 600 mg by mouth 3 (three) times daily. 12/31/19  Yes [provider]  IBU 800 MG tablet Take 800 mg by mouth every 8 (eight) hours. 12/31/19  Yes [provider]  Magnesium 200 MG TABS Take 200 mg by mouth daily.   Yes [provider]  metoprolol succinate (TOPROL-XL) 25 MG 24 hr tablet Take 25 mg by mouth daily. 12/19/15  Yes [provider]  Milk Thistle Extract 175 MG TABS Take 175 mg by mouth daily.   Yes [provider]  Omega-3 1000 MG CAPS Take 1 capsule by mouth daily.   Yes [provider]  triamcinolone cream (KENALOG) 0.1 % Apply 1 application topically 2 (two) times daily as needed.  12/19/15  Yes [provider]  triamterene-hydrochlorothiazide (MAXZIDE-25) 37.5-25 MG tablet Take 1 tablet by mouth daily. 11/16/15  Yes [provider]  Vitamin D, Ergocalciferol, (DRISDOL)  1.25 MG (50000 UNIT) CAPS capsule Take 50,000 Units by mouth once a week. 12/31/19  Yes [provider]  amoxicillin (AMOXIL) 500 MG capsule Take 1 capsule (500 mg total) by mouth 3 (three) times daily. 01/21/20   Comer, Okey Regal, MD  amoxicillin (AMOXIL) 500 MG capsule Take 1 capsule (500 mg total) by mouth 3 (three) times daily. Do not fill until 02/17/20 01/21/20   Comer, Okey Regal, MD  blood glucose meter kit and supplies KIT Dispense based on patient and insurance preference. Use up to four times daily as directed. (FOR ICD-9 250.00, 250.01). 01/20/20   Antonieta Pert, MD  Insulin Pen Needle (PEN NEEDLES 3/16") 31G X 5 MM MISC USE WITH INSULIN PEN 01/20/20   Antonieta Pert, MD  tamsulosin (FLOMAX) 0.4 MG CAPS capsule Take 1 capsule (0.4 mg total) by mouth daily after supper. 01/20/20 02/19/20  Antonieta Pert, MD     Vital Signs: BP 120/74 (BP Location: Right Arm)   Pulse 98   Temp (!) 97.5 F (36.4 C) (Oral)   Resp 18   Ht _0  (1.753 m)   Wt 294 lb 8.6 oz (133.6 kg)   SpO2 100%   BMI 43.50 kg/m   Physical Exam Vitals and nursing note reviewed.  Constitutional:      General: He is not in acute distress.    Appearance: Normal appearance.  Pulmonary:     Effort: Pulmonary effort is normal. No respiratory distress.  Genitourinary:    Comments: Suprapubic catheter site soft without tenderness, erythema, drainage,  or active bleeding; approximately 150 cc clear gold urine in gravity bag. Skin:    General: Skin is warm and dry.  Neurological:     Mental Status: He is alert and oriented to person, place, and time.     Imaging: CT PELVIS WO CONTRAST  Result Date: 01/23/2020 CLINICAL DATA:  Pelvic trauma bloody stools with known urethral injury EXAM: CT PELVIS WITHOUT CONTRAST TECHNIQUE: Multidetector CT imaging of the pelvis was performed following the standard protocol without intravenous contrast. Water-soluble contrast was infused into the existing Foley catheter. COMPARISON:  01/22/2020  FINDINGS: Urinary Tract: Foley catheter is inflated at the pelvic floor in the bulbar urethra. Urethral injury is confirmed with contrast outlining extraperitoneal spaces of the pelvis, extending along the bladder base and filling the rectum. Gas is present in the urinary bladder. There is ascites along the left and right pericolic gutter. Volume of fluid similar to the recent comparison. Bowel: Vascular structures not well assessed given lack of intravenous contrast. Extensive calcified atherosclerotic changes of the distal abdominal aorta. No adenopathy in the pelvis. Reproductive: Prostate is not well evaluated. At the level of the mid prostate or suspected level of the mid prostate there contrast which extends outside of the area of the prostate extending around the expected location of the prostate as well, this raises the question of concomitant prosthetic urethral injury and injury to the prostate. This cannot be well delineated on the current study. Prostate could also be quite small and displaced to the right and anterior to the area of urethral injury. Other:  Ascites as above. Musculoskeletal: Spinal degenerative changes without acute or destructive bone process. IMPRESSION: 1. Urethral injury is confirmed with contrast outlining extraperitoneal spaces of the pelvis, extending along the bladder base, outlining and filling the rectum. 2. Urethral injury likely at the membranous portion of the urethra, likely at the level of the UG diaphragm. Prostate cannot be assessed on the current exam and may be displaced by extensive inflammation in the pelvis. 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 3. Gas is present in the urinary bladder. 4. There is ascites along the left and right pericolic gutter. Volume of fluid similar to the recent comparison. Patient has suspected liver disease. Continued correlation with creatinine levels may be helpful. There is no extension of contrast into the peritoneal  cavity on today's exam though the urinary bladder is not opacified and remains distended. Urethral injury and existing Foley catheter likely contribute to bladder outlet obstruction. 5. Extensive calcified atherosclerotic changes of the distal abdominal aorta. 6. A call is out to the referring provider to further discuss findings in the above case. Aortic Atherosclerosis (ICD10-I70.0). Electronically Signed   By: Zetta Bills M.D.   On: 01/23/2020 12:49   US PELVIS LIMITED (TRANSABDOMINAL ONLY)  Result Date: 01/22/2020 CLINICAL DATA:  Foley catheter malpositioning EXAM: LIMITED ULTRASOUND OF PELVIS TECHNIQUE: Limited transabdominal ultrasound examination of the pelvis was performed. COMPARISON:  01/22/2020 FINDINGS: The urinary bladder is completely decompressed which limits evaluation. Transverse images demonstrate likely balloon from the Foley catheter within the decompressed bladder. IMPRESSION: 1. Severely limited study as the bladder is decompressed on this exam. Likely Foley catheter balloon within the decompressed bladder as above. If there is still concern for Foley catheter malpositioning, repeat CT of the pelvis could be considered. Electronically Signed   By: Randa Ngo M.D.   On: 01/22/2020 22:36   US RENAL  Result Date: 01/20/2020 CLINICAL DATA:  Acute kidney injury. EXAM: RENAL /  URINARY TRACT ULTRASOUND COMPLETE COMPARISON:  Renal ultrasound dated January 15, 2020. FINDINGS: Right Kidney: Renal measurements: 14.1 x 7.1 x 6.1 cm = volume: 319 mL . Echogenicity within normal limits. No mass visualized. New mild hydronephrosis. Left Kidney: Renal measurements: 15.2 x 8.2 x 8.3 cm = volume: 541 mL. Echogenicity within normal limits. No mass or hydronephrosis visualized. Bladder: Distended bladder containing a small amount of debris. The Foley catheter balloon is not identified within the bladder. Other: Small perihepatic ascites. IMPRESSION: 1. New mild right hydronephrosis. 2. Distended  bladder without visualization of the Foley catheter balloon. Catheter repositioning recommended. Electronically Signed   By: Titus Dubin M.D.   On: 01/20/2020 19:02   CT RENAL STONE STUDY  Result Date: 01/22/2020 CLINICAL DATA:  Inpatient. Bladder mass. Bladder pain/fullness. EXAM: CT ABDOMEN AND PELVIS WITHOUT CONTRAST TECHNIQUE: Multidetector CT imaging of the abdomen and pelvis was performed following the standard protocol without IV contrast. COMPARISON:  01/20/2020 renal sonogram. FINDINGS: Lower chest: Two peripheral left lower lobe pulmonary nodules, largest 6 mm (series 4/image 9). Coronary atherosclerosis. Hepatobiliary: Diffuse hepatic steatosis. Slight hypertrophy of the lateral segment left liver lobe. Questionable fine liver surface irregularity, cannot exclude cirrhosis. No liver mass. Normal gallbladder with no radiopaque cholelithiasis. No biliary ductal dilatation. Pancreas: Normal, with no mass or duct dilation. Spleen: Normal size. No mass. Adrenals/Urinary Tract: Normal adrenals. No contour deforming renal masses. Mild bilateral hydroureteronephrosis to the level of the ureterovesical junctions bilaterally. No renal or ureteral stones. Prominently distended urinary bladder. Gas in the nondependent bladder. No bladder stones or wall thickening. Stomach/Bowel: Normal non-distended stomach. Normal caliber small bowel with no small bowel wall thickening. Normal appendix. Minimal left colonic diverticulosis with no definite large bowel wall thickening. Large bowel is largely collapsed. Vascular/Lymphatic: Atherosclerotic nonaneurysmal abdominal aorta. Small paraumbilical varix. Mild porta hepatis adenopathy up to 1.6 cm (series 3/image 31). No additional pathologically enlarged lymph nodes in the abdomen or pelvis. Reproductive: Foley catheter is malpositioned with Foley balloon distended within the bulbar urethra, with the tip of the Foley within right prostatic peripheral zone (series  3/image 88), probably outside of the urethra. There is scattered soft tissue emphysema surrounding the Foley balloon. Prostate size appears normal. Other: No pneumoperitoneum. Small volume ascites. No focal fluid collection. Musculoskeletal: No aggressive appearing focal osseous lesions. Marked lower lumbar spondylosis. IMPRESSION: 1. Foley catheter is malpositioned with Foley balloon distended within the bulbar urethra, with the tip of the Foley within the right prostatic peripheral zone, probably outside of the urethra. Scattered soft tissue emphysema surrounding the Foley balloon. 2. Evidence of bladder outlet obstruction with prominently distended bladder and mild bilateral hydroureteronephrosis. No urolithiasis. 3. Diffuse hepatic steatosis. Subtle morphologic changes in the liver suggestive of cirrhosis. No liver mass. Small volume ascites. Small paraumbilical varix. 4. Nonspecific mild porta hepatis adenopathy, potentially reactive. 5. Two left lower lobe pulmonary nodules, largest 6 mm. Non-contrast chest CT at 3-6 months is recommended. If the nodules are stable at time of repeat CT, then future CT at 18-24 months (from today's scan) is considered optional for low-risk patients, but is recommended for high-risk patients. This recommendation follows the consensus statement: Guidelines for Management of Incidental Pulmonary Nodules Detected on CT Images: From the Fleischner Society 2017; Radiology 2017; 284:228-243. 6. Coronary atherosclerosis. 7. Aortic Atherosclerosis (ICD10-I70.0). These results were called by telephone at the time of interpretation on 01/22/2020 at 3:04 pm to provider [facility] , who verbally acknowledged these results. Electronically Signed   By: Rinaldo Ratel  Poff M.D.   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 EXAM: CT-GUIDED PLACEMENT OF SUPRAPUBIC  BLADDER CATHETER MEDICATIONS: Moderate sedation ANESTHESIA/SEDATION: Fentanyl 100 mcg IV; Versed 2.0 mg IV Moderate Sedation Time:  20 minutes The patient was continuously monitored during the procedure by the interventional radiology nurse under my direct supervision. CONTRAST:  None FLUOROSCOPY TIME:  None COMPLICATIONS: None immediate. PROCEDURE: The procedure was explained to the patient. The risks and benefits of the procedure were discussed and the patient's questions were addressed. Informed consent was obtained from the patient. Patient was placed supine on the CT scanner. CT images through the lower abdomen and pelvis were obtained. Anterior abdomen was marked. Skin was prepped and draped in sterile fashion. Maximal barrier sterile technique was utilized including caps, mask, sterile gowns, sterile gloves, sterile drape, hand hygiene and skin antiseptic. Skin was anesthetized with 1% lidocaine. Small incision was made. Using CT guidance, 18 gauge trocar needle was directed into the urinary bladder. Super stiff Amplatz wire was advanced into the bladder. The tract was dilated to accommodate a 14 [place] multipurpose drain. Approximately 1.3 L of bloody urine was drained from the bladder. Catheter was sutured to skin. Bandage placed over the catheter. FINDINGS: Urinary bladder was severely distended with a small amount of gas. 43 French drain was successfully placed within the bladder. Approximately 1.3 L of bloody urine was drained from the bladder by the end of the procedure. IMPRESSION: Successful CT-guided placement of a suprapubic bladder catheter. Electronically Signed   By: Markus Daft M.D.   On: 01/24/2020 08:34    Labs:  CBC: Recent Labs    01/21/20 0523 01/22/20 0332 01/23/20 0420 01/24/20 0137  WBC 13.0* 8.7 9.9 8.9  HGB 9.9* 9.3* 9.4* 7.1*  HCT 28.5* 26.9* 27.4* 21.3*  PLT 167 140* 215 182    COAGS: Recent Labs    01/23/20 1643  INR 1.3*    BMP: Recent Labs     01/21/20 0523 01/22/20 0332 01/23/20 0420 01/24/20 0137  NA 138 137 136 138  K 3.5 3.6 3.7 4.0  CL 111 110 109 111  CO2 16* 15* 17* 17*  GLUCOSE 101* 104* 143* 109*  BUN 38* 35* 27* 22*  CALCIUM 8.2* 8.1* 8.4* 8.1*  CREATININE 1.66* 1.56* 1.49* 1.40*  GFRNONAA 49* 52* 55* 60*  GFRAA 56* >60 >60 >60    LIVER FUNCTION TESTS: Recent Labs    01/14/20 1930 01/17/20 1828 01/19/20 0203  BILITOT 3.3* 3.5* 3.7*  AST 29 24 35  ALT _0 ALKPHOS 185* 127* 134*  PROT 8.9* 7.6 7.8  ALBUMIN 2.5* 1.6* 1.4*    Assessment and Plan:  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 Anselm Pancoast. Suprapubic catheter stable. Tube to be managed by urology upon discharge. Further plans per [facility]/urology/CCM/[facility]S- appreciate and agree with management. Please call IR with questions/concerns.   Electronically Signed: Earley Abide, PA-C 01/24/2020, 10:10 AM   I spent a total of 15 Minutes at the the patient's bedside AND on the patient's hospital floor or unit, greater than 50% of which was counseling/coordinating care for urinary retention s/p suprapubic catheter placement.

## 2020-01-24 NOTE — Progress Notes (Signed)
PROGRESS NOTE    Glenn RUBY  Murphy:[id number] DOB: 02-10-1976 DOA: 01/14/2020 PCP: Gwenyth Bender, MD   Brief Narrative: 44 year old male with lifelong history of hidradenitis, diabetes mellitus, hypertension who Presented to the ED with complaint of thirst, hurting all over. lab results showed hyponatremia, metabolic acidosis, anion gap BS to be more than 8 blood sugar 734 creatinine 2.0 found to have AKI/DKA, anion gap metabolic acidosis and was admitted to ICU on the bicarb drip. Chest x-ray 4/6 clear lungs without pleural effusion, renal ultrasound 4/7 normal renal ultrasound no hydronephrosis.  Initially on vancomycin/Zosyn subsequently discontinued and briefly received ceftriaxone till 4/9.  Blood culture , 1 set of blood culture grows Actinomyces Naeslundii, urine cx negative.  Patient has been since weaned off insulin drip switched to long-acting and premeal insulin, off IV antibiotics and transferred to [facility] service 01/20/2020.   Patient with urinary retention, multiple instrumentation. 4/14, Foley catheter not working, malpositioned.  Multiple instrumentation at bedside.  Followed by urology. 4/15, traumatic catheterization, rectal perforation.  IR guided suprapubic catheter placement. Conservative management for rectal perforation.   Subjective:  Patient seen and examined.  Unhappy about all the events going on.  Suprapubic catheter working well. Still has Foley in place, minimal bloody discharge. Wants to go home. Hemoglobin 7.1.   Assessment & Plan:  #1 DKA/anion gap metabolic acidosis/type 2 diabetes mellitus poorly controlled A1c at 11.2. DKA resolved. Off iv insulin.  Patient was a started on long-acting insulin and prandial insulin, blood sugars low normal.  Currently on decreased doses of insulin.  Continue to monitor while in the hospital.  Will titrate according to the blood sugars. Recent Labs  Lab 01/23/20 1140 01/23/20 1702 01/23/20 2122 01/24/20 0805  01/24/20 1153  GLUCAP 89 145* 146* 85 119*   #2 urine retention/urethral injury/traumatic catheterization with rectal injury: Patient had difficult Foley catheterization and false passages. Followed by urology. Status post suprapubic catheter placement by IR, 4/15.   Seen by surgery, recommended conservative management. Mobility.  Monitor today in the hospital.  Antibiotic broadened to Zosyn to cover pelvic pathogens.  AKI, baseline creatinine 0.9 on 01/2016-unclear if progressive CKD in the setting of diabetes/hypertension: Creatinine downtrending.  We will continue to monitor.  Continue to improve. Recent Labs  Lab 01/20/20 0412 01/21/20 0523 01/22/20 0332 01/23/20 0420 01/24/20 0137  BUN 36* 38* 35* 27* 22*  CREATININE 1.47* 1.66* 1.56* 1.49* 1.40*   Essential hypertension/sinus tachycardia:  BP stable. Home metoprolol and Maxide are on hold.  Will resume metoprolol on discharge.  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 SEPSIS POA- now resolved.  Patient did receive broad spectrum antibiotic in ICU-given known hidradenitis suppurativa consistent with superinfection of his skin-discussed/consult infectious disease and now placed on amoxicillin to be continued for next 3 to 46-month. Amoxicillin on hold until on Zosyn.  Will resume on discharge.  Chronic hidradenitis, wound care on consult and hidradenitis lesions to bilateral upper back, continue wound care.  Morbid obesity with body mass index is 43.01 kg/m: Will benefit with weight loss and healthy lifestyle.  Anemia of acute blood loss: Patient had multiple procedures done.  Rectal bleeding.  Hemoglobin 7.1.  We will recheck tomorrow morning.  Patient does not want transfusion unless it is very critical.  DVT prophylaxis: SCDs. Code Status: full  Family Communication: plan of care discussed with patient and wife at bedside.  Status is: Inpatient Remains  inpatient appropriate because acute renal failure, management  of his diabetes and titration of his insulin Dispo: The patient is from: Home              Anticipated d/c is to: Home w HH PT, Rolling walker with 5" wheels;3in1, RN               Anticipated d/c date is: Anticipate discharge tomorrow.              Patient currently is not medically stable to d/c.  Urinary retention/hematuria hematochezia.   Nutrition: Diet Order            Diet Carb Modified Fluid consistency: Thin; Room service appropriate? Yes  Diet effective now        Diet Carb Modified            Consultants: PCCM, Urology Procedures:see note Microbiology:see note  Medications: Scheduled Meds: . Chlorhexidine Gluconate Cloth  6 each Topical Daily  . insulin aspart  0-15 Units Subcutaneous TID WC  . insulin aspart  0-5 Units Subcutaneous QHS  . insulin aspart  4 Units Subcutaneous TID WC  . insulin detemir  10 Units Subcutaneous BID  . mouth rinse  15 mL Mouth Rinse BID  . melatonin  3 mg Oral QHS  . sodium chloride flush  10-40 mL Intracatheter Q12H  . sodium chloride flush  10-40 mL Intracatheter Q12H  . tamsulosin  0.4 mg Oral QPC supper   Continuous Infusions: . sodium chloride Stopped (01/23/20 1503)  . lactated ringers 100 mL/hr at 01/24/20 1038  . piperacillin-tazobactam (ZOSYN)  IV 12.5 mL/hr at 01/24/20 0620    Antimicrobials: Anti-infectives (From admission, onward)   Start     Dose/Rate Route Frequency Ordered Stop   01/23/20 1430  piperacillin-tazobactam (ZOSYN) IVPB 3.375 g     3.375 g 12.5 mL/hr over 240 Minutes Intravenous Every 8 hours 01/23/20 1415     01/21/20 0930  amoxicillin (AMOXIL) capsule 500 mg  Status:  Discontinued     500 mg Oral Every 8 hours 01/21/20 0924 01/23/20 1415   01/21/20 0000  amoxicillin (AMOXIL) 500 MG capsule     500 mg Oral 3 times daily 01/21/20 0931     01/21/20 0000  amoxicillin (AMOXIL) 500 MG capsule     500 mg Oral 3 times daily 01/21/20 1042      01/15/20 2200  vancomycin (VANCOREADY) IVPB 1500 mg/300 mL  Status:  Discontinued     1,500 mg 150 mL/hr over 120 Minutes Intravenous Every 24 hours 01/14/20 2154 01/15/20 1019   01/15/20 1500  cefTRIAXone (ROCEPHIN) 2 g in sodium chloride 0.9 % 100 mL IVPB  Status:  Discontinued     2 g 200 mL/hr over 30 Minutes Intravenous Every 24 hours 01/15/20 1019 01/17/20 1114   01/15/20 0400  piperacillin-tazobactam (ZOSYN) IVPB 3.375 g  Status:  Discontinued     3.375 g 12.5 mL/hr over 240 Minutes Intravenous Every 8 hours 01/14/20 2154 01/15/20 1019   01/14/20 2200  piperacillin-tazobactam (ZOSYN) IVPB 3.375 g     3.375 g 100 mL/hr over 30 Minutes Intravenous  Once 01/14/20 2154 01/14/20 2339   01/14/20 2200  vancomycin (VANCOCIN) 2,500 mg in sodium chloride 0.9 % 500 mL IVPB     2,500 mg 250 mL/hr over 120 Minutes Intravenous  Once 01/14/20 2154 01/15/20 0402       Objective: Vitals: Today's Vitals   01/24/20 0530 01/24/20 0540 01/24/20 0610 01/24/20 0838  BP: 120/74     Pulse: 98  Resp: 18     Temp: (!) 97.5 F (36.4 C)     TempSrc: Oral     SpO2: 100%     Weight: 133.6 kg     Height:      PainSc:  7  Asleep 5     Intake/Output Summary (Last 24 hours) at 01/24/2020 1332 Last data filed at 01/24/2020 1154 Gross per 24 hour  Intake 2833.97 ml  Output 3200 ml  Net -366.03 ml   Filed Weights   01/21/20 0500 01/23/20 0509 01/24/20 0530  Weight: 132.5 kg 135.5 kg 133.6 kg   Weight change: -1.9 kg   Intake/Output from previous day: 04/15 0701 - 04/16 0700 In: 2597 [P.O.:120; I.V.:2322.2; IV Piggyback:154.8] Out: 3000 [Urine:3000] Intake/Output this shift: Total I/O In: 237 [P.O.:237] Out: 200 [Urine:200]  Examination:  Physical Exam  Constitutional: He is oriented to person, place, and time. He appears well-developed.  Patient anxious, sitting in couch, unhappy due to multiple procedures and painful conditions.  HENT:  Head: Normocephalic and atraumatic.  Eyes:  Pupils are equal, round, and reactive to light.  Cardiovascular: Normal rate and regular rhythm.  Respiratory: Effort normal.  GI: Soft.  Obese and pendulous.  Dark urine in the suprapubic catheter. Foley catheter is still on with minimal fresh blood.  Musculoskeletal:     Cervical back: Normal range of motion.  Neurological: He is alert and oriented to person, place, and time.    Data Reviewed: I have personally reviewed following labs and imaging studies CBC: Recent Labs  Lab 01/20/20 0412 01/21/20 0523 01/22/20 0332 01/23/20 0420 01/24/20 0137  WBC 11.1* 13.0* 8.7 9.9 8.9  NEUTROABS 8.0* 10.1* 6.2 8.6* 5.8  HGB 9.5* 9.9* 9.3* 9.4* 7.1*  HCT 26.7* 28.5* 26.9* 27.4* 21.3*  MCV 90.5 93.8 93.4 95.8 96.4  PLT 154 167 140* 215 182   Basic Metabolic Panel: Recent Labs  Lab 01/19/20 0203 01/19/20 0911 01/20/20 0412 01/21/20 0523 01/22/20 0332 01/23/20 0420 01/24/20 0137  NA 137   < > 139 138 137 136 138  K 3.0*   < > 3.3* 3.5 3.6 3.7 4.0  CL 110   < > 111 111 110 109 111  CO2 15*   < > 16* 16* 15* 17* 17*  GLUCOSE 214*   < > 107* 101* 104* 143* 109*  BUN 44*   < > 36* 38* 35* 27* 22*  CREATININE 1.84*   < > 1.47* 1.66* 1.56* 1.49* 1.40*  CALCIUM 7.5*   < > 7.7* 8.2* 8.1* 8.4* 8.1*  MG 2.1  --   --   --   --  1.9  --   PHOS 2.8  --   --   --   --  4.5  --    < > = values in this interval not displayed.   GFR: Estimated Creatinine Clearance: 89.4 mL/min (A) (by C-G formula based on SCr of 1.4 mg/dL (H)). Liver Function Tests: Recent Labs  Lab 01/17/20 1828 01/19/20 0203  AST 24 35  ALT 23 24  ALKPHOS 127* 134*  BILITOT 3.5* 3.7*  PROT 7.6 7.8  ALBUMIN 1.6* 1.4*   No results for input(s): LIPASE, AMYLASE in the last 168 hours. No results for input(s): AMMONIA in the last 168 hours. Coagulation Profile: Recent Labs  Lab 01/23/20 1643  INR 1.3*   Cardiac Enzymes: No results for input(s): CKTOTAL, CKMB, CKMBINDEX, TROPONINI in the last 168 hours. BNP  (last 3 results) No results for input(s): PROBNP  in the last 8760 hours. HbA1C: No results for input(s): HGBA1C in the last 72 hours. CBG: Recent Labs  Lab 01/23/20 1140 01/23/20 1702 01/23/20 2122 01/24/20 0805 01/24/20 1153  GLUCAP 89 145* 146* 85 119*   Lipid Profile: No results for input(s): CHOL, HDL, LDLCALC, TRIG, CHOLHDL, LDLDIRECT in the last 72 hours. Thyroid Function Tests: No results for input(s): TSH, T4TOTAL, FREET4, T3FREE, THYROIDAB in the last 72 hours. Anemia Panel: No results for input(s): VITAMINB12, FOLATE, FERRITIN, TIBC, IRON, RETICCTPCT in the last 72 hours. Sepsis Labs: No results for input(s): PROCALCITON, LATICACIDVEN in the last 168 hours.  Recent Results (from the past 240 hour(s))  Respiratory Panel by RT PCR (Flu A&B, Covid) - Nasopharyngeal Swab     Status: None   Collection Time: 01/14/20  8:22 PM   Specimen: Nasopharyngeal Swab  Result Value Ref Range Status   SARS Coronavirus 2 by RT PCR NEGATIVE NEGATIVE Final    Comment: (NOTE) SARS-CoV-2 target nucleic acids are NOT DETECTED. The SARS-CoV-2 RNA is generally detectable in upper respiratoy specimens during the acute phase of infection. The lowest concentration of SARS-CoV-2 viral copies this assay can detect is 131 copies/mL. A negative result does not preclude SARS-Cov-2 infection and should not be used as the sole basis for treatment or other patient management decisions. A negative result may occur with  improper specimen collection/handling, submission of specimen other than nasopharyngeal swab, presence of viral mutation(s) within the areas targeted by this assay, and inadequate number of viral copies (<131 copies/mL). A negative result must be combined with clinical observations, patient history, and epidemiological information. The expected result is Negative. Fact Sheet for Patients:  https://www.moore.com/ Fact Sheet for Healthcare Providers:   https://www.young.biz/ This test is not yet ap proved or cleared by the [place] FDA and  has been authorized for detection and/or diagnosis of SARS-CoV-2 by FDA under an Emergency Use Authorization (EUA). This EUA will remain  in effect (meaning this test can be used) for the duration of the COVID-19 declaration under Section 564(b)(1) of the Act, 21 U.S.C. section 360bbb-3(b)(1), unless the authorization is terminated or revoked sooner.    Influenza A by PCR NEGATIVE NEGATIVE Final   Influenza B by PCR NEGATIVE NEGATIVE Final    Comment: (NOTE) The Xpert Xpress SARS-CoV-2/FLU/RSV assay is intended as an aid in  the diagnosis of influenza from Nasopharyngeal swab specimens and  should not be used as a sole basis for treatment. Nasal washings and  aspirates are unacceptable for Xpert Xpress SARS-CoV-2/FLU/RSV  testing. Fact Sheet for Patients: https://www.moore.com/ Fact Sheet for Healthcare Providers: https://www.young.biz/ This test is not yet approved or cleared by the [place] FDA and  has been authorized for detection and/or diagnosis of SARS-CoV-2 by  FDA under an Emergency Use Authorization (EUA). This EUA will remain  in effect (meaning this test can be used) for the duration of the  Covid-19 declaration under Section 564(b)(1) of the Act, 21  U.S.C. section 360bbb-3(b)(1), unless the authorization is  terminated or revoked. Performed at [facility], [address], [place], [place] [address]   Culture, blood (routine x 2)     Status: Abnormal   Collection Time: 01/14/20  9:57 PM   Specimen: BLOOD LEFT HAND  Result Value Ref Range Status   Specimen Description BLOOD LEFT HAND  Final   Special Requests   Final    BOTTLES DRAWN AEROBIC AND ANAEROBIC Blood Culture results may not be optimal due to an inadequate  volume of blood received in culture bottles   Culture  Setup Time   Final    GRAM  POSITIVE RODS ANAEROBIC BOTTLE ONLY CRITICAL RESULT CALLED TO, READ BACK BY AND VERIFIED WITH: G. ABBOTT,PHARMD 0454 01/17/2020 T. TYSOR    Culture (A)  Final    ACTINOMYCES NAESLUNDII Standardized susceptibility testing for this organism is not available. Performed at [facility], [address], [place], [place] [address]    Report Status 01/19/2020 FINAL  Final  Culture, blood (routine x 2)     Status: None   Collection Time: 01/14/20 10:05 PM   Specimen: BLOOD  Result Value Ref Range Status   Specimen Description BLOOD LEFT ANTECUBITAL  Final   Special Requests   Final    BOTTLES DRAWN AEROBIC ONLY Blood Culture results may not be optimal due to an inadequate volume of blood received in culture bottles   Culture   Final    NO GROWTH 5 DAYS Performed at [facility], [address] [place], [place] [address]    Report Status 01/19/2020 FINAL  Final  MRSA PCR Screening     Status: None   Collection Time: 01/15/20  1:05 AM   Specimen: Nasal Mucosa; Nasopharyngeal  Result Value Ref Range Status   MRSA by PCR NEGATIVE NEGATIVE Final    Comment:        The GeneXpert MRSA Assay (FDA approved for NASAL specimens only), is one component of a comprehensive MRSA colonization surveillance program. It is not intended to diagnose MRSA infection nor to guide or monitor treatment for MRSA infections. Performed at [facility], [address] [place], [place] [address]   Urine culture     Status: None   Collection Time: 01/15/20  2:19 AM   Specimen: Urine, Random  Result Value Ref Range Status   Specimen Description URINE, RANDOM  Final   Special Requests NONE  Final   Culture   Final    NO GROWTH Performed at [facility], [address] [place], [place] [address]    Report Status 01/16/2020 FINAL  Final      Radiology Studies: CT PELVIS WO CONTRAST  Result Date: 01/23/2020 CLINICAL DATA:  Pelvic trauma bloody stools with known urethral injury  EXAM: CT PELVIS WITHOUT CONTRAST TECHNIQUE: Multidetector CT imaging of the pelvis was performed following the standard protocol without intravenous contrast. Water-soluble contrast was infused into the existing Foley catheter. COMPARISON:  01/22/2020 FINDINGS: Urinary Tract: Foley catheter is inflated at the pelvic floor in the bulbar urethra. Urethral injury is confirmed with contrast outlining extraperitoneal spaces of the pelvis, extending along the bladder base and filling the rectum. Gas is present in the urinary bladder. There is ascites along the left and right pericolic gutter. Volume of fluid similar to the recent comparison. Bowel: Vascular structures not well assessed given lack of intravenous contrast. Extensive calcified atherosclerotic changes of the distal abdominal aorta. No adenopathy in the pelvis. Reproductive: Prostate is not well evaluated. At the level of the mid prostate or suspected level of the mid prostate there contrast which extends outside of the area of the prostate extending around the expected location of the prostate as well, this raises the question of concomitant prosthetic urethral injury and injury to the prostate. This cannot be well delineated on the current study. Prostate could also be quite small and displaced to the right and anterior to the area of urethral injury. Other:  Ascites as above.  Musculoskeletal: Spinal degenerative changes without acute or destructive bone process. IMPRESSION: 1. Urethral injury is confirmed with contrast outlining extraperitoneal spaces of the pelvis, extending along the bladder base, outlining and filling the rectum. 2. Urethral injury likely at the membranous portion of the urethra, likely at the level of the UG diaphragm. Prostate cannot be assessed on the current exam and may be displaced by extensive inflammation in the pelvis. 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 3. Gas is present in the urinary bladder. 4. There is  ascites along the left and right pericolic gutter. Volume of fluid similar to the recent comparison. Patient has suspected liver disease. Continued correlation with creatinine levels may be helpful. There is no extension of contrast into the peritoneal cavity on today's exam though the urinary bladder is not opacified and remains distended. Urethral injury and existing Foley catheter likely contribute to bladder outlet obstruction. 5. Extensive calcified atherosclerotic changes of the distal abdominal aorta. 6. A call is out to the referring provider to further discuss findings in the above case. Aortic Atherosclerosis (ICD10-I70.0). Electronically Signed   By: Donzetta Kohut M.D.   On: 01/23/2020 12:49   US PELVIS LIMITED (TRANSABDOMINAL ONLY)  Result Date: 01/22/2020 CLINICAL DATA:  Foley catheter malpositioning EXAM: LIMITED ULTRASOUND OF PELVIS TECHNIQUE: Limited transabdominal ultrasound examination of the pelvis was performed. COMPARISON:  01/22/2020 FINDINGS: The urinary bladder is completely decompressed which limits evaluation. Transverse images demonstrate likely balloon from the Foley catheter within the decompressed bladder. IMPRESSION: 1. Severely limited study as the bladder is decompressed on this exam. Likely Foley catheter balloon within the decompressed bladder as above. If there is still concern for Foley catheter malpositioning, repeat CT of the pelvis could be considered. Electronically Signed   By: Sharlet Salina M.D.   On: 01/22/2020 22:36   CT RENAL STONE STUDY  Result Date: 01/22/2020 CLINICAL DATA:  Inpatient. Bladder mass. Bladder pain/fullness. EXAM: CT ABDOMEN AND PELVIS WITHOUT CONTRAST TECHNIQUE: Multidetector CT imaging of the abdomen and pelvis was performed following the standard protocol without IV contrast. COMPARISON:  01/20/2020 renal sonogram. FINDINGS: Lower chest: Two peripheral left lower lobe pulmonary nodules, largest 6 mm (series 4/image 9). Coronary  atherosclerosis. Hepatobiliary: Diffuse hepatic steatosis. Slight hypertrophy of the lateral segment left liver lobe. Questionable fine liver surface irregularity, cannot exclude cirrhosis. No liver mass. Normal gallbladder with no radiopaque cholelithiasis. No biliary ductal dilatation. Pancreas: Normal, with no mass or duct dilation. Spleen: Normal size. No mass. Adrenals/Urinary Tract: Normal adrenals. No contour deforming renal masses. Mild bilateral hydroureteronephrosis to the level of the ureterovesical junctions bilaterally. No renal or ureteral stones. Prominently distended urinary bladder. Gas in the nondependent bladder. No bladder stones or wall thickening. Stomach/Bowel: Normal non-distended stomach. Normal caliber small bowel with no small bowel wall thickening. Normal appendix. Minimal left colonic diverticulosis with no definite large bowel wall thickening. Large bowel is largely collapsed. Vascular/Lymphatic: Atherosclerotic nonaneurysmal abdominal aorta. Small paraumbilical varix. Mild porta hepatis adenopathy up to 1.6 cm (series 3/image 31). No additional pathologically enlarged lymph nodes in the abdomen or pelvis. Reproductive: Foley catheter is malpositioned with Foley balloon distended within the bulbar urethra, with the tip of the Foley within right prostatic peripheral zone (series 3/image 88), probably outside of the urethra. There is scattered soft tissue emphysema surrounding the Foley balloon. Prostate size appears normal. Other: No pneumoperitoneum. Small volume ascites. No focal fluid collection. Musculoskeletal: No aggressive appearing focal osseous lesions. Marked lower lumbar spondylosis. IMPRESSION: 1. Foley catheter is malpositioned  with Foley balloon distended within the bulbar urethra, with the tip of the Foley within the right prostatic peripheral zone, probably outside of the urethra. Scattered soft tissue emphysema surrounding the Foley balloon. 2. Evidence of bladder outlet  obstruction with prominently distended bladder and mild bilateral hydroureteronephrosis. No urolithiasis. 3. Diffuse hepatic steatosis. Subtle morphologic changes in the liver suggestive of cirrhosis. No liver mass. Small volume ascites. Small paraumbilical varix. 4. Nonspecific mild porta hepatis adenopathy, potentially reactive. 5. Two left lower lobe pulmonary nodules, largest 6 mm. Non-contrast chest CT at 3-6 months is recommended. If the nodules are stable at time of repeat CT, then future CT at 18-24 months (from today's scan) is considered optional for low-risk patients, but is recommended for high-risk patients. This recommendation follows the consensus statement: Guidelines for Management of Incidental Pulmonary Nodules Detected on CT Images: From the Fleischner Society 2017; Radiology 2017; 284:228-243. 6. Coronary atherosclerosis. 7. Aortic Atherosclerosis (ICD10-I70.0). These results were called by telephone at the time of interpretation on 01/22/2020 at 3:04 pm to provider [facility] , who verbally acknowledged these results. Electronically Signed   By: Delbert Phenix M.D.   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 EXAM: CT-GUIDED PLACEMENT OF SUPRAPUBIC BLADDER CATHETER MEDICATIONS: Moderate sedation ANESTHESIA/SEDATION: Fentanyl 100 mcg IV; Versed 2.0 mg IV Moderate Sedation Time:  20 minutes The patient was continuously monitored during the procedure by the interventional radiology nurse under my direct supervision. CONTRAST:  None FLUOROSCOPY TIME:  None COMPLICATIONS: None immediate. PROCEDURE: The procedure was explained to the patient. The risks and benefits of the procedure were discussed and the patient's questions were addressed. Informed consent was obtained from the patient. Patient was placed supine on the CT scanner. CT images through the lower abdomen  and pelvis were obtained. Anterior abdomen was marked. Skin was prepped and draped in sterile fashion. Maximal barrier sterile technique was utilized including caps, mask, sterile gowns, sterile gloves, sterile drape, hand hygiene and skin antiseptic. Skin was anesthetized with 1% lidocaine. Small incision was made. Using CT guidance, 18 gauge trocar needle was directed into the urinary bladder. Super stiff Amplatz wire was advanced into the bladder. The tract was dilated to accommodate a 14 [place] multipurpose drain. Approximately 1.3 L of bloody urine was drained from the bladder. Catheter was sutured to skin. Bandage placed over the catheter. FINDINGS: Urinary bladder was severely distended with a small amount of gas. 14 French drain was successfully placed within the bladder. Approximately 1.3 L of bloody urine was drained from the bladder by the end of the procedure. IMPRESSION: Successful CT-guided placement of a suprapubic bladder catheter. Electronically Signed   By: Richarda Overlie M.D.   On: 01/24/2020 08:34     LOS: 10 days    Total time spent: 30 minutes   Dorcas Carrow, MD [facility]  01/24/2020, 1:32 PM

## 2020-01-24 NOTE — Progress Notes (Signed)
OT Cancellation Note  Patient Details Name: Glenn Murphy MRN: [id number] DOB: 13-Oct-1975   Cancelled Treatment:    Reason Eval/Treat Not Completed: Patient declined, no reason specified. Pt reports "I just got back in the bed, I just did it", with regards to ADLs, mobility.  Spouse at side and reports assisting patient.  Declines further OT at this time.  Will follow and see as able.   Barry Brunner, OT [facility] Services Pager [phone] Office [phone]   Glenn Murphy 01/24/2020, 9:49 AM

## 2020-01-24 NOTE — Plan of Care (Signed)
  Problem: Education: Goal: Knowledge of General Education information will improve Description: Including pain rating scale, medication(s)/side effects and non-pharmacologic comfort measures Outcome: Progressing   Problem: Nutrition: Goal: Adequate nutrition will be maintained Outcome: Progressing   Problem: Elimination: Goal: Will not experience complications related to bowel motility Outcome: Progressing   Problem: Pain Managment: Goal: General experience of comfort will improve Outcome: Progressing   Problem: Safety: Goal: Ability to remain free from injury will improve Outcome: Progressing   

## 2020-01-25 DIAGNOSIS — R338 Other retention of urine: Secondary | ICD-10-CM | POA: Diagnosis not present

## 2020-01-25 DIAGNOSIS — S3660XA Unspecified injury of rectum, initial encounter: Secondary | ICD-10-CM | POA: Diagnosis not present

## 2020-01-25 DIAGNOSIS — E1110 Type 2 diabetes mellitus with ketoacidosis without coma: Secondary | ICD-10-CM | POA: Diagnosis not present

## 2020-01-25 LAB — CBC WITH DIFFERENTIAL/PLATELET
Abs Immature Granulocytes: 0.05 10*3/uL (ref 0.00–0.07)
Basophils Absolute: 0 10*3/uL (ref 0.0–0.1)
Basophils Relative: 1 %
Eosinophils Absolute: 0.1 10*3/uL (ref 0.0–0.5)
Eosinophils Relative: 1 %
HCT: 21.1 % — ABNORMAL LOW (ref 39.0–52.0)
Hemoglobin: 6.9 g/dL — CL (ref 13.0–17.0)
Immature Granulocytes: 1 %
Lymphocytes Relative: 35 %
Lymphs Abs: 2.2 10*3/uL (ref 0.7–4.0)
MCH: 32.9 pg (ref 26.0–34.0)
MCHC: 32.7 g/dL (ref 30.0–36.0)
MCV: 100.5 fL — ABNORMAL HIGH (ref 80.0–100.0)
Monocytes Absolute: 0.3 10*3/uL (ref 0.1–1.0)
Monocytes Relative: 5 %
Neutro Abs: 3.7 10*3/uL (ref 1.7–7.7)
Neutrophils Relative %: 57 %
Platelets: 193 10*3/uL (ref 150–400)
RBC: 2.1 MIL/uL — ABNORMAL LOW (ref 4.22–5.81)
RDW: 15.2 % (ref 11.5–15.5)
WBC: 6.4 10*3/uL (ref 4.0–10.5)
nRBC: 0 % (ref 0.0–0.2)

## 2020-01-25 LAB — BASIC METABOLIC PANEL
Anion gap: 10 (ref 5–15)
BUN: 17 mg/dL (ref 6–20)
CO2: 20 mmol/L — ABNORMAL LOW (ref 22–32)
Calcium: 8.1 mg/dL — ABNORMAL LOW (ref 8.9–10.3)
Chloride: 110 mmol/L (ref 98–111)
Creatinine, Ser: 1.28 mg/dL — ABNORMAL HIGH (ref 0.61–1.24)
GFR calc Af Amer: >60 mL/min (ref >60)
GFR calc non Af Amer: >60 mL/min (ref >60)
Glucose, Bld: 82 mg/dL (ref 70–99)
Potassium: 3.7 mmol/L (ref 3.5–5.1)
Sodium: 140 mmol/L (ref 135–145)

## 2020-01-25 LAB — GLUCOSE, CAPILLARY: Glucose-Capillary: 72 mg/dL (ref 70–99)

## 2020-01-25 MED ORDER — METRONIDAZOLE 500 MG PO TABS: 500.0000 mg | ORAL_TABLET | Freq: Three times a day (TID) | ORAL | 0 refills | Status: AC

## 2020-01-25 NOTE — Progress Notes (Signed)
CBG of 70, patient eating breakfast brought by family at this time. No symptoms reported. Will follow up.

## 2020-01-25 NOTE — Progress Notes (Signed)
Patient's wife Glenn Murphy) stated that she knows how to empty urine bag and advised to clean site daily with soap and water. Also , she claimed that she knows how to check patient's blood sugar and to give the right amount of Insulin to patient. She also knows how to administer insulin properly.

## 2020-01-25 NOTE — Progress Notes (Signed)
Patient ID: Glenn Murphy, male   DOB: 1976-10-07, 44 y.o.   MRN: [id number]       Subjective: Had a normal bowel movement  ROS: See above, otherwise other systems negative  Objective: Vital signs in last 24 hours: Temp:  [98.1 F (36.7 C)-98.7 F (37.1 C)] 98.1 F (36.7 C) (04/17 0640) Pulse Rate:  [92-99] 97 (04/17 0640) Resp:  [18-19] 18 (04/17 0640) BP: (107-124)/(65-76) 124/65 (04/17 0640) SpO2:  [100 %] 100 % (04/17 0640) Weight:  [137.4 kg] 137.4 kg (04/17 0500) Last BM Date: 01/24/20  Intake/Output from previous day: 04/16 0701 - 04/17 0700 In: 2886.8 [P.O.:1071; I.V.:1679.8; IV Piggyback:136] Out: 1050 [Urine:1050] Intake/Output this shift: No intake/output data recorded.  PE: Alert and cooperative  Lab Results:  Recent Labs    01/24/20 0137 01/25/20 0409  WBC 8.9 6.4  HGB 7.1* 6.9*  HCT 21.3* 21.1*  PLT 182 193   BMET Recent Labs    01/24/20 0137 01/25/20 0409  NA 138 140  K 4.0 3.7  CL 111 110  CO2 17* 20*  GLUCOSE 109* 82  BUN 22* 17  CREATININE 1.40* 1.28*  CALCIUM 8.1* 8.1*   PT/INR Recent Labs    01/23/20 1643  LABPROT 16.1*  INR 1.3*   CMP     Component Value Date/Time   NA 140 01/25/2020 0409   K 3.7 01/25/2020 0409   CL 110 01/25/2020 0409   CO2 20 (L) 01/25/2020 0409   GLUCOSE 82 01/25/2020 0409   BUN 17 01/25/2020 0409   CREATININE 1.28 (H) 01/25/2020 0409   CALCIUM 8.1 (L) 01/25/2020 0409   PROT 7.8 01/19/2020 0203   ALBUMIN 1.4 (L) 01/19/2020 0203   AST 35 01/19/2020 0203   ALT 24 01/19/2020 0203   ALKPHOS 134 (H) 01/19/2020 0203   BILITOT 3.7 (H) 01/19/2020 0203   GFRNONAA >60 01/25/2020 0409   GFRAA >60 01/25/2020 0409   Lipase     Component Value Date/Time   LIPASE 40 01/14/2020 1930       Studies/Results: CT PELVIS WO CONTRAST  Result Date: 01/23/2020 CLINICAL DATA:  Pelvic trauma bloody stools with known urethral injury EXAM: CT PELVIS WITHOUT CONTRAST TECHNIQUE: Multidetector CT imaging of  the pelvis was performed following the standard protocol without intravenous contrast. Water-soluble contrast was infused into the existing Foley catheter. COMPARISON:  01/22/2020 FINDINGS: Urinary Tract: Foley catheter is inflated at the pelvic floor in the bulbar urethra. Urethral injury is confirmed with contrast outlining extraperitoneal spaces of the pelvis, extending along the bladder base and filling the rectum. Gas is present in the urinary bladder. There is ascites along the left and right pericolic gutter. Volume of fluid similar to the recent comparison. Bowel: Vascular structures not well assessed given lack of intravenous contrast. Extensive calcified atherosclerotic changes of the distal abdominal aorta. No adenopathy in the pelvis. Reproductive: Prostate is not well evaluated. At the level of the mid prostate or suspected level of the mid prostate there contrast which extends outside of the area of the prostate extending around the expected location of the prostate as well, this raises the question of concomitant prosthetic urethral injury and injury to the prostate. This cannot be well delineated on the current study. Prostate could also be quite small and displaced to the right and anterior to the area of urethral injury. Other:  Ascites as above. Musculoskeletal: Spinal degenerative changes without acute or destructive bone process. IMPRESSION: 1. Urethral injury is confirmed with contrast outlining extraperitoneal spaces  of the pelvis, extending along the bladder base, outlining and filling the rectum. 2. Urethral injury likely at the membranous portion of the urethra, likely at the level of the UG diaphragm. Prostate cannot be assessed on the current exam and may be displaced by extensive inflammation in the pelvis. 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 3. Gas is present in the urinary bladder. 4. There is ascites along the left and right pericolic gutter. Volume of fluid similar  to the recent comparison. Patient has suspected liver disease. Continued correlation with creatinine levels may be helpful. There is no extension of contrast into the peritoneal cavity on today's exam though the urinary bladder is not opacified and remains distended. Urethral injury and existing Foley catheter likely contribute to bladder outlet obstruction. 5. Extensive calcified atherosclerotic changes of the distal abdominal aorta. 6. A call is out to the referring provider to further discuss findings in the above case. Aortic Atherosclerosis (ICD10-I70.0). Electronically Signed   By: Donzetta Kohut M.D.   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 EXAM: CT-GUIDED PLACEMENT OF SUPRAPUBIC BLADDER CATHETER MEDICATIONS: Moderate sedation ANESTHESIA/SEDATION: Fentanyl 100 mcg IV; Versed 2.0 mg IV Moderate Sedation Time:  20 minutes The patient was continuously monitored during the procedure by the interventional radiology nurse under my direct supervision. CONTRAST:  None FLUOROSCOPY TIME:  None COMPLICATIONS: None immediate. PROCEDURE: The procedure was explained to the patient. The risks and benefits of the procedure were discussed and the patient's questions were addressed. Informed consent was obtained from the patient. Patient was placed supine on the CT scanner. CT images through the lower abdomen and pelvis were obtained. Anterior abdomen was marked. Skin was prepped and draped in sterile fashion. Maximal barrier sterile technique was utilized including caps, mask, sterile gowns, sterile gloves, sterile drape, hand hygiene and skin antiseptic. Skin was anesthetized with 1% lidocaine. Small incision was made. Using CT guidance, 18 gauge trocar needle was directed into the urinary bladder. Super stiff Amplatz wire was advanced into the bladder. The tract was dilated to accommodate  a 14 [place] multipurpose drain. Approximately 1.3 L of bloody urine was drained from the bladder. Catheter was sutured to skin. Bandage placed over the catheter. FINDINGS: Urinary bladder was severely distended with a small amount of gas. 14 French drain was successfully placed within the bladder. Approximately 1.3 L of bloody urine was drained from the bladder by the end of the procedure. IMPRESSION: Successful CT-guided placement of a suprapubic bladder catheter. Electronically Signed   By: Richarda Overlie M.D.   On: 01/24/2020 08:34    Anti-infectives: Anti-infectives (From admission, onward)   Start     Dose/Rate Route Frequency Ordered Stop   01/23/20 1430  piperacillin-tazobactam (ZOSYN) IVPB 3.375 g     3.375 g 12.5 mL/hr over 240 Minutes Intravenous Every 8 hours 01/23/20 1415     01/21/20 0930  amoxicillin (AMOXIL) capsule 500 mg  Status:  Discontinued     500 mg Oral Every 8 hours 01/21/20 0924 01/23/20 1415   01/21/20 0000  amoxicillin (AMOXIL) 500 MG capsule     500 mg Oral 3 times daily 01/21/20 0931     01/21/20 0000  amoxicillin (AMOXIL) 500 MG capsule     500 mg Oral 3 times daily 01/21/20 1042     01/15/20 2200  vancomycin (VANCOREADY) IVPB 1500 mg/300 mL  Status:  Discontinued  1,500 mg 150 mL/hr over 120 Minutes Intravenous Every 24 hours 01/14/20 2154 01/15/20 1019   01/15/20 1500  cefTRIAXone (ROCEPHIN) 2 g in sodium chloride 0.9 % 100 mL IVPB  Status:  Discontinued     2 g 200 mL/hr over 30 Minutes Intravenous Every 24 hours 01/15/20 1019 01/17/20 1114   01/15/20 0400  piperacillin-tazobactam (ZOSYN) IVPB 3.375 g  Status:  Discontinued     3.375 g 12.5 mL/hr over 240 Minutes Intravenous Every 8 hours 01/14/20 2154 01/15/20 1019   01/14/20 2200  piperacillin-tazobactam (ZOSYN) IVPB 3.375 g     3.375 g 100 mL/hr over 30 Minutes Intravenous  Once 01/14/20 2154 01/14/20 2339   01/14/20 2200  vancomycin (VANCOCIN) 2,500 mg in sodium chloride 0.9 % 500 mL IVPB     2,500  mg 250 mL/hr over 120 Minutes Intravenous  Once 01/14/20 2154 01/15/20 0402       Assessment/Plan DM Morbid obesity Hidradenitis  Rectourethral fistula with BRBPR -hgb stable today, no further bleeding.  If this recurs may need EUA vs GI for flex sig to see what is bleeding and to correct it. -this is likely self-limited and will resolved on its own, the bleeding and the fistula. -no surgical intervention at this time. -will defer to medicine on need for transfusion.   FEN - carb mod diet VTE - per primary service ID - [email or web address]   LOS: 11 days    Berna Bue , MD [facility] 01/25/2020, 8:27 AM Please see Amion for pager number during day hours 7:00am-4:30pm or 7:00am -11:30am on weekends

## 2020-01-25 NOTE — Discharge Summary (Signed)
Physician Discharge Summary  Glenn Murphy LFY:[id number] DOB: 22-Jun-1976 DOA: 01/14/2020  PCP: Glenn Blocker, MD  Admit date: 01/14/2020 Discharge date: 01/25/2020  Admitted From: home  Disposition:  Home with home health   Recommendations for Outpatient Follow-up:  1. Follow up with PCP in 1-2 weeks 2. Please obtain BMP/CBC in one week Follow-up with urology as scheduled, otherwise call for follow-up. Suprapubic catheter care as instructed.  Home Health: PT/OT Equipment/Devices: Rollator walker/bedside commode  Discharge Condition: Stable CODE STATUS: Full code Diet recommendation: Low-carb, low-salt diet  Discharge summary: 44 year old gentleman with history of hidradenitis on prolonged antibiotics, type 2 diabetes recently on Metformin and hypertension who presented to the emergency room with complaints of thirst, myalgia.  Lab results showed hyponatremia, metabolic acidosis, anion gap with blood sugars 734 and creatinine of 2.  He was admitted to ICU and treated as AKI/diabetic ketoacidosis and anion gap metabolic acidosis.  Initially treated with antibiotics.  Patient was weaned off from insulin drip and started on subcu insulin and transferred to medical floor.  Apparently, he had episode of urinary retention needing multiple instrumentation that resulted in false passages and traumatic catheterization as well as rectal perforation.  Had multiple issues that are presented along with his plan of care and future follow-up as below:  Diabetic ketoacidosis/anion gap metabolic acidosis/type 2 diabetes with hyperglycemia, hemoglobin A1c 11.2: Blood sugars fairly stable.  Started on twice a day long-acting insulin and prandial insulin.  Discontinue Metformin.  Patient and wife well educated and able to administer insulin, they were able to know hypoglycemic episodes and treatment.  Comfortable with insulin management plan at home.  Urinary retention/urethral injury/traumatic  catheterization with rectal injury: Patient had difficult Foley catheterization and false passages.  Was followed by urology.  Also started having rectal bleeding.  CT urogram showed perforation. Urology recommended suprapubic catheter drain, suprapubic catheter was placed by interventional radiology which is functioning well. Followed by surgery, remained stable, they thought it is a minor perforation and suggested conservative management.  Has been having normal bowel movements without trouble. Patient is already on amoxicillin for long-term management for his hidradenitis. For pelvic infection prevention, will add Flagyl for 7 more days. Patient was extensively educated to come back for any problems.  If persistent bleeding or pelvic pain, they may need further surgical intervention.  At this time, patient willing to go home and not willing to stay in the hospital.  Actinomyces positive blood cultures, sepsis present on admission resolved on discharge: Previously treated with broad-spectrum antibiotics.  Seen by infectious disease who recommended amoxicillin for 3 months.  Flagyl added for pelvic infection prevention.  Anemia of acute blood loss: Had multiple procedures and urethral and rectal bleeding.  Hemoglobin was 7.1-6.9 subsequently last 24 hours.  No clinical evidence of active bleeding.  Patient declined blood transfusion. With no evidence of active bleeding, he will resume his iron supplements. Will need to repeat CBC in about 1 week to ensure stabilization.  His renal functions slightly abnormal but they are at his baseline.  Patient wanting to go home today with home health PT/OT and devices with his wife.   Discharge Diagnoses:  Principal Problem:   DKA (diabetic ketoacidoses) ([place]) Active Problems:   Acute urinary retention   Rectal injury, initial encounter    Discharge Instructions  Discharge Instructions    Call MD for:  redness, tenderness, or signs of infection  (pain, swelling, redness, odor or green/yellow discharge around incision site)   Complete by:  As directed    Call MD for:  severe uncontrolled pain   Complete by: As directed    Diet - low sodium heart healthy   Complete by: As directed    Diet Carb Modified   Complete by: As directed    Discharge instructions   Complete by: As directed    Check blood sugar 3 times a day and bedtime at home. If blood sugar running above 200 less than 70 please call your MD to adjust insulin. If blood sugars running less 100 do not use insulin and call MD. If you noticed signs and symptoms of hypoglycemia or low blood sugar like jitteriness, confusion, thirst, tremor, sweating- Check blood sugar, drink sugary drink/biscuits/sweets to increase sugar level and call MD or return to ER.   Discharge instructions   Complete by: As directed    Call your doctor for any fresh bleeding in stool or urine. Avoid constipation, take over the counter laxatives if needed to have smooth bowel movements. Needs follow up blood test with your doctor as well you will need a follow up with Urology in 2-3 weeks.   Increase activity slowly   Complete by: As directed    Increase activity slowly   Complete by: As directed      Allergies as of 01/25/2020      Reactions   Other Other (See Comments)   Itchy throat-strawberries,watermelon      Medication List    STOP taking these medications   IBU 800 MG tablet Generic drug: ibuprofen   triamterene-hydrochlorothiazide 37.5-25 MG tablet Commonly known as: MAXZIDE-25     TAKE these medications   allopurinol 300 MG tablet Commonly known as: ZYLOPRIM Take 300 mg by mouth daily.   amoxicillin 500 MG capsule Commonly known as: AMOXIL Take 1 capsule (500 mg total) by mouth 3 (three) times daily.   amoxicillin 500 MG capsule Commonly known as: AMOXIL Take 1 capsule (500 mg total) by mouth 3 (three) times daily. Do not fill until 02/17/20   blood glucose meter kit and  supplies Kit Dispense based on patient and insurance preference. Use up to four times daily as directed. (FOR ICD-9 250.00, 250.01).   busPIRone 10 MG tablet Commonly known as: BUSPAR Take 10 mg by mouth 3 (three) times daily.   clindamycin 1 % gel Commonly known as: CLINDAGEL Apply 1 application topically See admin instructions. Apply to bumps on face and body 1-2 times daily as needed.   ferrous sulfate 325 (65 FE) MG tablet Take 325 mg by mouth daily.   gabapentin 600 MG tablet Commonly known as: NEURONTIN Take 600 mg by mouth 3 (three) times daily.   Lantus SoloStar 100 UNIT/ML Solostar Pen Generic drug: insulin glargine Inject 10 Units into the skin 2 (two) times daily.   Magnesium 200 MG Tabs Take 200 mg by mouth daily.   metoprolol succinate 25 MG 24 hr tablet Commonly known as: TOPROL-XL Take 25 mg by mouth daily.   metroNIDAZOLE 500 MG tablet Commonly known as: Flagyl Take 1 tablet (500 mg total) by mouth 3 (three) times daily for 7 days.   Milk Thistle Extract 175 MG Tabs Take 175 mg by mouth daily.   NovoLOG FlexPen 100 UNIT/ML FlexPen Generic drug: insulin aspart Inject 4 Units into the skin 3 (three) times daily with meals.   Omega-3 1000 MG Caps Take 1 capsule by mouth daily.   Pen Needles 3/16" 31G X 5 MM Misc USE WITH INSULIN PEN  tamsulosin 0.4 MG Caps capsule Commonly known as: FLOMAX Take 1 capsule (0.4 mg total) by mouth daily after supper.   triamcinolone cream 0.1 % Commonly known as: KENALOG Apply 1 application topically 2 (two) times daily as needed.   Vitamin D (Ergocalciferol) 1.25 MG (50000 UNIT) Caps capsule Commonly known as: DRISDOL Take 50,000 Units by mouth once a week.            Durable Medical Equipment  (From admission, onward)         Start     Ordered   01/21/20 1646  For home use only DME Walker rolling  Once    Question Answer Comment  Walker: With 5 Inch Wheels   Patient needs a walker to treat with the  following condition Weakness      01/21/20 1645   01/21/20 1646  For home use only DME 3 n 1  Once     01/21/20 1645         Follow-up Information    Robley Fries, MD Follow up in 2 week(s).   Specialty: Urology Why: call 4/19 to make appointment  Contact information: [place] [address] [phone]        Glenn Blocker, MD Follow up in 1 week(s).   Specialty: Internal Medicine Why: needs CBC and BMP  Contact information: [address] [place] [address] [phone]        [facility] Follow up.   Specialty: Home Health Services Why: home health  Contact information: [address] [place]  [address] [phone]          Allergies  Allergen Reactions  . Other Other (See Comments)    Itchy throat-strawberries,watermelon    Consultations:  Urology  Surgery  Infectious disease  Intervention radiology   Procedures/Studies: DG Chest 2 View  Result Date: 01/14/2020 CLINICAL DATA:  Shortness of breath. EXAM: CHEST - 2 VIEW COMPARISON:  None. FINDINGS: The heart size and mediastinal contours are within normal limits. Both lungs are clear. No pneumothorax or pleural effusion is noted. The visualized skeletal structures are unremarkable. IMPRESSION: No active cardiopulmonary disease. Electronically Signed   By: Marijo Conception M.D.   On: 01/14/2020 15:38   CT PELVIS WO CONTRAST  Result Date: 01/23/2020 CLINICAL DATA:  Pelvic trauma bloody stools with known urethral injury EXAM: CT PELVIS WITHOUT CONTRAST TECHNIQUE: Multidetector CT imaging of the pelvis was performed following the standard protocol without intravenous contrast. Water-soluble contrast was infused into the existing Foley catheter. COMPARISON:  01/22/2020 FINDINGS: Urinary Tract: Foley catheter is inflated at the pelvic floor in the bulbar urethra. Urethral injury is confirmed with contrast outlining extraperitoneal spaces  of the pelvis, extending along the bladder base and filling the rectum. Gas is present in the urinary bladder. There is ascites along the left and right pericolic gutter. Volume of fluid similar to the recent comparison. Bowel: Vascular structures not well assessed given lack of intravenous contrast. Extensive calcified atherosclerotic changes of the distal abdominal aorta. No adenopathy in the pelvis. Reproductive: Prostate is not well evaluated. At the level of the mid prostate or suspected level of the mid prostate there contrast which extends outside of the area of the prostate extending around the expected location of the prostate as well, this raises the question of concomitant prosthetic urethral injury and injury to the prostate. This cannot be well delineated on the current study. Prostate could also be  quite small and displaced to the right and anterior to the area of urethral injury. Other:  Ascites as above. Musculoskeletal: Spinal degenerative changes without acute or destructive bone process. IMPRESSION: 1. Urethral injury is confirmed with contrast outlining extraperitoneal spaces of the pelvis, extending along the bladder base, outlining and filling the rectum. 2. Urethral injury likely at the membranous portion of the urethra, likely at the level of the UG diaphragm. Prostate cannot be assessed on the current exam and may be displaced by extensive inflammation in the pelvis. 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 3. Gas is present in the urinary bladder. 4. There is ascites along the left and right pericolic gutter. Volume of fluid similar to the recent comparison. Patient has suspected liver disease. Continued correlation with creatinine levels may be helpful. There is no extension of contrast into the peritoneal cavity on today's exam though the urinary bladder is not opacified and remains distended. Urethral injury and existing Foley catheter likely contribute to bladder outlet  obstruction. 5. Extensive calcified atherosclerotic changes of the distal abdominal aorta. 6. A call is out to the referring provider to further discuss findings in the above case. Aortic Atherosclerosis (ICD10-I70.0). Electronically Signed   By: Zetta Bills M.D.   On: 01/23/2020 12:49   US PELVIS LIMITED (TRANSABDOMINAL ONLY)  Result Date: 01/22/2020 CLINICAL DATA:  Foley catheter malpositioning EXAM: LIMITED ULTRASOUND OF PELVIS TECHNIQUE: Limited transabdominal ultrasound examination of the pelvis was performed. COMPARISON:  01/22/2020 FINDINGS: The urinary bladder is completely decompressed which limits evaluation. Transverse images demonstrate likely balloon from the Foley catheter within the decompressed bladder. IMPRESSION: 1. Severely limited study as the bladder is decompressed on this exam. Likely Foley catheter balloon within the decompressed bladder as above. If there is still concern for Foley catheter malpositioning, repeat CT of the pelvis could be considered. Electronically Signed   By: Randa Ngo M.D.   On: 01/22/2020 22:36   US RENAL  Result Date: 01/20/2020 CLINICAL DATA:  Acute kidney injury. EXAM: RENAL / URINARY TRACT ULTRASOUND COMPLETE COMPARISON:  Renal ultrasound dated January 15, 2020. FINDINGS: Right Kidney: Renal measurements: 14.1 x 7.1 x 6.1 cm = volume: 319 mL . Echogenicity within normal limits. No mass visualized. New mild hydronephrosis. Left Kidney: Renal measurements: 15.2 x 8.2 x 8.3 cm = volume: 541 mL. Echogenicity within normal limits. No mass or hydronephrosis visualized. Bladder: Distended bladder containing a small amount of debris. The Foley catheter balloon is not identified within the bladder. Other: Small perihepatic ascites. IMPRESSION: 1. New mild right hydronephrosis. 2. Distended bladder without visualization of the Foley catheter balloon. Catheter repositioning recommended. Electronically Signed   By: Titus Dubin M.D.   On: 01/20/2020 19:02   US  RENAL  Result Date: 01/15/2020 CLINICAL DATA:  Initial evaluation for urinary retention. EXAM: RENAL / URINARY TRACT ULTRASOUND COMPLETE COMPARISON:  None available. FINDINGS: Right Kidney: Renal measurements: 14.0 x 7.1 x 5.8 cm = volume: 271.5 mL. Echogenicity within normal limits. No nephrolithiasis or hydronephrosis. No focal renal mass. Left Kidney: Renal measurements: 15.8 x 7.9 x 4.8 cm = volume: 317.5 mL. Echogenicity within normal limits. No nephrolithiasis or hydronephrosis. No focal renal mass. Bladder: Decompressed with a Foley catheter in place. Other: None. IMPRESSION: Normal renal ultrasound. No hydronephrosis or other significant finding. Electronically Signed   By: Jeannine Boga M.D.   On: 01/15/2020 18:41   CT RENAL STONE STUDY  Result Date: 01/22/2020 CLINICAL DATA:  Inpatient. Bladder mass. Bladder pain/fullness. EXAM: CT  ABDOMEN AND PELVIS WITHOUT CONTRAST TECHNIQUE: Multidetector CT imaging of the abdomen and pelvis was performed following the standard protocol without IV contrast. COMPARISON:  01/20/2020 renal sonogram. FINDINGS: Lower chest: Two peripheral left lower lobe pulmonary nodules, largest 6 mm (series 4/image 9). Coronary atherosclerosis. Hepatobiliary: Diffuse hepatic steatosis. Slight hypertrophy of the lateral segment left liver lobe. Questionable fine liver surface irregularity, cannot exclude cirrhosis. No liver mass. Normal gallbladder with no radiopaque cholelithiasis. No biliary ductal dilatation. Pancreas: Normal, with no mass or duct dilation. Spleen: Normal size. No mass. Adrenals/Urinary Tract: Normal adrenals. No contour deforming renal masses. Mild bilateral hydroureteronephrosis to the level of the ureterovesical junctions bilaterally. No renal or ureteral stones. Prominently distended urinary bladder. Gas in the nondependent bladder. No bladder stones or wall thickening. Stomach/Bowel: Normal non-distended stomach. Normal caliber small bowel with no small  bowel wall thickening. Normal appendix. Minimal left colonic diverticulosis with no definite large bowel wall thickening. Large bowel is largely collapsed. Vascular/Lymphatic: Atherosclerotic nonaneurysmal abdominal aorta. Small paraumbilical varix. Mild porta hepatis adenopathy up to 1.6 cm (series 3/image 31). No additional pathologically enlarged lymph nodes in the abdomen or pelvis. Reproductive: Foley catheter is malpositioned with Foley balloon distended within the bulbar urethra, with the tip of the Foley within right prostatic peripheral zone (series 3/image 88), probably outside of the urethra. There is scattered soft tissue emphysema surrounding the Foley balloon. Prostate size appears normal. Other: No pneumoperitoneum. Small volume ascites. No focal fluid collection. Musculoskeletal: No aggressive appearing focal osseous lesions. Marked lower lumbar spondylosis. IMPRESSION: 1. Foley catheter is malpositioned with Foley balloon distended within the bulbar urethra, with the tip of the Foley within the right prostatic peripheral zone, probably outside of the urethra. Scattered soft tissue emphysema surrounding the Foley balloon. 2. Evidence of bladder outlet obstruction with prominently distended bladder and mild bilateral hydroureteronephrosis. No urolithiasis. 3. Diffuse hepatic steatosis. Subtle morphologic changes in the liver suggestive of cirrhosis. No liver mass. Small volume ascites. Small paraumbilical varix. 4. Nonspecific mild porta hepatis adenopathy, potentially reactive. 5. Two left lower lobe pulmonary nodules, largest 6 mm. Non-contrast chest CT at 3-6 months is recommended. If the nodules are stable at time of repeat CT, then future CT at 18-24 months (from today's scan) is considered optional for low-risk patients, but is recommended for high-risk patients. This recommendation follows the consensus statement: Guidelines for Management of Incidental Pulmonary Nodules Detected on CT Images:  From the Fleischner Society 2017; Radiology 2017; 284:228-243. 6. Coronary atherosclerosis. 7. Aortic Atherosclerosis (ICD10-I70.0). These results were called by telephone at the time of interpretation on 01/22/2020 at 3:04 pm to provider [facility] , who verbally acknowledged these results. Electronically Signed   By: Ilona Sorrel M.D.   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 EXAM: CT-GUIDED PLACEMENT OF SUPRAPUBIC BLADDER CATHETER MEDICATIONS: Moderate sedation ANESTHESIA/SEDATION: Fentanyl 100 mcg IV; Versed 2.0 mg IV Moderate Sedation Time:  20 minutes The patient was continuously monitored during the procedure by the interventional radiology nurse under my direct supervision. CONTRAST:  None FLUOROSCOPY TIME:  None COMPLICATIONS: None immediate. PROCEDURE: The procedure was explained to the patient. The risks and benefits of the procedure were discussed and the patient's questions were addressed. Informed consent was obtained from the patient. Patient was placed supine on the CT scanner. CT images through the lower abdomen and pelvis were obtained. Anterior abdomen was marked. Skin was prepped and draped  in sterile fashion. Maximal barrier sterile technique was utilized including caps, mask, sterile gowns, sterile gloves, sterile drape, hand hygiene and skin antiseptic. Skin was anesthetized with 1% lidocaine. Small incision was made. Using CT guidance, 18 gauge trocar needle was directed into the urinary bladder. Super stiff Amplatz wire was advanced into the bladder. The tract was dilated to accommodate a 14 [place] multipurpose drain. Approximately 1.3 L of bloody urine was drained from the bladder. Catheter was sutured to skin. Bandage placed over the catheter. FINDINGS: Urinary bladder was severely distended with a small amount of gas. 27 French drain was  successfully placed within the bladder. Approximately 1.3 L of bloody urine was drained from the bladder by the end of the procedure. IMPRESSION: Successful CT-guided placement of a suprapubic bladder catheter. Electronically Signed   By: Markus Daft M.D.   On: 01/24/2020 08:34     Subjective: Patient seen and examined.  Wife at the bedside.  Eager to go home.  Some pain on the rectal area however nothing like before. Had one episode of old blood with his bowel movement yesterday as per patient.  Had 2 subsequent bowel movement after that and patient and wife both did not notice any blood. "I want to go home and heal"   Discharge Exam: Vitals:   01/24/20 2017 01/25/20 0640  BP: 107/76 124/65  Pulse: 99 97  Resp: 18 18  Temp: 98.3 F (36.8 C) 98.1 F (36.7 C)  SpO2: 100% 100%   Vitals:   01/24/20 1600 01/24/20 2017 01/25/20 0500 01/25/20 0640  BP:  107/76  124/65  Pulse:  99  97  Resp: '19 18  18  '$ Temp:  98.3 F (36.8 C)  98.1 F (36.7 C)  TempSrc:  Oral  Oral  SpO2:  100%  100%  Weight:   (!) 137.4 kg   Height:        General: Pt is alert, awake, not in acute distress on room air. Cardiovascular: RRR, S1/S2 +, no rubs, no gallops Respiratory: CTA bilaterally, no wheezing, no rhonchi Abdominal: Soft, NT, ND, bowel sounds +, suprapubic catheter intact.  Obese and pendulous.  No localized tenderness. Extremities: no edema, no cyanosis    The results of significant diagnostics from this hospitalization (including imaging, microbiology, ancillary and laboratory) are listed below for reference.     Microbiology: No results found for this or any previous visit (from the past 240 hour(s)).   Labs: BNP (last 3 results) Recent Labs    01/14/20 1525  BNP 633.3*   Basic Metabolic Panel: Recent Labs  Lab 01/19/20 0203 01/19/20 0911 01/21/20 0523 01/22/20 0332 01/23/20 0420 01/24/20 0137 01/25/20 0409  NA 137   < > 138 137 136 138 140  K 3.0*   < > 3.5 3.6 3.7 4.0  3.7  CL 110   < > 111 110 109 111 110  CO2 15*   < > 16* 15* 17* 17* 20*  GLUCOSE 214*   < > 101* 104* 143* 109* 82  BUN 44*   < > 38* 35* 27* 22* 17  CREATININE 1.84*   < > 1.66* 1.56* 1.49* 1.40* 1.28*  CALCIUM 7.5*   < > 8.2* 8.1* 8.4* 8.1* 8.1*  MG 2.1  --   --   --  1.9  --   --   PHOS 2.8  --   --   --  4.5  --   --    < > = values in  this interval not displayed.   Liver Function Tests: Recent Labs  Lab 01/19/20 0203  AST 35  ALT 24  ALKPHOS 134*  BILITOT 3.7*  PROT 7.8  ALBUMIN 1.4*   No results for input(s): LIPASE, AMYLASE in the last 168 hours. No results for input(s): AMMONIA in the last 168 hours. CBC: Recent Labs  Lab 01/21/20 0523 01/22/20 0332 01/23/20 0420 01/24/20 0137 01/25/20 0409  WBC 13.0* 8.7 9.9 8.9 6.4  NEUTROABS 10.1* 6.2 8.6* 5.8 3.7  HGB 9.9* 9.3* 9.4* 7.1* 6.9*  HCT 28.5* 26.9* 27.4* 21.3* 21.1*  MCV 93.8 93.4 95.8 96.4 100.5*  PLT 167 140* 215 182 193   Cardiac Enzymes: No results for input(s): CKTOTAL, CKMB, CKMBINDEX, TROPONINI in the last 168 hours. BNP: Invalid input(s): POCBNP CBG: Recent Labs  Lab 01/24/20 0805 01/24/20 1153 01/24/20 1722 01/24/20 2200 01/25/20 0750  GLUCAP 85 119* 138* 113* 72   D-Dimer No results for input(s): DDIMER in the last 72 hours. Hgb A1c No results for input(s): HGBA1C in the last 72 hours. Lipid Profile No results for input(s): CHOL, HDL, LDLCALC, TRIG, CHOLHDL, LDLDIRECT in the last 72 hours. Thyroid function studies No results for input(s): TSH, T4TOTAL, T3FREE, THYROIDAB in the last 72 hours.  Invalid input(s): FREET3 Anemia work up No results for input(s): VITAMINB12, FOLATE, FERRITIN, TIBC, IRON, RETICCTPCT in the last 72 hours. Urinalysis    Component Value Date/Time   COLORURINE AMBER (A) 01/15/2020 1009   APPEARANCEUR HAZY (A) 01/15/2020 1009   LABSPEC 1.015 01/15/2020 1009   PHURINE 6.0 01/15/2020 1009   GLUCOSEU 50 (A) 01/15/2020 1009   HGBUR LARGE (A) 01/15/2020 1009    BILIRUBINUR NEGATIVE 01/15/2020 1009   KETONESUR 5 (A) 01/15/2020 1009   PROTEINUR 100 (A) 01/15/2020 1009   NITRITE NEGATIVE 01/15/2020 1009   LEUKOCYTESUR SMALL (A) 01/15/2020 1009   Sepsis Labs Invalid input(s): PROCALCITONIN,  WBC,  LACTICIDVEN Microbiology No results found for this or any previous visit (from the past 240 hour(s)).   Time coordinating discharge:  40 minutes  SIGNED:   [name], MD  [facility] 01/25/2020, 9:26 AM

## 2020-01-27 ENCOUNTER — Telehealth: Payer: Self-pay

## 2020-01-27 ENCOUNTER — Other Ambulatory Visit: Payer: Self-pay

## 2020-01-27 ENCOUNTER — Other Ambulatory Visit: Payer: Self-pay | Admitting: Internal Medicine

## 2020-01-27 DIAGNOSIS — L0291 Cutaneous abscess, unspecified: Secondary | ICD-10-CM

## 2020-01-27 MED ORDER — DOXYCYCLINE HYCLATE 100 MG PO TABS: 100.0000 mg | ORAL_TABLET | Freq: Two times a day (BID) | ORAL | 2 refills | Status: DC

## 2020-01-27 NOTE — Telephone Encounter (Signed)
Have sent in doxycycline as a replacement

## 2020-01-27 NOTE — Telephone Encounter (Signed)
Received call from patient's family stating that after taking his amoxicillin the patient reported severe itching. Did not experience any anaphylactic signs or symptoms but reports the itching was severe. Requesting a different medication.   Leeman Johnsey Loyola Mast, RN

## 2020-01-28 ENCOUNTER — Telehealth (HOSPITAL_COMMUNITY): Payer: Self-pay | Admitting: Pharmacist

## 2020-01-28 NOTE — Progress Notes (Signed)
Patient also called pharmacy with concerns for itching with amoxicillin. Noted that new prescription for doxycycline phoned in to [facility].

## 2020-02-05 ENCOUNTER — Other Ambulatory Visit (HOSPITAL_COMMUNITY): Payer: Self-pay | Admitting: Urology

## 2020-02-05 DIAGNOSIS — N360 Urethral fistula: Secondary | ICD-10-CM

## 2020-02-11 ENCOUNTER — Ambulatory Visit (HOSPITAL_COMMUNITY): Payer: BLUE CROSS/BLUE SHIELD

## 2020-02-14 ENCOUNTER — Other Ambulatory Visit: Payer: Self-pay

## 2020-02-14 ENCOUNTER — Ambulatory Visit (HOSPITAL_COMMUNITY)
Admission: RE | Admit: 2020-02-14 | Discharge: 2020-02-14 | Disposition: A | Discharge status: Home / Self Care | Payer: BLUE CROSS/BLUE SHIELD | Source: Ambulatory Visit | Attending: Urology | Admitting: Urology

## 2020-02-14 DIAGNOSIS — N360 Urethral fistula: Secondary | ICD-10-CM | POA: Insufficient documentation

## 2020-02-14 MED ORDER — IOHEXOL 300 MG/ML  SOLN
100.0000 mL | Freq: Once | INTRAMUSCULAR | Status: AC | PRN
  Administered 2020-02-14: 100 mL via INTRAVENOUS

## 2020-02-19 ENCOUNTER — Ambulatory Visit: Payer: BLUE CROSS/BLUE SHIELD | Admitting: Internal Medicine

## 2020-02-19 ENCOUNTER — Other Ambulatory Visit: Payer: Self-pay

## 2020-02-19 DIAGNOSIS — E1169 Type 2 diabetes mellitus with other specified complication: Secondary | ICD-10-CM

## 2020-02-19 DIAGNOSIS — Z5181 Encounter for therapeutic drug level monitoring: Secondary | ICD-10-CM

## 2020-02-19 DIAGNOSIS — A429 Actinomycosis, unspecified: Secondary | ICD-10-CM

## 2020-02-19 NOTE — Progress Notes (Signed)
[place] [phone]

## 2020-02-20 ENCOUNTER — Encounter: Payer: Self-pay | Admitting: Internal Medicine

## 2020-02-20 DIAGNOSIS — Z5181 Encounter for therapeutic drug level monitoring: Secondary | ICD-10-CM | POA: Insufficient documentation

## 2020-02-20 DIAGNOSIS — E119 Type 2 diabetes mellitus without complications: Secondary | ICD-10-CM | POA: Insufficient documentation

## 2020-02-20 NOTE — Assessment & Plan Note (Signed)
He will need continued efforts at improving his sugar control.

## 2020-02-20 NOTE — Assessment & Plan Note (Signed)
Will get a copy of his labs from his recent PCP visit

## 2020-02-20 NOTE — Assessment & Plan Note (Signed)
He is on appropriate therapy with doxycycline and will continue for 3 months.  He has refills and knows to continue it.  He otherwise will return as needed

## 2020-02-20 NOTE — Progress Notes (Signed)
   Subjective:    Patient ID: Glenn Murphy, male    DOB: 27-Sep-1976, 44 y.o.   MRN: [id number]  HPI Here for follow up of Actinomycosis.   He has a history of hidradinitis suppurativa and recently hospitalized for recent diabetic ketoacidosis and acute renal failure.  His course was complicated by urinary retention and with multiple attempts at instrumentation and traumatic catheterization and rectal injury and seen and managed by urology.    Blood culture was sent during his hospitalization and grew Actinomyces naeslundii.  He has been on long term doxycycline previously and was put on amoxicillin during his hospitalization and after.  He though developed significant itching and changed to doxycycline, which he is taking twice a day.  No current fever or chills.  He is asking about CT results.    Review of Systems  Constitutional: Negative for chills and fever.  Gastrointestinal: Negative for diarrhea and nausea.  Skin: Negative for rash.       Objective:   Physical Exam Constitutional:      Appearance: Normal appearance.  Eyes:     General: No scleral icterus. Cardiovascular:     Rate and Rhythm: Normal rate and regular rhythm.     Heart sounds: No murmur.  Pulmonary:     Effort: Pulmonary effort is normal.  Neurological:     General: No focal deficit present.     Mental Status: He is alert.  Psychiatric:        Mood and Affect: Mood normal.   SH: + tobacco        Assessment & Plan:

## 2020-02-21 ENCOUNTER — Other Ambulatory Visit (HOSPITAL_COMMUNITY): Payer: Self-pay | Admitting: Urology

## 2020-02-21 DIAGNOSIS — N360 Urethral fistula: Secondary | ICD-10-CM

## 2020-02-25 ENCOUNTER — Other Ambulatory Visit: Payer: Self-pay

## 2020-02-25 ENCOUNTER — Ambulatory Visit (HOSPITAL_COMMUNITY)
Admission: RE | Admit: 2020-02-25 | Discharge: 2020-02-25 | Disposition: A | Discharge status: Home / Self Care | Payer: BLUE CROSS/BLUE SHIELD | Source: Ambulatory Visit | Attending: Urology | Admitting: Urology

## 2020-02-25 DIAGNOSIS — N360 Urethral fistula: Secondary | ICD-10-CM | POA: Insufficient documentation

## 2020-02-25 DIAGNOSIS — Z466 Encounter for fitting and adjustment of urinary device: Secondary | ICD-10-CM | POA: Insufficient documentation

## 2020-02-25 HISTORY — PX: IR CATHETER TUBE CHANGE: IMG717

## 2020-02-25 MED ORDER — IOHEXOL 300 MG/ML  SOLN: 50.0000 mL | Freq: Once | INTRAMUSCULAR | Status: DC | PRN

## 2020-02-25 MED ORDER — LIDOCAINE HCL 1 % IJ SOLN
INTRAMUSCULAR | Status: AC
  Filled 2020-02-25: qty 20

## 2020-02-25 MED ORDER — LIDOCAINE VISCOUS HCL 2 % MT SOLN
OROMUCOSAL | Status: AC
  Filled 2020-02-25: qty 15

## 2020-02-25 MED ORDER — LIDOCAINE VISCOUS HCL 2 % MT SOLN
OROMUCOSAL | Status: DC | PRN
  Administered 2020-02-25: 10 mL via OROMUCOSAL

## 2020-02-25 MED ORDER — LIDOCAINE-EPINEPHRINE (PF) 2 %-1:200000 IJ SOLN
INTRAMUSCULAR | Status: DC | PRN
  Administered 2020-02-25: 10 mL

## 2020-02-25 MED ORDER — LIDOCAINE-EPINEPHRINE 1 %-1:100000 IJ SOLN
INTRAMUSCULAR | Status: AC
  Filled 2020-02-25: qty 1

## 2020-02-25 NOTE — Procedures (Signed)
Pre procedural Dx: Urethral injury Post procedural Dx: Same  Technically successful fluoro guided exchange and upsizing of suprapubic catheter.  EBL: None Complications: None immediate  Katherina Right, MD Pager #: [phone]

## 2020-03-10 ENCOUNTER — Ambulatory Visit: Payer: BLUE CROSS/BLUE SHIELD

## 2020-04-20 ENCOUNTER — Other Ambulatory Visit: Payer: Self-pay

## 2020-04-20 ENCOUNTER — Encounter (HOSPITAL_COMMUNITY): Payer: Self-pay

## 2020-04-20 ENCOUNTER — Emergency Department (HOSPITAL_COMMUNITY)
Admission: EM | Admit: 2020-04-20 | Discharge: 2020-04-20 | Disposition: A | Discharge status: Home / Self Care | Payer: BLUE CROSS/BLUE SHIELD | Attending: Emergency Medicine | Admitting: Emergency Medicine

## 2020-04-20 DIAGNOSIS — Z5321 Procedure and treatment not carried out due to patient leaving prior to being seen by health care provider: Secondary | ICD-10-CM | POA: Diagnosis not present

## 2020-04-20 DIAGNOSIS — R319 Hematuria, unspecified: Secondary | ICD-10-CM | POA: Insufficient documentation

## 2020-04-20 DIAGNOSIS — N5089 Other specified disorders of the male genital organs: Secondary | ICD-10-CM | POA: Diagnosis not present

## 2020-04-20 LAB — COMPREHENSIVE METABOLIC PANEL
ALT: 13 U/L (ref 0–44)
AST: 25 U/L (ref 15–41)
Albumin: 3.4 g/dL — ABNORMAL LOW (ref 3.5–5.0)
Alkaline Phosphatase: 143 U/L — ABNORMAL HIGH (ref 38–126)
Anion gap: 10 (ref 5–15)
BUN: 5 mg/dL — ABNORMAL LOW (ref 6–20)
CO2: 20 mmol/L — ABNORMAL LOW (ref 22–32)
Calcium: 8.8 mg/dL — ABNORMAL LOW (ref 8.9–10.3)
Chloride: 105 mmol/L (ref 98–111)
Creatinine, Ser: 0.58 mg/dL — ABNORMAL LOW (ref 0.61–1.24)
GFR calc Af Amer: >60 mL/min (ref >60)
GFR calc non Af Amer: >60 mL/min (ref >60)
Glucose, Bld: 100 mg/dL — ABNORMAL HIGH (ref 70–99)
Potassium: 3.8 mmol/L (ref 3.5–5.1)
Sodium: 135 mmol/L (ref 135–145)
Total Bilirubin: 0.8 mg/dL (ref 0.3–1.2)
Total Protein: 7.2 g/dL (ref 6.5–8.1)

## 2020-04-20 LAB — CBC WITH DIFFERENTIAL/PLATELET
Abs Immature Granulocytes: 0.01 10*3/uL (ref 0.00–0.07)
Basophils Absolute: 0 10*3/uL (ref 0.0–0.1)
Basophils Relative: 1 %
Eosinophils Absolute: 0.1 10*3/uL (ref 0.0–0.5)
Eosinophils Relative: 2 %
HCT: 40.5 % (ref 39.0–52.0)
Hemoglobin: 13.2 g/dL (ref 13.0–17.0)
Immature Granulocytes: 0 %
Lymphocytes Relative: 43 %
Lymphs Abs: 3.2 10*3/uL (ref 0.7–4.0)
MCH: 31.7 pg (ref 26.0–34.0)
MCHC: 32.6 g/dL (ref 30.0–36.0)
MCV: 97.1 fL (ref 80.0–100.0)
Monocytes Absolute: 0.5 10*3/uL (ref 0.1–1.0)
Monocytes Relative: 7 %
Neutro Abs: 3.5 10*3/uL (ref 1.7–7.7)
Neutrophils Relative %: 47 %
Platelets: 215 10*3/uL (ref 150–400)
RBC: 4.17 MIL/uL — ABNORMAL LOW (ref 4.22–5.81)
RDW: 13.3 % (ref 11.5–15.5)
WBC: 7.3 10*3/uL (ref 4.0–10.5)
nRBC: 0 % (ref 0.0–0.2)

## 2020-04-20 NOTE — ED Triage Notes (Signed)
Pt reports he has a suprapubic catheter, starting having swelling to his Right testicle and temp 100.6, blood tint to her urine bag x12 hours that has now subsided

## 2020-04-20 NOTE — ED Notes (Signed)
Pt stated that he will come back tomorrow.

## 2020-04-21 ENCOUNTER — Other Ambulatory Visit: Payer: Self-pay

## 2020-04-21 ENCOUNTER — Emergency Department (HOSPITAL_COMMUNITY)
Admission: EM | Admit: 2020-04-21 | Discharge: 2020-04-21 | Disposition: A | Discharge status: Home / Self Care | Payer: BLUE CROSS/BLUE SHIELD | Attending: Emergency Medicine | Admitting: Emergency Medicine

## 2020-04-21 ENCOUNTER — Encounter (HOSPITAL_COMMUNITY): Payer: Self-pay

## 2020-04-21 DIAGNOSIS — R509 Fever, unspecified: Secondary | ICD-10-CM | POA: Insufficient documentation

## 2020-04-21 DIAGNOSIS — N5089 Other specified disorders of the male genital organs: Secondary | ICD-10-CM | POA: Diagnosis present

## 2020-04-21 DIAGNOSIS — Z5321 Procedure and treatment not carried out due to patient leaving prior to being seen by health care provider: Secondary | ICD-10-CM | POA: Diagnosis not present

## 2020-04-21 LAB — URINALYSIS, ROUTINE W REFLEX MICROSCOPIC
Bilirubin Urine: NEGATIVE
Glucose, UA: NEGATIVE mg/dL
Ketones, ur: NEGATIVE mg/dL
Nitrite: POSITIVE — AB
Protein, ur: NEGATIVE mg/dL
Specific Gravity, Urine: 1.01 (ref 1.005–1.030)
pH: 6.5 (ref 5.0–8.0)

## 2020-04-21 LAB — URINALYSIS, MICROSCOPIC (REFLEX): RBC / HPF: NONE SEEN RBC/hpf (ref 0–5)

## 2020-04-21 NOTE — ED Notes (Signed)
lwbs

## 2020-04-21 NOTE — ED Triage Notes (Signed)
Pt reports right testicle swelling, fever of 100.6 and some blood in his suprapubic catheter that started on Thursday. LWBS yesterday, labs done.

## 2020-04-23 ENCOUNTER — Other Ambulatory Visit: Payer: Self-pay

## 2020-04-23 ENCOUNTER — Ambulatory Visit (HOSPITAL_COMMUNITY)
Admission: EM | Admit: 2020-04-23 | Discharge: 2020-04-23 | Disposition: A | Discharge status: Home / Self Care | Payer: BLUE CROSS/BLUE SHIELD | Attending: Internal Medicine | Admitting: Internal Medicine

## 2020-04-23 ENCOUNTER — Encounter (HOSPITAL_COMMUNITY): Payer: Self-pay

## 2020-04-23 ENCOUNTER — Ambulatory Visit (HOSPITAL_COMMUNITY)
Admit: 2020-04-23 | Discharge: 2020-04-23 | Disposition: A | Discharge status: Home / Self Care | Payer: BLUE CROSS/BLUE SHIELD | Attending: Internal Medicine | Admitting: Internal Medicine

## 2020-04-23 DIAGNOSIS — N451 Epididymitis: Secondary | ICD-10-CM

## 2020-04-23 MED ORDER — LEVOFLOXACIN 500 MG PO TABS
500.0000 mg | ORAL_TABLET | Freq: Every day | ORAL | 0 refills | Status: AC
Start: 2020-04-23 — End: 2020-05-03

## 2020-04-23 NOTE — ED Triage Notes (Signed)
Pt presents with R testicle swelling and pain that started approx one week ago and has been worsening.  States it is uncomfortable to sit or stand.  No change in coloration.     Pt has suprapubic catheter since April d/t fistula.  No change in urine.  Urine in bag appears light yellow and without cloudiness.

## 2020-04-23 NOTE — ED Provider Notes (Signed)
[facility]    CSN: [id number] Arrival date & time: 04/23/20  1250      History   Chief Complaint Chief Complaint  Patient presents with  . Testicle Pain    right    HPI Glenn Murphy is a 44 y.o. male comes to the urgent care with painful right testicular swelling which started 1 week ago.  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  He denies any dysuria urgency or frequency.  Patient has a suprapubic catheter in the urine bag is draining clear urine.  He was seen in the emergency department and UA revealed leukocyte esterase as well as nitrite.  Patient left the ED before he was seen.  No nausea, vomiting or flank pain.  Patient has noticed that his right testicle is getting more painful over the past week.  He has noticed worsening swelling in the right testicle over the past few days.   HPI  Past Medical History:  Diagnosis Date  . Abscess    recurrent infections  . Arthritis   . Diabetes mellitus without complication ([place])   . Hypertension   . Obesity     Patient Active Problem List   Diagnosis Date Noted  . Medication monitoring encounter 02/20/2020  . Diabetes ([place]) 02/20/2020  . 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 Acute urinary retention 01/24/2020  . Rectal injury, initial encounter 01/24/2020  . DKA (diabetic ketoacidoses) ([place]) 01/15/2020  . Abscess 05/26/2014    Past Surgical History:  Procedure Laterality Date  . CYST EXCISION N/A 01/28/2016   Procedure: EXCISION SEBACEOUS CYST FOREHEAD X 2;  Surgeon: Donnie Mesa, MD;  Location: [place];  Service: General;  Laterality: N/A;  . CYST REMOVAL TRUNK Left 01/28/2016   Procedure: EXCISION SEBACEOUS CYST LEFT SHOULDER AND MID-BACK;  Surgeon: Donnie Mesa, MD;  Location: [place];  Service: General;  Laterality: Left;  . HYDRADENITIS EXCISION Right 01/28/2016   Procedure: EXCISION HIDRADENITIS RIGHT AXILLA ;  Surgeon: Donnie Mesa, MD;  Location: [place];   Service: General;  Laterality: Right;  . HYDRADENITIS EXCISION N/A 01/28/2016   Procedure: EXCISION HIDRADENITIS POSTERIOR NECK ;  Surgeon: Donnie Mesa, MD;  Location: [place];  Service: General;  Laterality: N/A;  . IR CATHETER TUBE CHANGE  02/25/2020       Home Medications    Prior to Admission medications   Medication Sig Start Date End Date Taking? Authorizing Provider  allopurinol (ZYLOPRIM) 300 MG tablet Take 300 mg by mouth daily. 11/16/15  Yes [provider]  blood glucose meter kit and supplies KIT Dispense based on patient and insurance preference. Use up to four times daily as directed. (FOR ICD-9 250.00, 250.01). 01/20/20  Yes Kc, Ramesh, MD  busPIRone (BUSPAR) 10 MG tablet Take 10 mg by mouth 3 (three) times daily. 09/10/19  Yes [provider]  clindamycin (CLINDAGEL) 1 % gel Apply 1 application topically See admin instructions. Apply to bumps on face and body 1-2 times daily as needed. 12/06/19  Yes [provider]  doxycycline (VIBRA-TABS) 100 MG tablet Take 1 tablet (100 mg total) by mouth 2 (two) times daily. 01/27/20  Yes Comer, Okey Regal, MD  ferrous sulfate 325 (65 FE) MG tablet Take 325 mg by mouth daily.   Yes [provider]  furosemide (LASIX) 20 MG tablet Take 20 mg by mouth daily. 02/11/20  Yes [provider]  gabapentin (NEURONTIN) 600 MG tablet Take 600 mg by mouth 3 (three)  times daily. 12/31/19  Yes [provider]  ibuprofen (ADVIL) 800 MG tablet Take 800 mg by mouth every 8 (eight) hours as needed.   Yes [provider]  insulin aspart (NOVOLOG FLEXPEN) 100 UNIT/ML FlexPen Inject 4 Units into the skin 3 (three) times daily with meals. 01/24/20  Yes [name], MD  Insulin Pen Needle (PEN NEEDLES 3/16") 31G X 5 MM MISC USE WITH INSULIN PEN 01/20/20  Yes Kc, Maren [place], MD  Magnesium 200 MG TABS Take 200 mg by mouth daily.   Yes [provider]  metoprolol succinate (TOPROL-XL) 25 MG 24 hr tablet Take  25 mg by mouth daily. 12/19/15  Yes [provider]  Milk Thistle Extract 175 MG TABS Take 175 mg by mouth daily.   Yes [provider]  Omega-3 1000 MG CAPS Take 1 capsule by mouth daily.   Yes [provider]  oxyCODONE (OXY IR/ROXICODONE) 5 MG immediate release tablet Take 5 mg by mouth every 4 (four) hours as needed. 02/06/20  Yes [provider]  traMADol (ULTRAM) 50 MG tablet Take by mouth every 6 (six) hours as needed.   Yes [provider]  triamcinolone cream (KENALOG) 0.1 % Apply 1 application topically 2 (two) times daily as needed.  12/19/15  Yes [provider]  Vitamin D, Ergocalciferol, (DRISDOL) 1.25 MG (50000 UNIT) CAPS capsule Take 50,000 Units by mouth once a week. 12/31/19  Yes [provider]  levofloxacin (LEVAQUIN) 500 MG tablet Take 1 tablet (500 mg total) by mouth daily for 10 days. 04/23/20 05/03/20  Chase Picket, MD  insulin glargine (LANTUS SOLOSTAR) 100 UNIT/ML Solostar Pen Inject 10 Units into the skin 2 (two) times daily. 01/24/20 04/23/20  [name], MD    Family History Family History  Problem Relation Age of Onset  . Cancer Father     Social History Social History   Tobacco Use  . Smoking status: Current Every Day Smoker    Packs/day: 0.50    Types: Cigarettes  . Smokeless tobacco: Never Used  Vaping Use  . Vaping Use: Never used  Substance Use Topics  . Alcohol use: Yes    Comment: 16oz beer a day  fifth of liquor over  q 3-4 days  . Drug use: Yes    Frequency: 2.0 times per week    Types: Marijuana    Comment: 01/26/16     Allergies   Amoxicillin and Other   Review of Systems Review of Systems  Constitutional: Negative.   HENT: Negative.   Respiratory: Negative.   Genitourinary: Positive for scrotal swelling and testicular pain. Negative for discharge, flank pain, penile pain and penile swelling.  Musculoskeletal: Negative.   Neurological: Negative for dizziness,  light-headedness, numbness and headaches.     Physical Exam Triage Vital Signs ED Triage Vitals  Enc Vitals Group     BP 04/23/20 1431 (S) (!) 172/94     Pulse Rate 04/23/20 1431 88     Resp 04/23/20 1431 20     Temp 04/23/20 1431 98.1 F (36.7 C)     Temp Source 04/23/20 1431 Oral     SpO2 04/23/20 1431 97 %     Weight --      Height --      Head Circumference --      Peak Flow --      Pain Score 04/23/20 1424 7     Pain Loc --      Pain Edu? --  Excl. in GC? --    No data found.  Updated Vital Signs BP (S) (!) 172/94 (BP Location: Right Arm) Comment: pt states this is bc he is in pain.  normally 140s.  Pulse 88   Temp 98.1 F (36.7 C) (Oral)   Resp 20   SpO2 97%   Visual Acuity Right Eye Distance:   Left Eye Distance:   Bilateral Distance:    Right Eye Near:   Left Eye Near:    Bilateral Near:     Physical Exam Vitals and nursing note reviewed.  Constitutional:      General: He is in acute distress.     Appearance: He is not ill-appearing.  Cardiovascular:     Pulses: Normal pulses.     Heart sounds: Normal heart sounds.  Pulmonary:     Effort: Pulmonary effort is normal.     Breath sounds: Normal breath sounds.  Abdominal:     General: Abdomen is flat. There is no distension.     Tenderness: There is no abdominal tenderness. There is no rebound.  Genitourinary:    Penis: Normal.      Comments: Right scrotal and testicular swelling.  Exquisite tenderness over the posterior aspect of the testicle.  Spermatic cord on the right side is tender to palpation. Musculoskeletal:        General: Normal range of motion.  Skin:    Capillary Refill: Capillary refill takes less than 2 seconds.  Neurological:     General: No focal deficit present.     Mental Status: He is alert.      UC Treatments / Results  Labs (all labs ordered are listed, but only abnormal results are displayed) Labs Reviewed  URINE CULTURE    EKG   Radiology No results  found.  Procedures Procedures (including critical care time)  Medications Ordered in UC Medications - No data to display  Initial Impression / Assessment and Plan / UC Course  I have reviewed the triage vital signs and the nursing notes.  Pertinent labs & imaging results that were available during my care of the patient were reviewed by me and considered in my medical decision making (see chart for details).     1.  Acute epididymitis: Scrotal ultrasound to evaluate for torsion. Levaquin 500 mg daily for 10 days Urine cultures Over-the-counter ibuprofen/Tylenol for pain and/or fever If patient symptoms worsen he is advised to go to the urologist to be reevaluated. Final Clinical Impressions(s) / UC Diagnoses   Final diagnoses:  Acute epididymitis   Discharge Instructions   None    ED Prescriptions    Medication Sig Dispense Auth. Provider   levofloxacin (LEVAQUIN) 500 MG tablet Take 1 tablet (500 mg total) by mouth daily for 10 days. 10 tablet Cali Hope, Myrene Galas, MD     PDMP not reviewed this encounter.   Chase Picket, MD 04/23/20 [phone]

## 2020-04-26 LAB — URINE CULTURE: Culture: >=100000 — AB

## 2020-05-29 ENCOUNTER — Other Ambulatory Visit (HOSPITAL_COMMUNITY): Payer: Self-pay | Admitting: Urology

## 2020-05-29 DIAGNOSIS — N360 Urethral fistula: Secondary | ICD-10-CM

## 2020-06-02 ENCOUNTER — Other Ambulatory Visit: Payer: Self-pay

## 2020-06-02 ENCOUNTER — Ambulatory Visit (HOSPITAL_COMMUNITY)
Admission: RE | Admit: 2020-06-02 | Discharge: 2020-06-02 | Disposition: A | Discharge status: Home / Self Care | Payer: BLUE CROSS/BLUE SHIELD | Source: Ambulatory Visit | Attending: Urology | Admitting: Urology

## 2020-06-02 DIAGNOSIS — N360 Urethral fistula: Secondary | ICD-10-CM

## 2020-06-02 DIAGNOSIS — Z435 Encounter for attention to cystostomy: Secondary | ICD-10-CM | POA: Insufficient documentation

## 2020-06-02 HISTORY — PX: IR CATHETER TUBE CHANGE: IMG717

## 2020-06-02 MED ORDER — IOHEXOL 300 MG/ML  SOLN
50.0000 mL | Freq: Once | INTRAMUSCULAR | Status: AC | PRN
  Administered 2020-06-02: 10 mL

## 2020-06-02 MED ORDER — LIDOCAINE VISCOUS HCL 2 % MT SOLN
OROMUCOSAL | Status: AC
  Filled 2020-06-02: qty 15

## 2020-06-02 MED ORDER — LIDOCAINE-EPINEPHRINE (PF) 2 %-1:200000 IJ SOLN
INTRAMUSCULAR | Status: AC
  Administered 2020-06-02: 10 mL
  Filled 2020-06-02: qty 20

## 2020-06-03 ENCOUNTER — Other Ambulatory Visit (HOSPITAL_COMMUNITY): Payer: Self-pay | Admitting: Interventional Radiology

## 2020-06-03 DIAGNOSIS — N360 Urethral fistula: Secondary | ICD-10-CM

## 2020-07-01 ENCOUNTER — Other Ambulatory Visit: Payer: Self-pay

## 2020-07-01 ENCOUNTER — Encounter (HOSPITAL_COMMUNITY): Payer: Self-pay

## 2020-07-01 ENCOUNTER — Ambulatory Visit (HOSPITAL_COMMUNITY)
Admission: RE | Admit: 2020-07-01 | Discharge: 2020-07-01 | Disposition: A | Discharge status: Home / Self Care | Payer: BLUE CROSS/BLUE SHIELD | Source: Ambulatory Visit | Attending: Family Medicine | Admitting: Family Medicine

## 2020-07-01 VITALS — BP 162/83 | HR 105 | Temp 98.1°F | Resp 18

## 2020-07-01 DIAGNOSIS — N451 Epididymitis: Secondary | ICD-10-CM | POA: Insufficient documentation

## 2020-07-01 DIAGNOSIS — N5089 Other specified disorders of the male genital organs: Secondary | ICD-10-CM | POA: Insufficient documentation

## 2020-07-01 LAB — POCT URINALYSIS DIPSTICK, ED / UC
Glucose, UA: NEGATIVE mg/dL
Nitrite: POSITIVE — AB
Protein, ur: 100 mg/dL — AB
Specific Gravity, Urine: >=1.03 (ref 1.005–1.030)
Urobilinogen, UA: 1 mg/dL (ref 0.0–1.0)
pH: 6 (ref 5.0–8.0)

## 2020-07-01 MED ORDER — LEVOFLOXACIN 500 MG PO TABS
500.0000 mg | ORAL_TABLET | Freq: Every day | ORAL | 0 refills | Status: DC
Start: 2020-07-01 — End: 2024-08-29

## 2020-07-01 NOTE — ED Provider Notes (Signed)
[facility]   [id number] 07/01/20 Arrival Time: 1255  ASSESSMENT & PLAN:  1. Scrotal swelling   2. Epididymitis without abscess     No s/s of testicular torsion. Urine culture sent.  Begin: Meds ordered this encounter  Medications   levofloxacin (LEVAQUIN) 500 MG tablet    Sig: Take 1 tablet (500 mg total) by mouth daily.    Dispense:  10 tablet    Refill:  0      Follow-up Information    Laruth Bouchard, MD.   Specialty: Family Medicine Why: As needed. Contact information: [address] [place] [address] [phone]               Reviewed expectations re: course of current medical issues. Questions answered. Outlined signs and symptoms indicating need for more acute intervention. Understanding verbalized. After Visit Summary given.   SUBJECTIVE: History from: patient. Glenn Murphy is a 44 y.o. male who reports bilateral scrotal swelling without significant pain; mild discomfort; h/o similar and dx with epididymitis; improved on Levaquin. No specific urine problems; has suprapubic catheter in place. Afebrile. No abd pain.   OBJECTIVE:  Vitals:   07/01/20 1321  BP: (!) 162/83  Pulse: (!) 105  Resp: 18  Temp: 98.1 F (36.7 C)  TempSrc: Oral  SpO2: 100%    General appearance: alert; no distress Eyes: PERRLA; EOMI; conjunctiva normal HENT: [place]; AT Neck: supple  Lungs: speaks full sentences without difficulty; unlabored Abd: obese GU: bilateral scrotal swelling; with some TTP over right epididymis Extremities: no edema Skin: warm and dry Neurologic: normal gait Psychological: alert and cooperative; normal mood and affect  Labs: Results for orders placed or performed during the hospital encounter of 07/01/20  POCT Urinalysis Dipstick (ED/UC)  Result Value Ref Range   Glucose, UA NEGATIVE NEGATIVE mg/dL   Bilirubin Urine SMALL (A) NEGATIVE   Ketones, ur TRACE (A) NEGATIVE mg/dL   Specific Gravity, Urine >=1.030 1.005 -  1.030   Hgb urine dipstick MODERATE (A) NEGATIVE   pH 6.0 5.0 - 8.0   Protein, ur 100 (A) NEGATIVE mg/dL   Urobilinogen, UA 1.0 0.0 - 1.0 mg/dL   Nitrite POSITIVE (A) NEGATIVE   Leukocytes,Ua SMALL (A) NEGATIVE   Labs Reviewed  POCT URINALYSIS DIPSTICK, ED / UC - Abnormal; Notable for the following components:      Result Value   Bilirubin Urine SMALL (*)    Ketones, ur TRACE (*)    Hgb urine dipstick MODERATE (*)    Protein, ur 100 (*)    Nitrite POSITIVE (*)    Leukocytes,Ua SMALL (*)    All other components within normal limits  URINE CULTURE      Allergies  Allergen Reactions   Amoxicillin Nausea Only    Unknown reaction   Other Other (See Comments)    Itchy throat-strawberries,watermelon    Past Medical History:  Diagnosis Date   Abscess    recurrent infections   Arthritis    Diabetes mellitus without complication (HCC)    Hypertension    Obesity    Social History   Socioeconomic History   Marital status: Single    Spouse name: Not on file   Number of children: Not on file   Years of education: Not on file   Highest education level: Not on file  Occupational History   Not on file  Tobacco Use   Smoking status: Current Every Day Smoker    Packs/day: 0.50    Types: Cigarettes  Smokeless tobacco: Never Used  Vaping Use   Vaping Use: Never used  Substance and Sexual Activity   Alcohol use: Yes    Comment: 16oz beer a day  fifth of liquor over  q 3-4 days   Drug use: Yes    Frequency: 2.0 times per week    Types: Marijuana    Comment: 01/26/16   Sexual activity: Not on file  Other Topics Concern   Not on file  Social History Narrative   Not on file   Social Determinants of Health   Financial Resource Strain:    Difficulty of Paying Living Expenses: Not on file  Food Insecurity:    Worried About Running Out of Food in the Last Year: Not on file   [employer] of Food in the Last Year: Not on file  Transportation Needs:     Lack of Transportation (Medical): Not on file   Lack of Transportation (Non-Medical): Not on file  Physical Activity:    Days of Exercise per Week: Not on file   Minutes of Exercise per Session: Not on file  Stress:    Feeling of Stress : Not on file  Social Connections:    Frequency of Communication with Friends and Family: Not on file   Frequency of Social Gatherings with Friends and Family: Not on file   Attends Religious Services: Not on file   Active Member of Clubs or Organizations: Not on file   Attends Banker Meetings: Not on file   Marital Status: Not on file  Intimate Partner Violence:    Fear of Current or Ex-Partner: Not on file   Emotionally Abused: Not on file   Physically Abused: Not on file   Sexually Abused: Not on file   Family History  Problem Relation Age of Onset   Cancer Father    Past Surgical History:  Procedure Laterality Date   CYST EXCISION N/A 01/28/2016   Procedure: EXCISION SEBACEOUS CYST FOREHEAD X 2;  Surgeon: [name], MD;  Location: [facility];  Service: General;  Laterality: N/A;   CYST REMOVAL TRUNK Left 01/28/2016   Procedure: EXCISION SEBACEOUS CYST LEFT SHOULDER AND MID-BACK;  Surgeon: [name], MD;  Location: [facility];  Service: General;  Laterality: Left;   HYDRADENITIS EXCISION Right 01/28/2016   Procedure: EXCISION HIDRADENITIS RIGHT AXILLA ;  Surgeon: [name], MD;  Location[facility];  Service: General;  Laterality: Right;   HYDRADENITIS EXCISION N/A 01/28/2016   Procedure: EXCISION HIDRADENITIS POSTERIOR NECK ;  Surgeon: [name], MD;  Location: [facility] OR;  Service: General;  Laterality: N/A;   IR CATHETER TUBE CHANGE  02/25/2020   IR CATHETER TUBE CHANGE  06/02/2020     Mardella Layman, MD 07/01/20 1452

## 2020-07-01 NOTE — ED Triage Notes (Signed)
Pt c/o bilateral testicular swelling, lower abdominal pain for approx 4 days. States he had similar complaint approx 1 month ago. Pt has suprapubic catheter in place.  Pt states he had blood in his urine 4 days ago, now resolved.  Denies any other change to urine, fever, chills, n/v/d, flank/back pain, penile discharge or rash.

## 2020-07-04 LAB — URINE CULTURE: Culture: >=100000 — AB

## 2020-07-13 ENCOUNTER — Ambulatory Visit (HOSPITAL_COMMUNITY)
Admission: EM | Admit: 2020-07-13 | Discharge: 2020-07-13 | Disposition: A | Discharge status: Home / Self Care | Payer: BLUE CROSS/BLUE SHIELD | Attending: Family Medicine | Admitting: Family Medicine

## 2020-07-13 ENCOUNTER — Other Ambulatory Visit: Payer: Self-pay

## 2020-07-13 ENCOUNTER — Ambulatory Visit (HOSPITAL_COMMUNITY)
Admission: RE | Admit: 2020-07-13 | Discharge: 2020-07-13 | Discharge status: Absent without leave | Payer: Self-pay | Source: Ambulatory Visit | Attending: Family Medicine | Admitting: Family Medicine

## 2020-07-13 ENCOUNTER — Encounter (HOSPITAL_COMMUNITY): Payer: Self-pay

## 2020-07-13 DIAGNOSIS — L0291 Cutaneous abscess, unspecified: Secondary | ICD-10-CM

## 2020-07-13 NOTE — Discharge Instructions (Addendum)
Keep area clean and dry Expect drainage for couple of days Follow-up as needed Continue taking the doxycycline twice a day.  This will help reduce bacteria

## 2020-07-13 NOTE — ED Provider Notes (Signed)
[facility]    CSN: [id number] Arrival date & time: 07/13/20  1900      History   Chief Complaint Chief Complaint  Patient presents with  . Appointment    1900  . Abscess    HPI Glenn Murphy is a 44 y.o. male.   HPI  Patient has recurring abscesses.  He has had them over and over again over his lifetime.  He also has diabetes and hypertension, obesity.  He states these are all well controlled.  He is here today because he has 2 abscesses that need to be lanced.  He has one on either cheekbone.  They are both swollen and painful.  They are both fluctuant.  He is on doxycycline twice a day to prevent skin infections.  This is from [name] in infectious disease.  He also has clindamycin gel to use on his skin.  Past Medical History:  Diagnosis Date  . Abscess    recurrent infections  . Arthritis   . Diabetes mellitus without complication ([name])   . Hypertension   . Obesity     Patient Active Problem List   Diagnosis Date Noted  . Medication monitoring encounter 02/20/2020  . Diabetes ([place]) 02/20/2020  . 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 Acute urinary retention 01/24/2020  . Rectal injury, initial encounter 01/24/2020  . DKA (diabetic ketoacidoses) 01/15/2020  . Abscess 05/26/2014    Past Surgical History:  Procedure Laterality Date  . CYST EXCISION N/A 01/28/2016   Procedure: EXCISION SEBACEOUS CYST FOREHEAD X 2;  Surgeon: Donnie Mesa, MD;  Location: [facility];  Service: General;  Laterality: N/A;  . CYST REMOVAL TRUNK Left 01/28/2016   Procedure: EXCISION SEBACEOUS CYST LEFT SHOULDER AND MID-BACK;  Surgeon: Donnie Mesa, MD;  Location: [facility];  Service: General;  Laterality: Left;  . HYDRADENITIS EXCISION Right 01/28/2016   Procedure: EXCISION HIDRADENITIS RIGHT AXILLA ;  Surgeon: Donnie Mesa, MD;  Location: [facility];  Service: General;  Laterality: Right;  . HYDRADENITIS EXCISION N/A 01/28/2016   Procedure: EXCISION HIDRADENITIS  POSTERIOR NECK ;  Surgeon: Donnie Mesa, MD;  Location: [facility];  Service: General;  Laterality: N/A;  . IR CATHETER TUBE CHANGE  02/25/2020  . IR CATHETER TUBE CHANGE  06/02/2020       Home Medications    Prior to Admission medications   Medication Sig Start Date End Date Taking? Authorizing Provider  allopurinol (ZYLOPRIM) 300 MG tablet Take 300 mg by mouth daily. 11/16/15   [provider]  blood glucose meter kit and supplies KIT Dispense based on patient and insurance preference. Use up to four times daily as directed. (FOR ICD-9 250.00, 250.01). 01/20/20   Antonieta Pert, MD  busPIRone (BUSPAR) 10 MG tablet Take 10 mg by mouth 3 (three) times daily. 09/10/19   [provider]  clindamycin (CLINDAGEL) 1 % gel Apply 1 application topically See admin instructions. Apply to bumps on face and body 1-2 times daily as needed. 12/06/19   [provider]  doxycycline (VIBRA-TABS) 100 MG tablet Take 1 tablet (100 mg total) by mouth 2 (two) times daily. 01/27/20   [name], MD  ferrous sulfate 325 (65 FE) MG tablet Take 325 mg by mouth daily.    [provider]  furosemide (LASIX) 20 MG tablet Take 20 mg by mouth daily. 02/11/20   [provider]  gabapentin (NEURONTIN) 600 MG tablet Take 600 mg by mouth 3 (three) times daily. 12/31/19  [provider]  ibuprofen (ADVIL) 800 MG tablet Take 800 mg by mouth every 8 (eight) hours as needed.    [provider]  Insulin Pen Needle (PEN NEEDLES 3/16") 31G X 5 MM MISC USE WITH INSULIN PEN 01/20/20   Antonieta Pert, MD  levofloxacin (LEVAQUIN) 500 MG tablet Take 1 tablet (500 mg total) by mouth daily. 07/01/20   [name], MD  Magnesium 200 MG TABS Take 200 mg by mouth daily.    [provider]  metoprolol succinate (TOPROL-XL) 25 MG 24 hr tablet Take 25 mg by mouth daily. 12/19/15   [provider]  Milk Thistle Extract 175 MG TABS Take 175 mg by mouth daily.    [provider]  Omega-3 1000 MG CAPS Take 1 capsule by mouth daily.    [provider]  sildenafil (REVATIO) 20 MG tablet SMARTSIG:1-5 Tablet(s) By Mouth Daily PRN 05/18/20   [provider]  triamcinolone cream (KENALOG) 0.1 % Apply 1 application topically 2 (two) times daily as needed.  12/19/15   [provider]  Vitamin D, Ergocalciferol, (DRISDOL) 1.25 MG (50000 UNIT) CAPS capsule Take 50,000 Units by mouth once a week. 12/31/19   [provider]  insulin aspart (NOVOLOG FLEXPEN) 100 UNIT/ML FlexPen Inject 4 Units into the skin 3 (three) times daily with meals. 01/24/20 07/01/20  [name], MD  insulin glargine (LANTUS SOLOSTAR) 100 UNIT/ML Solostar Pen Inject 10 Units into the skin 2 (two) times daily. 01/24/20 04/23/20  [name], MD    Family History Family History  Problem Relation Age of Onset  . Cancer Father     Social History Social History   Tobacco Use  . Smoking status: Current Every Day Smoker    Packs/day: 0.50    Types: Cigarettes  . Smokeless tobacco: Never Used  Vaping Use  . Vaping Use: Never used  Substance Use Topics  . Alcohol use: Yes    Comment: 16oz beer a day  fifth of liquor over  q 3-4 days  . Drug use: Yes    Frequency: 2.0 times per week    Types: Marijuana    Comment: 01/26/16     Allergies   Amoxicillin and Other   Review of Systems Review of Systems See HPI  Physical Exam Triage Vital Signs ED Triage Vitals  Enc Vitals Group     BP 07/13/20 2011 (!) 160/85     Pulse Rate 07/13/20 2011 99     Resp 07/13/20 2011 (!) 21     Temp 07/13/20 2011 99.9 F (37.7 C)     Temp Source 07/13/20 2011 Oral     SpO2 07/13/20 2011 97 %     Weight --      Height --      Head Circumference --      Peak Flow --      Pain Score 07/13/20 2010 9     Pain Loc --      Pain Edu? --      Excl. in [place]? --    No data found.  Updated Vital Signs BP (!) 160/85 (BP Location: Left Arm)   Pulse 99   Temp 99.9 F (37.7 C)  (Oral)   Resp (!) 21   SpO2 97%       Physical Exam Constitutional:      General: He is not in acute distress.    Appearance: He is well-developed.  HENT:     Head: Normocephalic and  atraumatic.   Eyes:     Conjunctiva/sclera: Conjunctivae normal.     Pupils: Pupils are equal, round, and reactive to light.  Cardiovascular:     Rate and Rhythm: Normal rate.  Pulmonary:     Effort: Pulmonary effort is normal. No respiratory distress.  Musculoskeletal:        General: Normal range of motion.     Cervical back: Normal range of motion.  Skin:    General: Skin is warm and dry.  Neurological:     Mental Status: He is alert.  Psychiatric:        Behavior: Behavior normal.    Each area abscesses cleansed with alcohol.  Subsides with 1 cc of 1% lidocaine.  Stab with an 11 blade.  Purulence expressed.  Band-Aid placed  UC Treatments / Results  Labs (all labs ordered are listed, but only abnormal results are displayed) Labs Reviewed - No data to display  EKG   Radiology No results found.  Procedures Procedures (including critical care time)  Medications Ordered in UC Medications - No data to display  Initial Impression / Assessment and Plan / UC Course  I have reviewed the triage vital signs and the nursing notes.  Pertinent labs & imaging results that were available during my care of the patient were reviewed by me and considered in my medical decision making (see chart for details).     Wound care discussed.  Follow-up with PCP Final Clinical Impressions(s) / UC Diagnoses   Final diagnoses:  Abscess     Discharge Instructions     Keep area clean and dry Expect drainage for couple of days Follow-up as needed Continue taking the doxycycline twice a day.  This will help reduce bacteria   ED Prescriptions    None     PDMP not reviewed this encounter.   Raylene Everts, MD 07/13/20 2146

## 2020-07-13 NOTE — ED Triage Notes (Signed)
Pt presents with 2 abscess in the faces for the past 4 days. States he has sensitive skin and the friction with the facemask cause this abscesses.

## 2020-07-14 IMAGING — CT CT PELVIS W/O CM
2 of 3 series · 15 of 46 positions shown, 17 images · non-contrast
Comparison: 01/22/2020

CLINICAL DATA: Pelvic trauma bloody stools with known urethral
injury

EXAM:
CT PELVIS WITHOUT CONTRAST
TECHNIQUE: Multidetector CT imaging of the pelvis was performed following the
standard protocol without intravenous contrast. Water-soluble
contrast was infused into the existing Foley catheter.

[Series 3: pelvis 5.0 i30f 2 · axial · 0.95mm/px · z∈[+714,+988]mm · 12 of 65 slices shown, 14 images]
[im 5/65  soft-tissue]
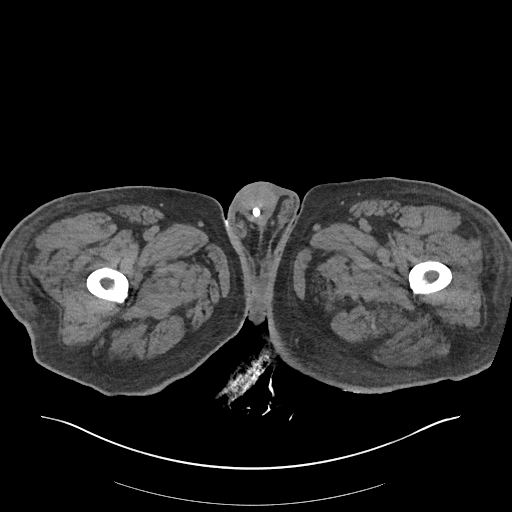
[im 5/65  bone]
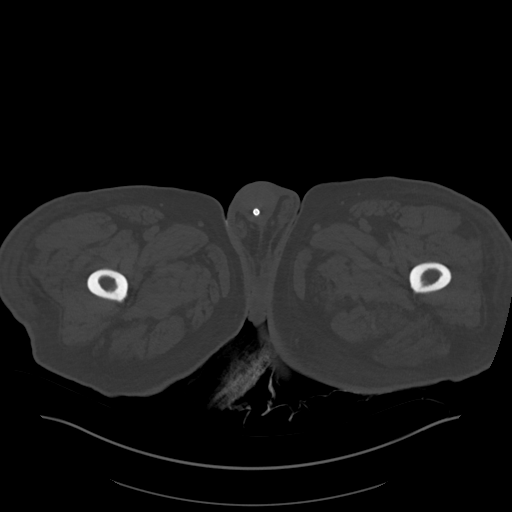
[im 9/65  soft-tissue]
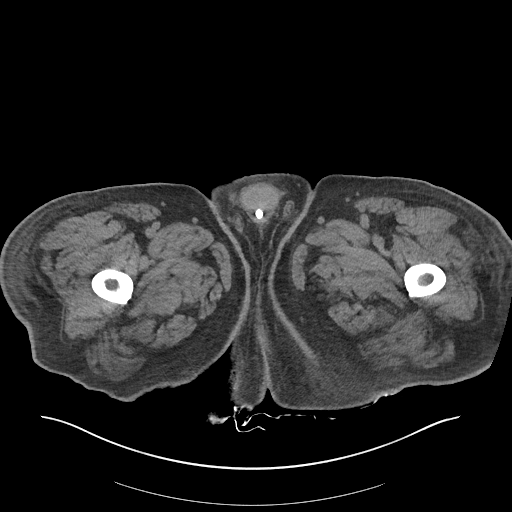
[im 15/65  soft-tissue]
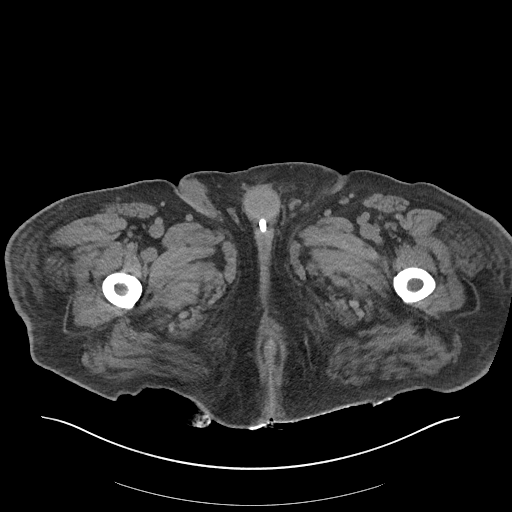
[im 19/65  soft-tissue]
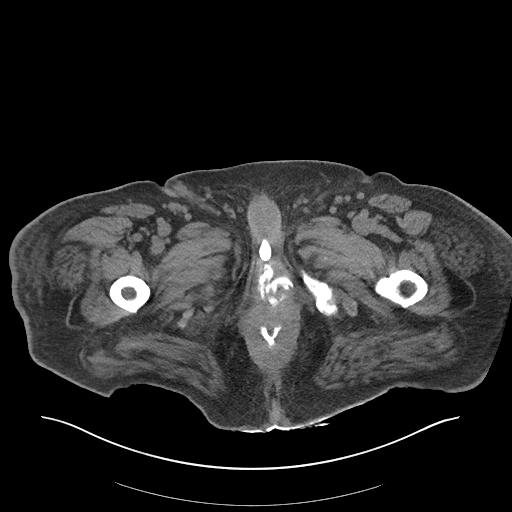
[im 25/65  soft-tissue]
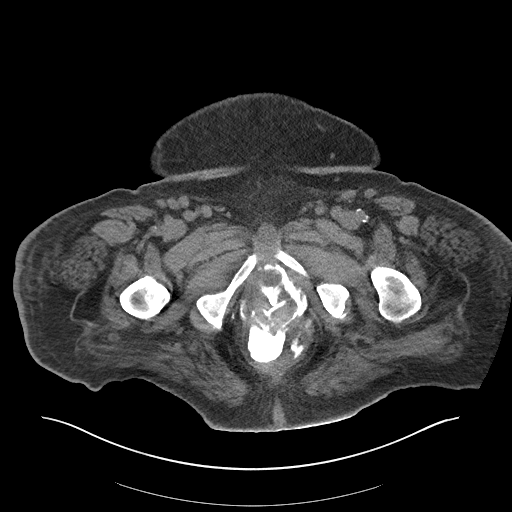
[im 29/65  soft-tissue]
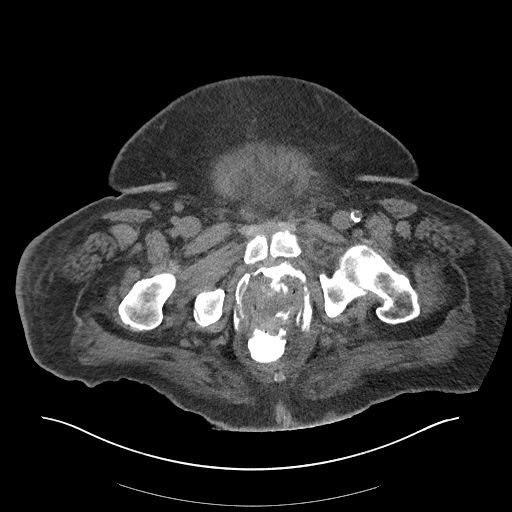
[im 36/65  soft-tissue]
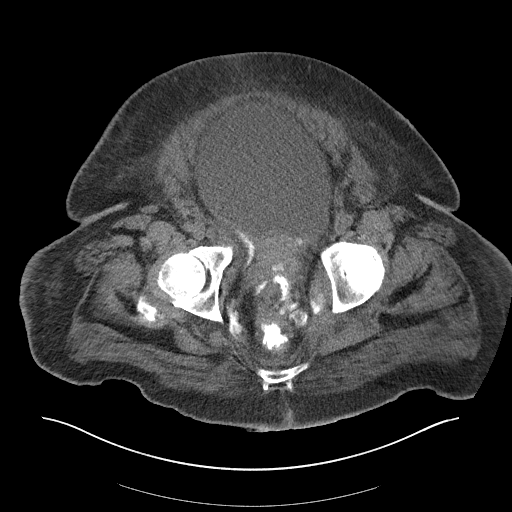
[im 40/65  soft-tissue]
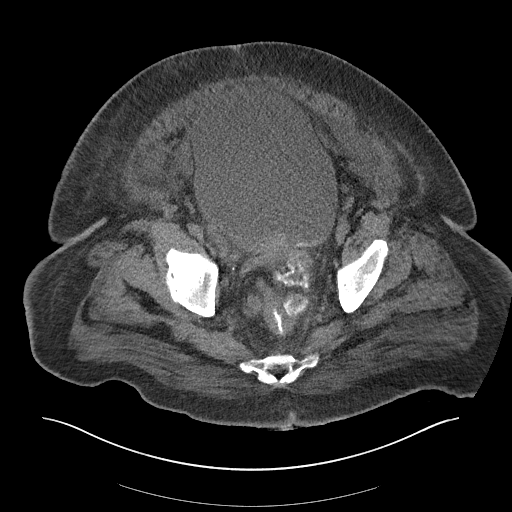
[im 46/65  soft-tissue]
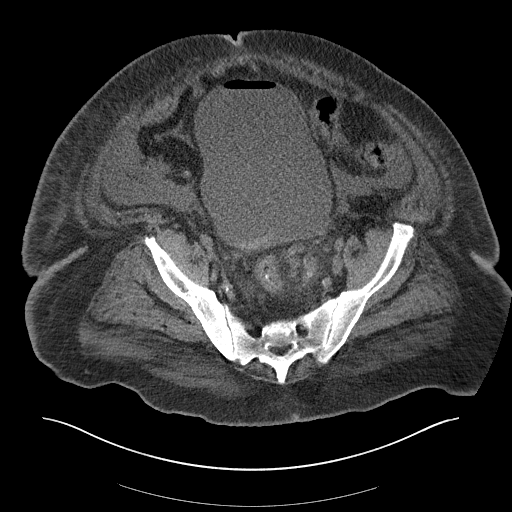
[im 46/65  bone]
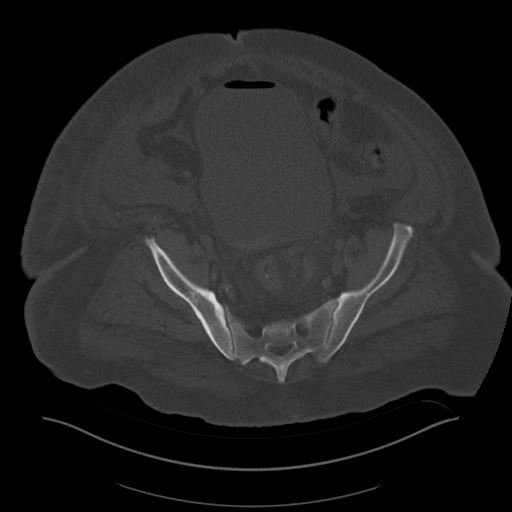
[im 50/65  soft-tissue]
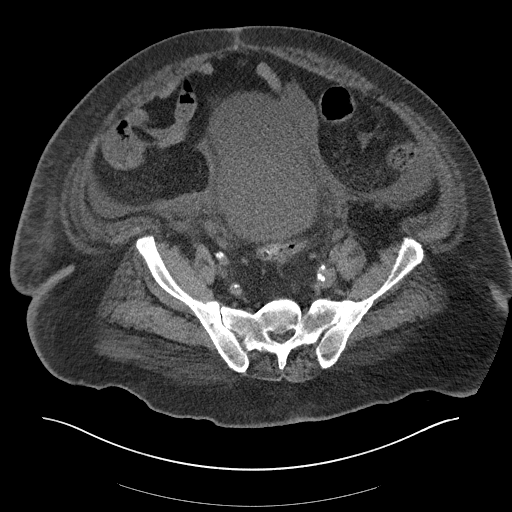
[im 56/65  soft-tissue]
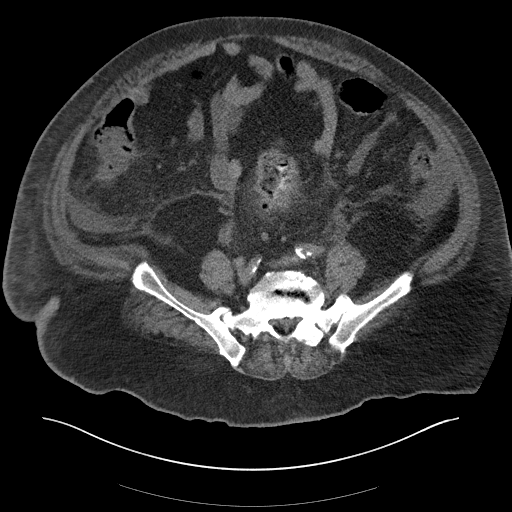
[im 60/65  soft-tissue]
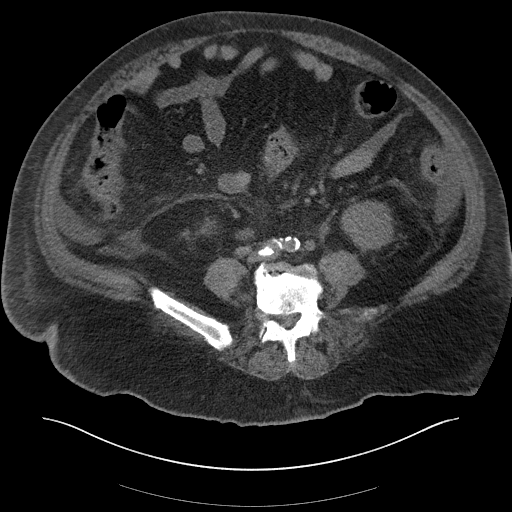

[Series 5: cor st · coronal · 0.66mm/px · 3 of 129 slices shown]
[im 43/129  soft-tissue]
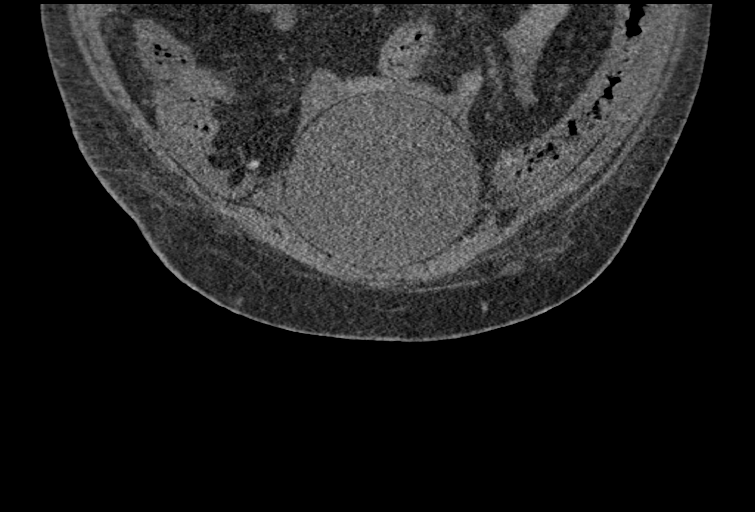
[im 57/129  soft-tissue]
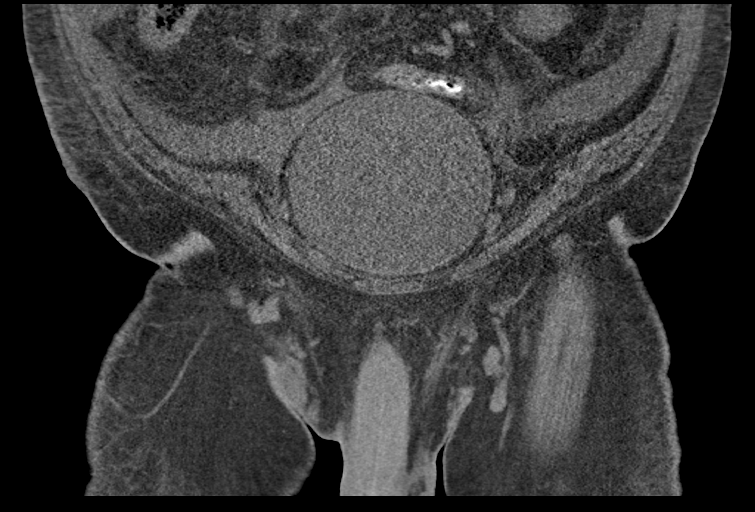
[im 72/129  soft-tissue]
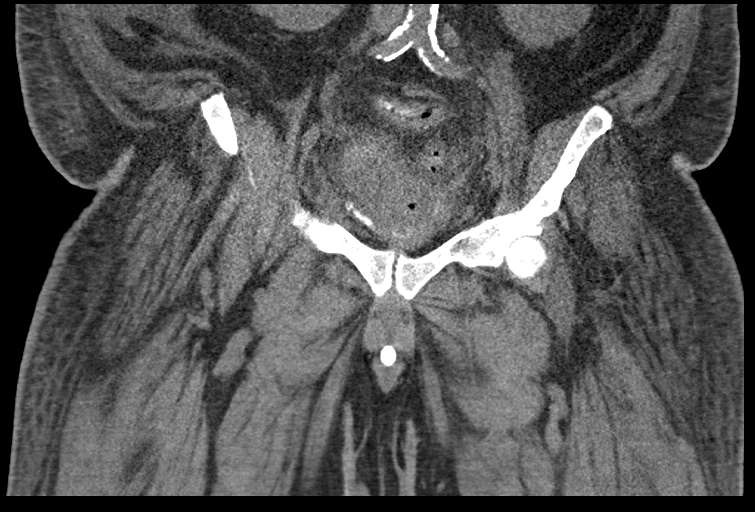

[15 of 46 positions shown; findings below may reference images not displayed]

FINDINGS: Urinary Tract: Foley catheter is inflated at the pelvic floor in the
bulbar urethra. Urethral injury is confirmed with contrast outlining
extraperitoneal spaces of the pelvis, extending along the bladder
base and filling the rectum. Gas is present in the urinary bladder.
There is ascites along the left and right pericolic gutter. Volume
of fluid similar to the recent comparison.

Bowel: Vascular structures not well assessed given lack of
intravenous contrast. Extensive calcified atherosclerotic changes of
the distal abdominal aorta. No adenopathy in the pelvis.

Reproductive: Prostate is not well evaluated. At the level of the
mid prostate or suspected level of the mid prostate there contrast
which extends outside of the area of the prostate extending around
the expected location of the prostate as well, this raises the
question of concomitant prosthetic urethral injury and injury to the
prostate. This cannot be well delineated on the current study.
Prostate could also be quite small and displaced to the right and
anterior to the area of urethral injury.

Other:  Ascites as above.

Musculoskeletal: Spinal degenerative changes without acute or
destructive bone process.
IMPRESSION: 1. Urethral injury is confirmed with contrast outlining
extraperitoneal spaces of the pelvis, extending along the bladder
base, outlining and filling the rectum.
2. Urethral injury likely at the membranous portion of the urethra,
likely at the level of the UG diaphragm. Prostate cannot be assessed
on the current exam and may be displaced by extensive inflammation
in the pelvis. Injury to the prosthetic urethra is also considered.
3. Gas is present in the urinary bladder.
4. There is ascites along the left and right pericolic gutter.
Volume of fluid similar to the recent comparison. Patient has
suspected liver disease. Continued correlation with creatinine
levels may be helpful. There is no extension of contrast into the
peritoneal cavity on today's exam though the urinary bladder is not
opacified and remains distended. Urethral injury and existing Foley
catheter likely contribute to bladder outlet obstruction.
5. Extensive calcified atherosclerotic changes of the distal
abdominal aorta.
6. A call is out to the referring provider to further discuss
findings in the above case.

Aortic Atherosclerosis ([id number]).

## 2020-07-15 ENCOUNTER — Other Ambulatory Visit: Payer: Self-pay | Admitting: Internal Medicine

## 2020-07-17 ENCOUNTER — Ambulatory Visit (HOSPITAL_COMMUNITY): Payer: Self-pay

## 2020-07-18 ENCOUNTER — Ambulatory Visit (HOSPITAL_COMMUNITY): Payer: Self-pay

## 2020-08-16 IMAGING — XA IR CATHETER TUBE CHANGE
1 series · 4 of 4 positions shown · non-contrast
Comparison: Image guided suprapubic catheter placement-01/23/2020;

INDICATION: History of urethral injury post image guided suprapubic catheter
placement on 01/24/2020

and conversion of the drainage pigtail catheter to a balloon
retention suprapubic catheter.
EXAM:
FLUOROSCOPIC GUIDED SUPRAPUBIC CATHETER EXCHANGE

[Series 300: tube placements · 4 of 4 slices shown]
[im 1/4]
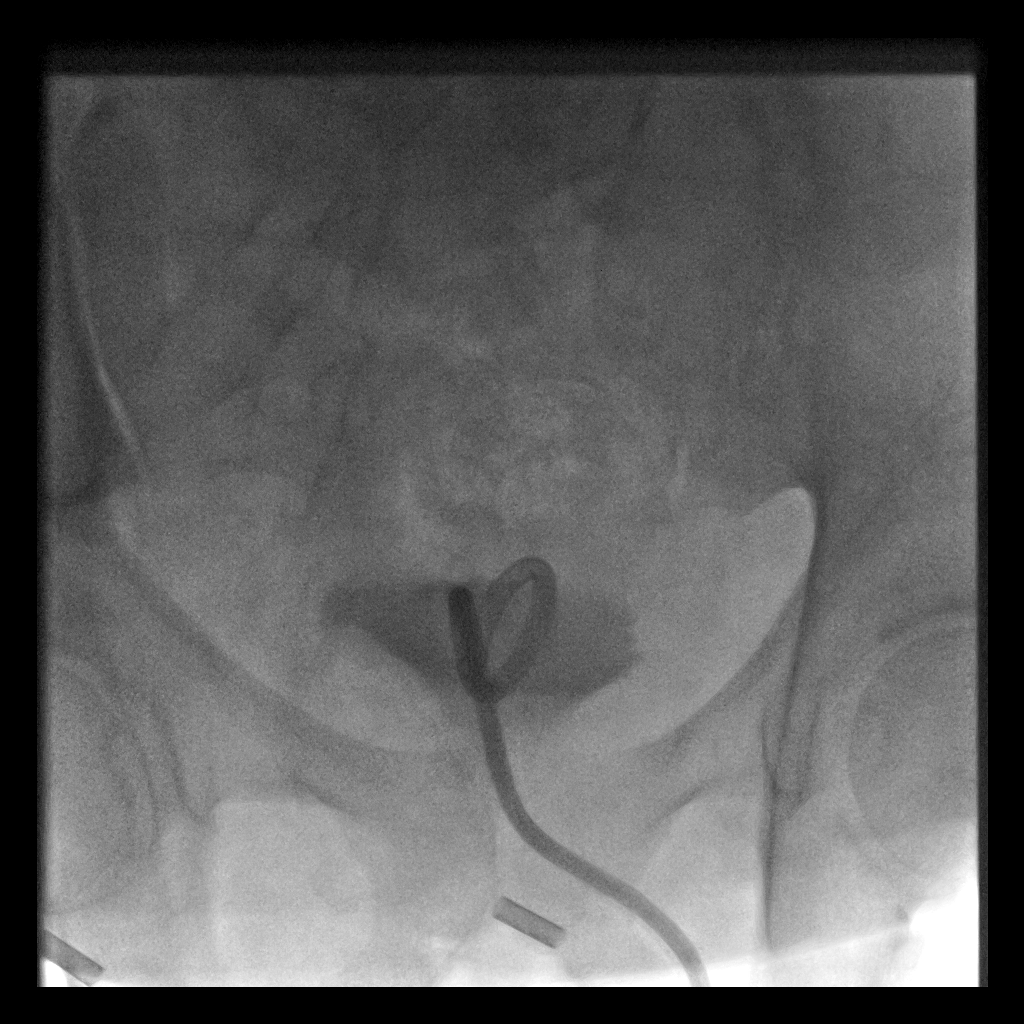
[im 2/4]
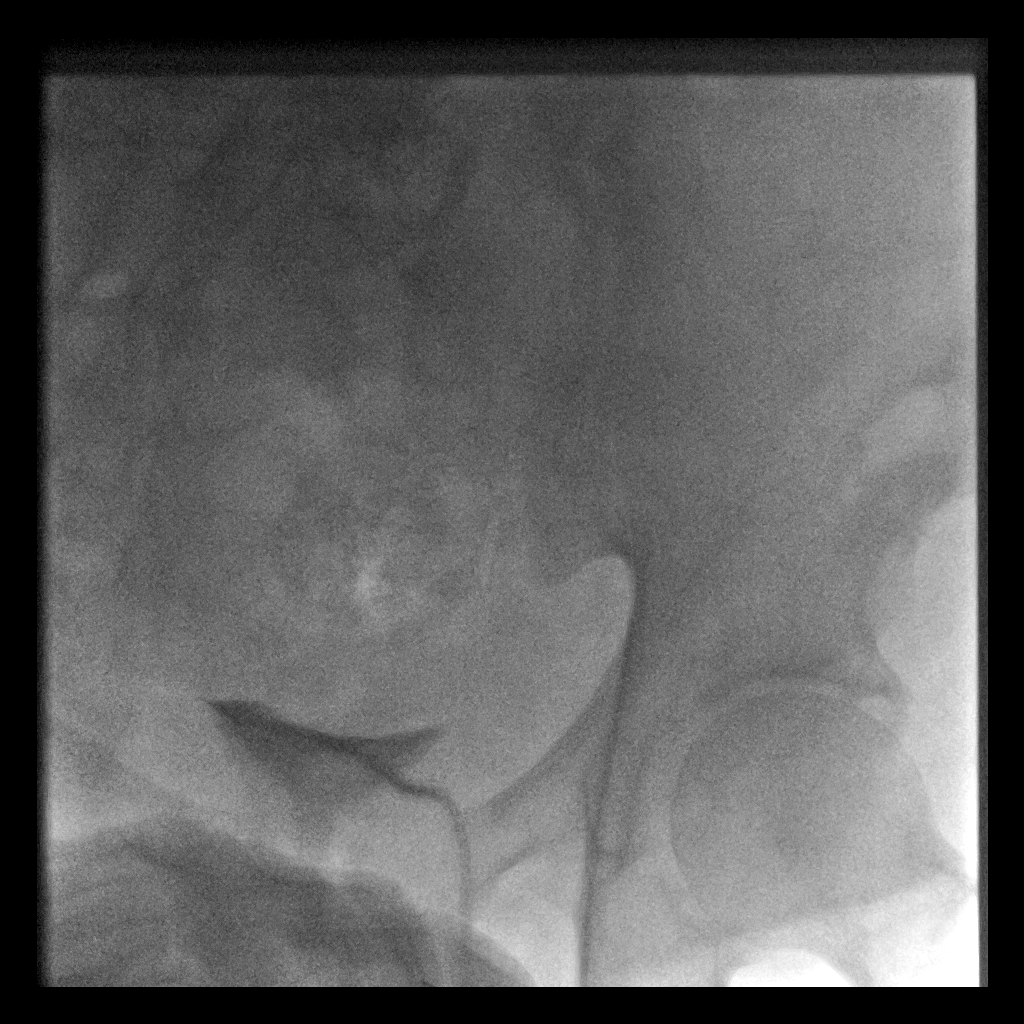
[im 3/4]
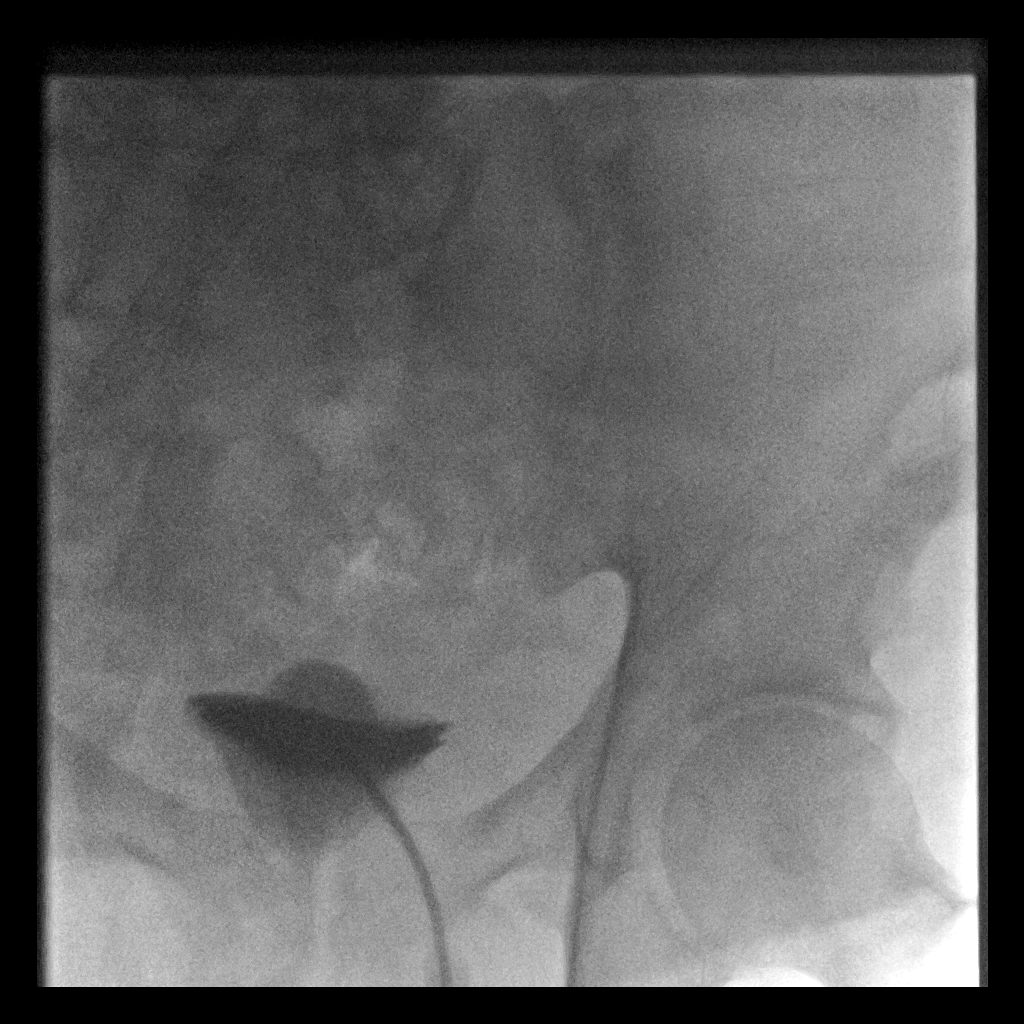
[im 4/4]
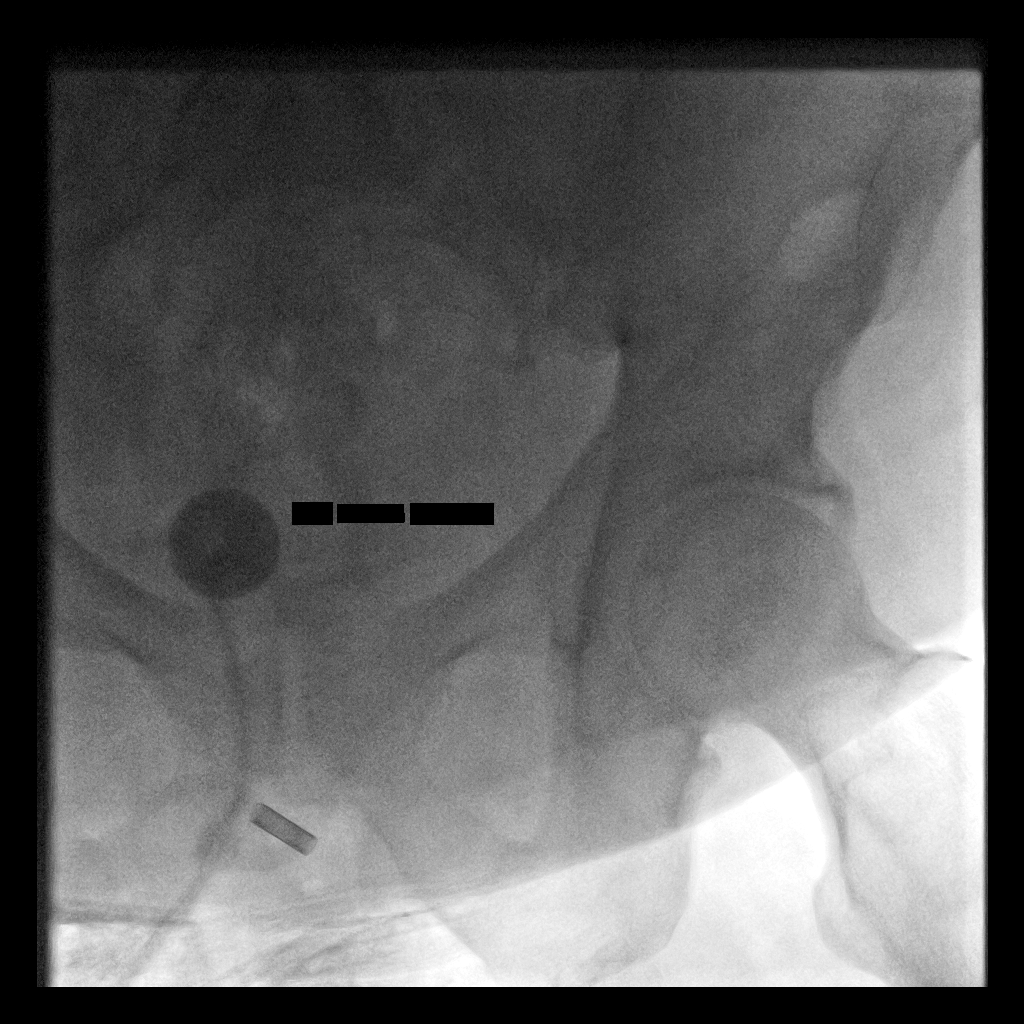

[4 of 4 positions shown; findings below may reference images not displayed]

CT cystogram-02/14/2020

MEDICATIONS:
None

ANESTHESIA/SEDATION:
None

CONTRAST:  None

FLUOROSCOPY TIME:  54 seconds (89 mGy)

COMPLICATIONS:
None immediate.

PROCEDURE:
The procedure, risks, benefits, and alternatives were explained to
the patient. Questions regarding the procedure were encouraged and
answered. The patient understands and consents to the procedure. A
timeout was performed prior to the initiation of the procedure.

The external portion of the existing suprapubic catheter as well as
the surrounding skin were prepped and draped in usual sterile
fashion

A preprocedural spot fluoroscopic image was obtained of the lower
pelvis and existing 14 French all-purpose drainage catheter with end
coiled and locked overlying the expected location of the urinary
bladder.

Contrast injection confirmed appropriate positioning and
functionality existing suprapubic catheter.

The external portion of the existing suprapubic catheter was cut and
cannulated with a short Amplatz wire which was coiled within the
urinary bladder.

Under intermittent fluoroscopic guidance, the existing all-purpose
drainage catheter was exchanged for a new 16 Fr balloon retention
catheter. The balloon was insufflated with 7 cc of saline and dilute
contrast, pulled against the anterior aspect of the urinary bladder
wall and the external disc was cinched.

Appropriate positioning was confirmed with the injection of a small
amount of contrast as well as the efflux of urine.

The catheter was connected to a gravity bag. A dressing was placed.
The patient tolerated the procedure well without immediate
postprocedural complication.
FINDINGS: Preprocedural imaging demonstrates unchanged positioning of
all-purpose drainage catheter with end coiled and locked over the
expected location of the urinary bladder.

Contrast injection confirms appropriate positioning and
functionality of the existing percutaneous drainage catheter.

After fluoroscopic guided exchange, a new 16 Fr balloon retention
catheter is appropriately positioned within the urinary bladder.
IMPRESSION: Technically successful fluoroscopic guided exchange of 16 Fr balloon
retention suprapubic catheter be for the purposes of chronic urinary
bladder decompression.

## 2020-08-20 ENCOUNTER — Emergency Department (HOSPITAL_COMMUNITY)
Admission: EM | Admit: 2020-08-20 | Discharge: 2020-08-20 | Disposition: A | Discharge status: Home / Self Care | Payer: BLUE CROSS/BLUE SHIELD | Attending: Emergency Medicine | Admitting: Emergency Medicine

## 2020-08-20 ENCOUNTER — Other Ambulatory Visit: Payer: Self-pay

## 2020-08-20 ENCOUNTER — Encounter (HOSPITAL_COMMUNITY): Payer: Self-pay

## 2020-08-20 ENCOUNTER — Ambulatory Visit
Admission: RE | Admit: 2020-08-20 | Discharge: 2020-08-20 | Disposition: A | Discharge status: Home / Self Care | Payer: BLUE CROSS/BLUE SHIELD | Source: Ambulatory Visit | Attending: Family Medicine | Admitting: Family Medicine

## 2020-08-20 VITALS — BP 152/94 | HR 87 | Temp 98.1°F | Resp 16 | Ht 69.0 in | Wt 265.0 lb

## 2020-08-20 DIAGNOSIS — Z5321 Procedure and treatment not carried out due to patient leaving prior to being seen by health care provider: Secondary | ICD-10-CM | POA: Insufficient documentation

## 2020-08-20 DIAGNOSIS — H00034 Abscess of left upper eyelid: Secondary | ICD-10-CM | POA: Diagnosis not present

## 2020-08-20 DIAGNOSIS — L03213 Periorbital cellulitis: Secondary | ICD-10-CM

## 2020-08-20 DIAGNOSIS — L0201 Cutaneous abscess of face: Secondary | ICD-10-CM

## 2020-08-20 LAB — COMPREHENSIVE METABOLIC PANEL
ALT: 15 U/L (ref 0–44)
AST: 19 U/L (ref 15–41)
Albumin: 3.6 g/dL (ref 3.5–5.0)
Alkaline Phosphatase: 115 U/L (ref 38–126)
Anion gap: 8 (ref 5–15)
BUN: 8 mg/dL (ref 6–20)
CO2: 25 mmol/L (ref 22–32)
Calcium: 8.9 mg/dL (ref 8.9–10.3)
Chloride: 105 mmol/L (ref 98–111)
Creatinine, Ser: 0.58 mg/dL — ABNORMAL LOW (ref 0.61–1.24)
GFR, Estimated: >60 mL/min (ref >60)
Glucose, Bld: 91 mg/dL (ref 70–99)
Potassium: 4.1 mmol/L (ref 3.5–5.1)
Sodium: 138 mmol/L (ref 135–145)
Total Bilirubin: 1.5 mg/dL — ABNORMAL HIGH (ref 0.3–1.2)
Total Protein: 7.4 g/dL (ref 6.5–8.1)

## 2020-08-20 LAB — CBC WITH DIFFERENTIAL/PLATELET
Abs Immature Granulocytes: 0.04 10*3/uL (ref 0.00–0.07)
Basophils Absolute: 0 10*3/uL (ref 0.0–0.1)
Basophils Relative: 1 %
Eosinophils Absolute: 0.1 10*3/uL (ref 0.0–0.5)
Eosinophils Relative: 2 %
HCT: 41 % (ref 39.0–52.0)
Hemoglobin: 13.7 g/dL (ref 13.0–17.0)
Immature Granulocytes: 1 %
Lymphocytes Relative: 42 %
Lymphs Abs: 3.2 10*3/uL (ref 0.7–4.0)
MCH: 35.2 pg — ABNORMAL HIGH (ref 26.0–34.0)
MCHC: 33.4 g/dL (ref 30.0–36.0)
MCV: 105.4 fL — ABNORMAL HIGH (ref 80.0–100.0)
Monocytes Absolute: 0.5 10*3/uL (ref 0.1–1.0)
Monocytes Relative: 7 %
Neutro Abs: 3.6 10*3/uL (ref 1.7–7.7)
Neutrophils Relative %: 47 %
Platelets: 213 10*3/uL (ref 150–400)
RBC: 3.89 MIL/uL — ABNORMAL LOW (ref 4.22–5.81)
RDW: 15.3 % (ref 11.5–15.5)
WBC: 7.5 10*3/uL (ref 4.0–10.5)
nRBC: 0 % (ref 0.0–0.2)

## 2020-08-20 MED ORDER — LIDOCAINE HCL 1 % IJ SOLN
INTRAMUSCULAR | Status: AC
  Filled 2020-08-20: qty 20

## 2020-08-20 MED ORDER — DOXYCYCLINE HYCLATE 100 MG PO TABS: 100.0000 mg | ORAL_TABLET | Freq: Two times a day (BID) | ORAL | 0 refills | Status: AC

## 2020-08-20 NOTE — ED Triage Notes (Signed)
Patient has recurrent skin infections.   C/o abscess above left eye. Patient seen at [facility] and referred here for possible CT for I/D  A/ox4 Ambulatory in triage.   6/10 pain.

## 2020-08-20 NOTE — ED Provider Notes (Signed)
Emergency Department Provider Note  ____________________________________________  Time seen: Approximately 11:33 AM  I have reviewed the triage vital signs and the nursing notes.   HISTORY  Chief Complaint Recurrent Skin Infections   Historian Patient     HPI DEVAUGHN SAVANT is a 44 y.o. male with a history of diabetes and hypertension, presents to the urgent care with concern for periorbital swelling and erythema with possible abscess along superior aspect of orbital rim. Patient states that he has had some pain with extraocular eye muscle movement. No increased tearing. Patient states that he has been able to keep his eye open. He states that he has a history of multiple cutaneous abscesses. No fever or chills. No history of orbital cellulitis in the past.    Past Medical History:  Diagnosis Date  . Abscess    recurrent infections  . Arthritis   . Diabetes mellitus without complication ([place])   . Hypertension   . Obesity      Immunizations up to date:  Yes.     Past Medical History:  Diagnosis Date  . Abscess    recurrent infections  . Arthritis   . Diabetes mellitus without complication ([place])   . Hypertension   . Obesity     Patient Active Problem List   Diagnosis Date Noted  . Medication monitoring encounter 02/20/2020  . Diabetes ([place]) 02/20/2020  . 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 Acute urinary retention 01/24/2020  . Rectal injury, initial encounter 01/24/2020  . DKA (diabetic ketoacidoses) 01/15/2020  . Abscess 05/26/2014    Past Surgical History:  Procedure Laterality Date  . CYST EXCISION N/A 01/28/2016   Procedure: EXCISION SEBACEOUS CYST FOREHEAD X 2;  Surgeon: Donnie Mesa, MD;  Location: [facility];  Service: General;  Laterality: N/A;  . CYST REMOVAL TRUNK Left 01/28/2016   Procedure: EXCISION SEBACEOUS CYST LEFT SHOULDER AND MID-BACK;  Surgeon: Donnie Mesa, MD;  Location: [facility];  Service: General;  Laterality:  Left;  . HYDRADENITIS EXCISION Right 01/28/2016   Procedure: EXCISION HIDRADENITIS RIGHT AXILLA ;  Surgeon: Donnie Mesa, MD;  Location: [facility];  Service: General;  Laterality: Right;  . HYDRADENITIS EXCISION N/A 01/28/2016   Procedure: EXCISION HIDRADENITIS POSTERIOR NECK ;  Surgeon: Donnie Mesa, MD;  Location: [facility];  Service: General;  Laterality: N/A;  . IR CATHETER TUBE CHANGE  02/25/2020  . IR CATHETER TUBE CHANGE  06/02/2020    Prior to Admission medications   Medication Sig Start Date End Date Taking? Authorizing Provider  allopurinol (ZYLOPRIM) 300 MG tablet Take 300 mg by mouth daily. 11/16/15   [provider]  blood glucose meter kit and supplies KIT Dispense based on patient and insurance preference. Use up to four times daily as directed. (FOR ICD-9 250.00, 250.01). 01/20/20   Antonieta Pert, MD  busPIRone (BUSPAR) 10 MG tablet Take 10 mg by mouth 3 (three) times daily. 09/10/19   [provider]  clindamycin (CLINDAGEL) 1 % gel Apply 1 application topically See admin instructions. Apply to bumps on face and body 1-2 times daily as needed. 12/06/19   [provider]  doxycycline (VIBRA-TABS) 100 MG tablet Take 1 tablet (100 mg total) by mouth 2 (two) times daily. 01/27/20   [name], MD  ferrous sulfate 325 (65 FE) MG tablet Take 325 mg by mouth daily.    [provider]  furosemide (LASIX) 20 MG tablet Take 20 mg by mouth daily. 02/11/20   [provider]  gabapentin (NEURONTIN) 600 MG tablet Take 600 mg by mouth 3 (three) times daily. 12/31/19   [provider]  ibuprofen (ADVIL) 800 MG tablet Take 800 mg by mouth every 8 (eight) hours as needed.    [provider]  Insulin Pen Needle (PEN NEEDLES 3/16") 31G X 5 MM MISC USE WITH INSULIN PEN 01/20/20   Antonieta Pert, MD  levofloxacin (LEVAQUIN) 500 MG tablet Take 1 tablet (500 mg total) by mouth daily. 07/01/20   [name], MD  Magnesium 200 MG TABS Take 200 mg by mouth daily.     [provider]  metoprolol succinate (TOPROL-XL) 25 MG 24 hr tablet Take 25 mg by mouth daily. 12/19/15   [provider]  Milk Thistle Extract 175 MG TABS Take 175 mg by mouth daily.    [provider]  Omega-3 1000 MG CAPS Take 1 capsule by mouth daily.    [provider]  sildenafil (REVATIO) 20 MG tablet SMARTSIG:1-5 Tablet(s) By Mouth Daily PRN 05/18/20   [provider]  triamcinolone cream (KENALOG) 0.1 % Apply 1 application topically 2 (two) times daily as needed.  12/19/15   [provider]  Vitamin D, Ergocalciferol, (DRISDOL) 1.25 MG (50000 UNIT) CAPS capsule Take 50,000 Units by mouth once a week. 12/31/19   [provider]  insulin aspart (NOVOLOG FLEXPEN) 100 UNIT/ML FlexPen Inject 4 Units into the skin 3 (three) times daily with meals. 01/24/20 07/01/20  [name], MD  insulin glargine (LANTUS SOLOSTAR) 100 UNIT/ML Solostar Pen Inject 10 Units into the skin 2 (two) times daily. 01/24/20 04/23/20  [name], MD    Allergies Amoxicillin and Other  Family History  Problem Relation Age of Onset  . Cancer Father     Social History Social History   Tobacco Use  . Smoking status: Current Every Day Smoker    Packs/day: 0.50    Types: Cigarettes  . Smokeless tobacco: Never Used  Vaping Use  . Vaping Use: Never used  Substance Use Topics  . Alcohol use: Yes    Comment: 16oz beer a day  fifth of liquor over  q 3-4 days  . Drug use: Yes    Frequency: 2.0 times per week    Types: Marijuana    Comment: 01/26/16     Review of Systems  Constitutional: No fever/chills Eyes:  No discharge ENT: No upper respiratory complaints. Respiratory: no cough. No SOB/ use of accessory muscles to breath Gastrointestinal:   No nausea, no vomiting.  No diarrhea.  No constipation. Musculoskeletal: Negative for musculoskeletal pain. Skin: Patient has left-sided periorbital edema and erythema with possible abscess at the  superior aspect of left orbital ridge.     ____________________________________________   PHYSICAL EXAM:  VITAL SIGNS: ED Triage Vitals  Enc Vitals Group     BP 08/20/20 1113 (!) 152/94     Pulse Rate 08/20/20 1113 87     Resp 08/20/20 1113 16     Temp 08/20/20 1113 98.1 F (36.7 C)     Temp Source 08/20/20 1113 Oral     SpO2 08/20/20 1113 98 %     Weight 08/20/20 1116 265 lb (120.2 kg)     Height 08/20/20 1116 _0  (1.753 m)     Head Circumference --      Peak Flow --      Pain Score 08/20/20 1115 6     Pain Loc --      Pain Edu? --  Excl. in [place]? --      Constitutional: Alert and oriented. Well appearing and in no acute distress. Eyes: Conjunctivae are normal. PERRL. EOMI. patient has left-sided periorbital edema that extends to inferior orbital rim. Patient has erythema with palpable fluctuance along the superior aspect of orbital ridge. Head: Atraumatic. ENT:      Nose: No congestion/rhinnorhea.      Mouth/Throat: Mucous membranes are moist.  Neck: No stridor.  No cervical spine tenderness to palpation. Cardiovascular: Normal rate, regular rhythm. Normal S1 and S2.  Good peripheral circulation. Respiratory: Normal respiratory effort without tachypnea or retractions. Lungs CTAB. Good air entry to the bases with no decreased or absent breath sounds Skin:  Skin is warm, dry and intact. No rash noted. Psychiatric: Mood and affect are normal for age. Speech and behavior are normal.   ____________________________________________   LABS (all labs ordered are listed, but only abnormal results are displayed)  Labs Reviewed - No data to display ____________________________________________  EKG   ____________________________________________  RADIOLOGY   No results found.  ____________________________________________    PROCEDURES  Procedure(s) performed:     Procedures     Medications - No data to  display   ____________________________________________   INITIAL IMPRESSION / ASSESSMENT AND PLAN / ED COURSE  Pertinent labs & imaging results that were available during my care of the patient were reviewed by me and considered in my medical decision making (see chart for details).      Assessment and plan Abscess Periorbital swelling and erythema 44 year old male presents to the emergency department with left-sided periorbital edema and erythema with possible abscess along the superior aspect of the orbital ridge.  Patient was hypertensive at triage but vital signs were otherwise reassuring. Given complex medical history including diabetes and lack of CT access, patient was referred to the emergency department for further work-up and management as patient is endorsing left eye pain with extraocular eye muscle movement and I am concerned for possible septal involvement.  Patient was given shortness emergency department wait times. All patient questions were answered before patient was discharged from urgent care.     ____________________________________________  FINAL CLINICAL IMPRESSION(S) / ED DIAGNOSES  Final diagnoses:  Periorbital cellulitis of left eye      NEW MEDICATIONS STARTED DURING THIS VISIT:  ED Discharge Orders    None          This chart was dictated using voice recognition software/Dragon. Despite best efforts to proofread, errors can occur which can change the meaning. Any change was purely unintentional.     Lannie Fields, PA-C 08/20/20 1141

## 2020-08-20 NOTE — ED Triage Notes (Signed)
Pt has hx of recurrent boils and has been seeing dermatologist for his skin but for 4 days left eye has an obvious boil with swelling. Pt said he has been using hot compresses. No relief

## 2020-08-20 NOTE — Discharge Instructions (Addendum)
Please follow-up with your dermatologist as well as her primary care doctor.  Take the antibiotic as prescribed.  If you develop worsening swelling, any difficulty or pain with eye movement, generalized redness, return to ER for reassessment.

## 2020-08-20 NOTE — Discharge Instructions (Addendum)
Please go straight to emergency department.

## 2020-08-20 NOTE — ED Notes (Signed)
Patient states his Benedetto Goad arrived and he could not wait anymore. Patient walked out of ED despite this RN instructing patient to wait until labs resulted.

## 2020-09-02 ENCOUNTER — Other Ambulatory Visit (HOSPITAL_COMMUNITY): Payer: BLUE CROSS/BLUE SHIELD

## 2020-09-10 ENCOUNTER — Other Ambulatory Visit: Payer: Self-pay

## 2020-09-10 ENCOUNTER — Ambulatory Visit
Admission: RE | Admit: 2020-09-10 | Discharge: 2020-09-10 | Disposition: A | Discharge status: Home / Self Care | Payer: BLUE CROSS/BLUE SHIELD | Source: Ambulatory Visit | Attending: Emergency Medicine | Admitting: Emergency Medicine

## 2020-09-10 VITALS — BP 164/101 | HR 109 | Temp 98.0°F | Resp 16 | Ht 69.0 in | Wt 265.0 lb

## 2020-09-10 DIAGNOSIS — I10 Essential (primary) hypertension: Secondary | ICD-10-CM | POA: Diagnosis not present

## 2020-09-10 MED ORDER — METOPROLOL SUCCINATE ER 25 MG PO TB24: 25.0000 mg | ORAL_TABLET | Freq: Every day | ORAL | 0 refills | Status: AC

## 2020-09-10 MED ORDER — AMLODIPINE BESYLATE 5 MG PO TABS: 5.0000 mg | ORAL_TABLET | Freq: Every day | ORAL | 0 refills | Status: AC

## 2020-09-10 NOTE — ED Provider Notes (Signed)
[name]    CSN: [id number] Arrival date & time: 09/10/20  1203      History   Chief Complaint Chief Complaint  Patient presents with  . Hypertension    HPI Glenn Murphy is a 44 y.o. male history of hypertension, DM type II, presenting today for evaluation of his blood pressure.  Reports his blood pressure has continued to be elevated yesterday taking his medicines of recently.  Is on metoprolol 25 mg which he has been taking over the past few years.  He has been out of this over the past 3 days, but regularly checks his pressure while on this medicine and notes that systolic is typically 1 45-1 65 and diastolic 95-1 05.  He went to the dentist for procedure and was told he could not have this procedure until blood pressure better controlled.  He reports he has been unable to get in with his PCP recently and has run out of his meds.  He denies any chest pain shortness of breath.  Denies headaches or vision changes.  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  Currently seeing urology for this.  HPI  Past Medical History:  Diagnosis Date  . Abscess    recurrent infections  . Arthritis   . Diabetes mellitus without complication (HCC)   . Hypertension   . Obesity     Patient Active Problem List   Diagnosis Date Noted  . Medication monitoring encounter 02/20/2020  . Diabetes (HCC) 02/20/2020  . 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 Acute urinary retention 01/24/2020  . Rectal injury, initial encounter 01/24/2020  . DKA (diabetic ketoacidoses) 01/15/2020  . Abscess 05/26/2014    Past Surgical History:  Procedure Laterality Date  . CYST EXCISION N/A 01/28/2016   Procedure: EXCISION SEBACEOUS CYST FOREHEAD X 2;  Surgeon: [name], MD;  Location: [facility] OR;  Service: General;  Laterality: N/A;  . CYST REMOVAL TRUNK Left 01/28/2016   Procedure: EXCISION SEBACEOUS CYST LEFT SHOULDER AND MID-BACK;  Surgeon: [name], MD;   Location: [facility] OR;  Service: General;  Laterality: Left;  . HYDRADENITIS EXCISION Right 01/28/2016   Procedure: EXCISION HIDRADENITIS RIGHT AXILLA ;  Surgeon: [name], MD;  Location: [facility] OR;  Service: General;  Laterality: Right;  . HYDRADENITIS EXCISION N/A 01/28/2016   Procedure: EXCISION HIDRADENITIS POSTERIOR NECK ;  Surgeon: [name], MD;  Location: [facility] OR;  Service: General;  Laterality: N/A;  . IR CATHETER TUBE CHANGE  02/25/2020  . IR CATHETER TUBE CHANGE  06/02/2020       Home Medications    Prior to Admission medications   Medication Sig Start Date End Date Taking? Authorizing Provider  allopurinol (ZYLOPRIM) 300 MG tablet Take 300 mg by mouth daily. 11/16/15   [provider]  amLODipine (NORVASC) 5 MG tablet Take 1 tablet (5 mg total) by mouth daily. 09/10/20   Carrick Rijos C, PA-C  blood glucose meter kit and supplies KIT Dispense based on patient and insurance preference. Use up to four times daily as directed. (FOR ICD-9 250.00, 250.01). 01/20/20   Lanae Boast, MD  busPIRone (BUSPAR) 10 MG tablet Take 10 mg by mouth 3 (three) times daily. 09/10/19   [provider]  clindamycin (CLINDAGEL) 1 % gel Apply 1 application topically See admin instructions. Apply to bumps on face and body 1-2 times daily as needed. 12/06/19   [provider]  ferrous sulfate 325 (65 FE) MG tablet Take 325 mg by mouth  daily.    [provider]  furosemide (LASIX) 20 MG tablet Take 20 mg by mouth daily. 02/11/20   [provider]  gabapentin (NEURONTIN) 600 MG tablet Take 600 mg by mouth 3 (three) times daily. 12/31/19   [provider]  ibuprofen (ADVIL) 800 MG tablet Take 800 mg by mouth every 8 (eight) hours as needed.    [provider]  Insulin Pen Needle (PEN NEEDLES 3/16") 31G X 5 MM MISC USE WITH INSULIN PEN 01/20/20   Antonieta Pert, MD  levofloxacin (LEVAQUIN) 500 MG tablet Take 1 tablet (500 mg total) by mouth daily. 07/01/20   [name], MD  Magnesium 200 MG TABS Take 200 mg by mouth daily.    [provider]  metoprolol succinate (TOPROL-XL) 25 MG 24 hr tablet Take 1 tablet (25 mg total) by mouth daily. 09/10/20   Krisi Azua C, PA-C  Milk Thistle Extract 175 MG TABS Take 175 mg by mouth daily.    [provider]  Omega-3 1000 MG CAPS Take 1 capsule by mouth daily.    [provider]  sildenafil (REVATIO) 20 MG tablet SMARTSIG:1-5 Tablet(s) By Mouth Daily PRN 05/18/20   [provider]  triamcinolone cream (KENALOG) 0.1 % Apply 1 application topically 2 (two) times daily as needed.  12/19/15   [provider]  Vitamin D, Ergocalciferol, (DRISDOL) 1.25 MG (50000 UNIT) CAPS capsule Take 50,000 Units by mouth once a week. 12/31/19   [provider]  insulin aspart (NOVOLOG FLEXPEN) 100 UNIT/ML FlexPen Inject 4 Units into the skin 3 (three) times daily with meals. 01/24/20 07/01/20  [name], MD  insulin glargine (LANTUS SOLOSTAR) 100 UNIT/ML Solostar Pen Inject 10 Units into the skin 2 (two) times daily. 01/24/20 04/23/20  [name], MD    Family History Family History  Problem Relation Age of Onset  . Cancer Father     Social History Social History   Tobacco Use  . Smoking status: Current Every Day Smoker    Packs/day: 0.50    Types: Cigarettes  . Smokeless tobacco: Never Used  Vaping Use  . Vaping Use: Never used  Substance Use Topics  . Alcohol use: Yes    Comment: 16oz beer a day  fifth of liquor over  q 3-4 days  . Drug use: Yes    Frequency: 2.0 times per week    Types: Marijuana    Comment: 01/26/16     Allergies   Amoxicillin and Other   Review of Systems Review of Systems  Constitutional: Negative for fatigue and fever.  HENT: Negative for congestion, sinus pressure and sore throat.   Eyes: Negative for photophobia, pain and visual disturbance.  Respiratory: Negative for cough and shortness of breath.   Cardiovascular: Negative  for chest pain.  Gastrointestinal: Negative for abdominal pain, nausea and vomiting.  Genitourinary: Negative for decreased urine volume and hematuria.  Musculoskeletal: Negative for myalgias, neck pain and neck stiffness.  Neurological: Negative for dizziness, syncope, facial asymmetry, speech difficulty, weakness, light-headedness, numbness and headaches.     Physical Exam Triage Vital Signs ED Triage Vitals  Enc Vitals Group     BP      Pulse      Resp      Temp      Temp src      SpO2      Weight      Height      Head Circumference  Peak Flow      Pain Score      Pain Loc      Pain Edu?      Excl. in [place]?    No data found.  Updated Vital Signs BP (!) 164/101 (BP Location: Right Arm)   Pulse (!) 109   Temp 98 F (36.7 C) (Oral)   Resp 16   Ht $R'5\' 9"'wz$  (1.753 m)   Wt 265 lb (120.2 kg)   SpO2 97%   BMI 39.13 kg/m   Visual Acuity Right Eye Distance:   Left Eye Distance:   Bilateral Distance:    Right Eye Near:   Left Eye Near:    Bilateral Near:     Physical Exam Vitals and nursing note reviewed.  Constitutional:      Appearance: He is well-developed.     Comments: No acute distress  HENT:     Head: Normocephalic and atraumatic.     Nose: Nose normal.  Eyes:     Extraocular Movements: Extraocular movements intact.     Conjunctiva/sclera: Conjunctivae normal.     Pupils: Pupils are equal, round, and reactive to light.  Cardiovascular:     Rate and Rhythm: Normal rate and regular rhythm.  Pulmonary:     Effort: Pulmonary effort is normal. No respiratory distress.     Comments: Breathing comfortably at rest, CTABL, no wheezing, rales or other adventitious sounds auscultated Abdominal:     General: There is no distension.  Musculoskeletal:        General: Normal range of motion.     Cervical back: Neck supple.  Skin:    General: Skin is warm and dry.  Neurological:     General: No focal deficit present.     Mental Status: He is alert and  oriented to person, place, and time. Mental status is at baseline.     Cranial Nerves: No cranial nerve deficit.     Motor: No weakness.      UC Treatments / Results  Labs (all labs ordered are listed, but only abnormal results are displayed) Labs Reviewed - No data to display  EKG   Radiology No results found.  Procedures Procedures (including critical care time)  Medications Ordered in UC Medications - No data to display  Initial Impression / Assessment and Plan / UC Course  I have reviewed the triage vital signs and the nursing notes.  Pertinent labs & imaging results that were available during my care of the patient were reviewed by me and considered in my medical decision making (see chart for details).     Uncontrolled hypertension-refilling metoprolol, will add in amlodipine 5 mg.  Discussed close monitoring of blood pressure to ensure blood pressure not dropping too much and becoming symptomatic from lower pressures.  Stressed importance of PCP follow-up.  Currently asymptomatic.  Discussed strict return precautions. Patient verbalized understanding and is agreeable with plan.  Final Clinical Impressions(s) / UC Diagnoses   Final diagnoses:  Essential hypertension     Discharge Instructions     Metoprolol refilled Start amlodipine daily Monitor pressures and follow up with primary care    ED Prescriptions    Medication Sig Dispense Auth. Provider   metoprolol succinate (TOPROL-XL) 25 MG 24 hr tablet Take 1 tablet (25 mg total) by mouth daily. 90 tablet Socrates Cahoon C, PA-C   amLODipine (NORVASC) 5 MG tablet Take 1 tablet (5 mg total) by mouth daily. 30 tablet Corry Ihnen, [place] C, PA-C  PDMP not reviewed this encounter.   Janith Lima, [place] 09/10/20 1241

## 2020-09-10 NOTE — Discharge Instructions (Signed)
Metoprolol refilled Start amlodipine daily Monitor pressures and follow up with primary care

## 2020-09-10 NOTE — ED Triage Notes (Signed)
Pt said he went to dentist about 3 weeks ago and they would not do procedure due to blood pressures. Pt said he has a pcp said he needed to be seen here or ED for pressures . Said his pcp is in the process of a lot of change and hard to get in and see pcp. Pt said he ran out of his blood meds. 3 days ago. No Sob, no chest pain

## 2020-09-11 ENCOUNTER — Ambulatory Visit (HOSPITAL_COMMUNITY)
Admission: RE | Admit: 2020-09-11 | Discharge: 2020-09-11 | Disposition: A | Discharge status: Home / Self Care | Payer: BLUE CROSS/BLUE SHIELD | Source: Ambulatory Visit | Attending: Interventional Radiology | Admitting: Interventional Radiology

## 2020-09-11 DIAGNOSIS — N360 Urethral fistula: Secondary | ICD-10-CM

## 2020-09-11 DIAGNOSIS — Z435 Encounter for attention to cystostomy: Secondary | ICD-10-CM | POA: Diagnosis present

## 2020-09-11 HISTORY — PX: IR CATHETER TUBE CHANGE: IMG717

## 2020-09-11 MED ORDER — IOHEXOL 300 MG/ML  SOLN
50.0000 mL | Freq: Once | INTRAMUSCULAR | Status: AC | PRN
  Administered 2020-09-11: 5 mL

## 2020-09-14 ENCOUNTER — Other Ambulatory Visit (HOSPITAL_COMMUNITY): Payer: Self-pay | Admitting: Interventional Radiology

## 2020-09-14 DIAGNOSIS — N360 Urethral fistula: Secondary | ICD-10-CM

## 2020-09-14 DIAGNOSIS — Z9359 Other cystostomy status: Secondary | ICD-10-CM

## 2020-10-21 ENCOUNTER — Ambulatory Visit (HOSPITAL_COMMUNITY)
Admission: RE | Admit: 2020-10-21 | Discharge: 2020-10-21 | Disposition: A | Discharge status: Home / Self Care | Payer: BLUE CROSS/BLUE SHIELD | Source: Ambulatory Visit | Attending: Interventional Radiology | Admitting: Interventional Radiology

## 2020-10-21 ENCOUNTER — Other Ambulatory Visit: Payer: Self-pay

## 2020-10-21 ENCOUNTER — Other Ambulatory Visit (HOSPITAL_COMMUNITY): Payer: Self-pay | Admitting: Interventional Radiology

## 2020-10-21 DIAGNOSIS — Z9359 Other cystostomy status: Secondary | ICD-10-CM

## 2020-10-21 DIAGNOSIS — Z435 Encounter for attention to cystostomy: Secondary | ICD-10-CM | POA: Insufficient documentation

## 2020-10-21 DIAGNOSIS — N360 Urethral fistula: Secondary | ICD-10-CM

## 2020-10-21 HISTORY — PX: IR CATHETER TUBE CHANGE: IMG717

## 2020-10-21 MED ORDER — IOHEXOL 300 MG/ML  SOLN
50.0000 mL | Freq: Once | INTRAMUSCULAR | Status: AC | PRN
  Administered 2020-10-21: 15 mL

## 2020-10-21 MED ORDER — LIDOCAINE VISCOUS HCL 2 % MT SOLN
OROMUCOSAL | Status: AC | PRN
  Administered 2020-10-21: 15 mL via ORAL

## 2020-10-21 MED ORDER — LIDOCAINE VISCOUS HCL 2 % MT SOLN
OROMUCOSAL | Status: AC
  Filled 2020-10-21: qty 15

## 2020-10-21 NOTE — Procedures (Signed)
Pre Procedure Dx: Uretheral fisutal Post Procedure Dx: Same  Technically successful fluoroscopic guided exchange and up sizing of now 18 Fr balloon retention suprapubic catheter for the purposes of chronic urinary bladder decompression.    PLAN:  Patient will return for routine fluoroscopic guided exchange in 8 weeks.  Consideration for continual up sizing of the suprapubic catheter could be performed as indicated.     EBL: None No immediate complications.   Katherina Right, MD Pager #: [phone]

## 2020-10-22 ENCOUNTER — Ambulatory Visit: Payer: Self-pay

## 2020-11-12 ENCOUNTER — Telehealth: Payer: Self-pay | Admitting: Student

## 2020-11-12 NOTE — Telephone Encounter (Signed)
IR.  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  Received message from patient's wife regarding drainage from SPT. Called patient's wife back, spoke to both patient and wife regarding below issues. Patient states that his SPT has been "clogged" since most recent exchange 10/21/2020. States that SPT worked for approximately 1 day, and since he has had no output into bag. States he is able to "pee from my anus and penis if I push hard". States he has attempted to flush SPT with water without success of clearing tube. In addition, states over the past 4-5 days he has noticed bleeding and "pus" draining from SPT insertion site to the point that it leaves "marks on his shirt".  Discussed case with Dr. Grace Isaac who recommends patient come to [facility] IR tomorrow 11/13/2020 for SPT evaluation/exchange. IR scheduler to call patient regarding appointment tomorrow.  Please call IR with questions/concerns.   Waylan Boga Tonyetta Berko, PA-C 11/12/2020, 2:18 PM

## 2020-11-13 ENCOUNTER — Other Ambulatory Visit (HOSPITAL_COMMUNITY): Payer: Self-pay | Admitting: Interventional Radiology

## 2020-11-13 ENCOUNTER — Other Ambulatory Visit: Payer: Self-pay

## 2020-11-13 ENCOUNTER — Ambulatory Visit (HOSPITAL_COMMUNITY)
Admission: RE | Admit: 2020-11-13 | Discharge: 2020-11-13 | Disposition: A | Discharge status: Home / Self Care | Payer: BLUE CROSS/BLUE SHIELD | Source: Ambulatory Visit | Attending: Interventional Radiology | Admitting: Interventional Radiology

## 2020-11-13 DIAGNOSIS — N2889 Other specified disorders of kidney and ureter: Secondary | ICD-10-CM | POA: Insufficient documentation

## 2020-11-13 DIAGNOSIS — N360 Urethral fistula: Secondary | ICD-10-CM

## 2020-11-13 DIAGNOSIS — Z9359 Other cystostomy status: Secondary | ICD-10-CM

## 2020-11-13 HISTORY — PX: IR CYSTOGRAM: IMG6099

## 2020-11-13 MED ORDER — SODIUM CHLORIDE (PF) 0.9 % IJ SOLN
INTRAMUSCULAR | Status: AC
  Filled 2020-11-13: qty 10

## 2020-11-13 MED ORDER — LIDOCAINE VISCOUS HCL 2 % MT SOLN
OROMUCOSAL | Status: AC
  Filled 2020-11-13: qty 15

## 2020-11-13 MED ORDER — IOHEXOL 300 MG/ML  SOLN
50.0000 mL | Freq: Once | INTRAMUSCULAR | Status: AC | PRN
  Administered 2020-11-13: 15 mL

## 2020-11-13 NOTE — Procedures (Signed)
Interventional Radiology Procedure Note  Procedure: Cystostomy tube check  Findings: Please refer to procedural dictation for full description.  Cystostomy in good position, no purulence at skin entry site.  Contrast flows freely into urinary bladder.  Urine sample obtained for culture.    He has upcoming definitive surgery for his fistula coming up soon at [facility].  He wishes to not have the tube exchanged today, as the main purpose of his tube is not for drainage currently as he is urinary voluntarily from his penile urethra and anus.  Complications: None immediate  Estimated Blood Loss: None  Recommendations: Continue flushing schedule per usual.  Keep upcoming 8 week appointment for catheter exchange if surgery is not performed in the interim.   Marliss Coots, MD Pager: [phone]

## 2020-11-15 LAB — URINE CULTURE

## 2020-12-04 ENCOUNTER — Other Ambulatory Visit (HOSPITAL_COMMUNITY): Payer: BLUE CROSS/BLUE SHIELD

## 2020-12-14 ENCOUNTER — Ambulatory Visit: Payer: Self-pay

## 2020-12-15 ENCOUNTER — Other Ambulatory Visit (HOSPITAL_COMMUNITY): Payer: BLUE CROSS/BLUE SHIELD

## 2021-02-22 ENCOUNTER — Ambulatory Visit: Payer: Self-pay

## 2021-03-01 ENCOUNTER — Ambulatory Visit
Admission: RE | Admit: 2021-03-01 | Discharge: 2021-03-01 | Disposition: A | Discharge status: Home / Self Care | Payer: BLUE CROSS/BLUE SHIELD | Source: Ambulatory Visit | Attending: Emergency Medicine | Admitting: Emergency Medicine

## 2021-03-01 ENCOUNTER — Other Ambulatory Visit: Payer: Self-pay

## 2021-03-01 VITALS — BP 173/102 | HR 98 | Temp 98.7°F | Resp 18

## 2021-03-01 DIAGNOSIS — L02213 Cutaneous abscess of chest wall: Secondary | ICD-10-CM | POA: Diagnosis present

## 2021-03-01 NOTE — ED Provider Notes (Signed)
HPI  SUBJECTIVE:  Glenn Murphy is a 45 y.o. male who presents with 2 weeks of a painful mass of gradually increasing size on his right chest.  States he was unable to sleep last night due to the constant, throbbing pain.  States that he gets these frequently and they usually require I&D.  He denies any spontaneous drainage.  No trauma to the area.  No known insect bite.  No nausea, running, fevers, body aches.  No antipyretic in the past 6 hours.  He tried warm compresses, multiple unknown prescription creams including topical clindamycin without improvement in his symptoms.  Symptoms are worse with palpation.  He has a past medical history of recurrent boils and is followed by 2 different dermatologists for this.  He has a history of diabetes, hypertension, DKA.  No history of MRSA.  PMD: [facility].   Past Medical History:  Diagnosis Date  . Abscess    recurrent infections  . Arthritis   . Diabetes mellitus without complication ([place])   . Hypertension   . Obesity     Past Surgical History:  Procedure Laterality Date  . CYST EXCISION N/A 01/28/2016   Procedure: EXCISION SEBACEOUS CYST FOREHEAD X 2;  Surgeon: Donnie Mesa, MD;  Location: [place];  Service: General;  Laterality: N/A;  . CYST REMOVAL TRUNK Left 01/28/2016   Procedure: EXCISION SEBACEOUS CYST LEFT SHOULDER AND MID-BACK;  Surgeon: Donnie Mesa, MD;  Location: [place];  Service: General;  Laterality: Left;  . HYDRADENITIS EXCISION Right 01/28/2016   Procedure: EXCISION HIDRADENITIS RIGHT AXILLA ;  Surgeon: Donnie Mesa, MD;  Location: [place];  Service: General;  Laterality: Right;  . HYDRADENITIS EXCISION N/A 01/28/2016   Procedure: EXCISION HIDRADENITIS POSTERIOR NECK ;  Surgeon: Donnie Mesa, MD;  Location: [place];  Service: General;  Laterality: N/A;  . IR CATHETER TUBE CHANGE  02/25/2020  . IR CATHETER TUBE CHANGE  06/02/2020  . IR CATHETER TUBE CHANGE  09/11/2020  . IR CATHETER TUBE CHANGE  10/21/2020  .  IR CYSTOGRAM  11/13/2020    Family History  Problem Relation Age of Onset  . Cancer Father     Social History   Tobacco Use  . Smoking status: Current Every Day Smoker    Packs/day: 0.50    Types: Cigarettes  . Smokeless tobacco: Never Used  Vaping Use  . Vaping Use: Never used  Substance Use Topics  . Alcohol use: Yes    Comment: 16oz beer a day  fifth of liquor over  q 3-4 days  . Drug use: Yes    Frequency: 2.0 times per week    Types: Marijuana    Comment: 01/26/16    No current facility-administered medications for this encounter.  Current Outpatient Medications:  .  allopurinol (ZYLOPRIM) 300 MG tablet, Take 300 mg by mouth daily., Disp: , Rfl: 3 .  amLODipine (NORVASC) 5 MG tablet, Take 1 tablet (5 mg total) by mouth daily., Disp: 30 tablet, Rfl: 0 .  blood glucose meter kit and supplies KIT, Dispense based on patient and insurance preference. Use up to four times daily as directed. (FOR ICD-9 250.00, 250.01)., Disp: 1 each, Rfl: 0 .  busPIRone (BUSPAR) 10 MG tablet, Take 10 mg by mouth 3 (three) times daily., Disp: , Rfl:  .  clindamycin (CLINDAGEL) 1 % gel, Apply 1 application topically See admin instructions. Apply to bumps on face and body 1-2 times daily as needed., Disp: , Rfl:  .  ferrous sulfate 325 (65 FE) MG tablet, Take 325 mg by mouth daily., Disp: , Rfl:  .  furosemide (LASIX) 20 MG tablet, Take 20 mg by mouth daily., Disp: , Rfl:  .  gabapentin (NEURONTIN) 600 MG tablet, Take 600 mg by mouth 3 (three) times daily., Disp: , Rfl:  .  ibuprofen (ADVIL) 800 MG tablet, Take 800 mg by mouth every 8 (eight) hours as needed., Disp: , Rfl:  .  Insulin Pen Needle (PEN NEEDLES 3/16") 31G X 5 MM MISC, USE WITH INSULIN PEN, Disp: 100 each, Rfl: 0 .  levofloxacin (LEVAQUIN) 500 MG tablet, Take 1 tablet (500 mg total) by mouth daily., Disp: 10 tablet, Rfl: 0 .  Magnesium 200 MG TABS, Take 200 mg by mouth daily., Disp: , Rfl:  .  metoprolol succinate (TOPROL-XL) 25 MG 24 hr  tablet, Take 1 tablet (25 mg total) by mouth daily., Disp: 90 tablet, Rfl: 0 .  Milk Thistle Extract 175 MG TABS, Take 175 mg by mouth daily., Disp: , Rfl:  .  Omega-3 1000 MG CAPS, Take 1 capsule by mouth daily., Disp: , Rfl:  .  sildenafil (REVATIO) 20 MG tablet, SMARTSIG:1-5 Tablet(s) By Mouth Daily PRN, Disp: , Rfl:  .  triamcinolone cream (KENALOG) 0.1 %, Apply 1 application topically 2 (two) times daily as needed. , Disp: , Rfl: 3 .  Vitamin D, Ergocalciferol, (DRISDOL) 1.25 MG (50000 UNIT) CAPS capsule, Take 50,000 Units by mouth once a week., Disp: , Rfl:   Allergies  Allergen Reactions  . Amoxicillin Nausea Only    Unknown reaction  . Other Other (See Comments)    Itchy throat-strawberries,watermelon     ROS  As noted in HPI.   Physical Exam  BP (!) 173/102 (BP Location: Left Arm)   Pulse 98   Temp 98.7 F (37.1 C) (Oral)   Resp 18   SpO2 94%   Constitutional: Well developed, well nourished, no acute distress Eyes:  EOMI, conjunctiva normal bilaterally HENT: Normocephalic, atraumatic,mucus membranes moist Respiratory: Normal inspiratory effort Cardiovascular: Normal rate GI: nondistended Skin: 9 x 5 cm tender area of erythema, induration with central fluctuance right chest.  Marked area of erythema with a marker for reference.     Musculoskeletal: no deformities Neurologic: Alert & oriented x 3, no focal neuro deficits Psychiatric: Speech and behavior appropriate   ED Course   Medications - No data to display  Orders Placed This Encounter  Procedures  . Aerobic Culture w Gram Stain (superficial specimen)    Standing Status:   Standing    Number of Occurrences:   1    No results found for this or any previous visit (from the past 24 hour(s)). No results found.  ED Clinical Impression  1. Abscess of chest wall      ED Assessment/Plan   Procedure note: Cleaned area with chlorhexidine and alcohol.  Used 2 cc of 1% lidocaine with epinephrine via  local infiltration with adequate anesthesia.  Made a single stab incision, expressing copious amount of odorous purulent and bloody drainage.  Explored wound to break up loculations.  Culture sent.  Then irrigated with 2 cc of lidocaine.  Placed dressing.  Patient tolerated procedure well.  Patient already on clindamycin p.o.  States he does not need any other antibiotics.  Follow-up with PMD as needed.  ER return precautions given.  Discussed  MDM, treatment plan, and plan for follow-up with patient. Discussed sn/sx that should prompt return to the ED. patient agrees  with plan.   No orders of the defined types were placed in this encounter.     *This clinic note was created using Dragon dictation software. Therefore, there may be occasional mistakes despite careful proofreading.  ?    Melynda Ripple, MD 03/01/21 1409

## 2021-03-01 NOTE — Discharge Instructions (Addendum)
Wound culture will be back in several days.  Continue warm compresses.  Soap and water.  Continue your oral clindamycin.  We will contact you if we need to change your antibiotics

## 2021-03-01 NOTE — ED Triage Notes (Signed)
Pt c/o abscess to rt upper chest x2wks. States hx of same and usually gets them drained.

## 2021-03-05 ENCOUNTER — Other Ambulatory Visit (HOSPITAL_COMMUNITY): Payer: Self-pay | Admitting: Family Medicine

## 2021-03-05 DIAGNOSIS — R600 Localized edema: Secondary | ICD-10-CM

## 2021-03-05 DIAGNOSIS — Z9189 Other specified personal risk factors, not elsewhere classified: Secondary | ICD-10-CM

## 2021-03-05 LAB — AEROBIC CULTURE W GRAM STAIN (SUPERFICIAL SPECIMEN)

## 2021-03-26 ENCOUNTER — Ambulatory Visit
Admission: RE | Admit: 2021-03-26 | Discharge: 2021-03-26 | Disposition: A | Discharge status: Home / Self Care | Payer: BLUE CROSS/BLUE SHIELD | Source: Ambulatory Visit | Attending: Student | Admitting: Student

## 2021-03-26 ENCOUNTER — Other Ambulatory Visit: Payer: Self-pay

## 2021-03-26 VITALS — BP 180/108 | HR 90 | Temp 98.1°F | Resp 18

## 2021-03-26 DIAGNOSIS — I10 Essential (primary) hypertension: Secondary | ICD-10-CM

## 2021-03-26 DIAGNOSIS — L732 Hidradenitis suppurativa: Secondary | ICD-10-CM

## 2021-03-26 DIAGNOSIS — E1169 Type 2 diabetes mellitus with other specified complication: Secondary | ICD-10-CM

## 2021-03-26 DIAGNOSIS — Z794 Long term (current) use of insulin: Secondary | ICD-10-CM

## 2021-03-26 DIAGNOSIS — L02213 Cutaneous abscess of chest wall: Secondary | ICD-10-CM | POA: Diagnosis not present

## 2021-03-26 MED ORDER — DOXYCYCLINE HYCLATE 100 MG PO CAPS: 100.0000 mg | ORAL_CAPSULE | Freq: Two times a day (BID) | ORAL | 0 refills | Status: AC

## 2021-03-26 NOTE — Discharge Instructions (Addendum)
-  Doxycycline twice daily for 7 days -Wash the area with gentle soap and water 1-2x daily. Replace bandage. -Seek additional medical attention if symptoms worsen despite treatment, like the abscess gets bigger, new fevers/chills.  -Please check your blood pressure at home or at the pharmacy. If this continues to be >140/90, follow-up with your primary care provider for further blood pressure management/ medication titration. If you develop chest pain, shortness of breath, vision changes, the worst headache of your life- head straight to the ED or call 911.

## 2021-03-26 NOTE — ED Triage Notes (Signed)
Pt c/o abscess to lt upper chest x1wk. States hx of same and usually gets them drained.

## 2021-03-26 NOTE — ED Provider Notes (Signed)
[name]    CSN: [id number] Arrival date & time: 03/26/21  1259      History   Chief Complaint Chief Complaint  Patient presents with   Abscess   appt 1    HPI Glenn Murphy is a 45 y.o. male presenting with abscess chest wall x1 week.  Medical history recurrent abscesses/hidradenitis, diabetes, hypertension, obesity.  States he was recently treated with prednisone and following this developed multiple abscesses.  Had one drained recently, this is healing well but now he has a new abscess on the left side of his chest wall.  Denies fever/chills.  States he is followed by skin specialist.  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   Acute urinary retention 01/24/2020   Rectal injury, initial encounter 01/24/2020   DKA (diabetic ketoacidoses) 01/15/2020   Abscess 05/26/2014    Past Surgical History:  Procedure Laterality Date   CYST EXCISION N/A 01/28/2016   Procedure: EXCISION SEBACEOUS CYST FOREHEAD X 2;  Surgeon: Donnie Mesa, MD;  Location: [place];  Service: General;  Laterality: N/A;   CYST REMOVAL TRUNK Left 01/28/2016   Procedure: EXCISION SEBACEOUS CYST LEFT SHOULDER AND MID-BACK;  Surgeon: Donnie Mesa, MD;  Location: [facility];  Service: General;  Laterality: Left;   HYDRADENITIS EXCISION Right 01/28/2016   Procedure: EXCISION HIDRADENITIS RIGHT AXILLA ;  Surgeon: Donnie Mesa, MD;  Location: [place];  Service: General;  Laterality: Right;   HYDRADENITIS EXCISION N/A 01/28/2016   Procedure: EXCISION HIDRADENITIS POSTERIOR NECK ;  Surgeon: Donnie Mesa, MD;  Location: [place];  Service: General;  Laterality: N/A;   IR CATHETER TUBE CHANGE   02/25/2020   IR CATHETER TUBE CHANGE  06/02/2020   IR CATHETER TUBE CHANGE  09/11/2020   IR CATHETER TUBE CHANGE  10/21/2020   IR CYSTOGRAM  11/13/2020       Home Medications    Prior to Admission medications   Medication Sig Start Date End Date Taking? Authorizing Provider  doxycycline (VIBRAMYCIN) 100 MG capsule Take 1 capsule (100 mg total) by mouth 2 (two) times daily for 7 days. 03/26/21 04/02/21 Yes Hazel Sams, PA-C  allopurinol (ZYLOPRIM) 300 MG tablet Take 300 mg by mouth daily. 11/16/15   [provider]  amLODipine (NORVASC) 5 MG tablet Take 1 tablet (5 mg total) by mouth daily. 09/10/20   Wieters, Hallie C, PA-C  blood glucose meter kit and supplies KIT Dispense based on patient and insurance preference. Use up to four times daily as directed. (FOR ICD-9 250.00, 250.01). 01/20/20   Antonieta Pert, MD  busPIRone (BUSPAR) 10 MG tablet Take 10 mg by mouth 3 (three) times daily. 09/10/19   [provider]  clindamycin (CLINDAGEL) 1 % gel Apply 1 application topically See admin instructions. Apply to bumps on face and body 1-2 times daily as needed. 12/06/19   [provider]  ferrous sulfate 325 (65 FE) MG tablet Take 325 mg by mouth daily.    [provider]  furosemide (LASIX) 20 MG tablet Take 20 mg by mouth daily. 02/11/20   [provider]  gabapentin (NEURONTIN) 600 MG tablet Take 600 mg  by mouth 3 (three) times daily. 12/31/19   [provider]  ibuprofen (ADVIL) 800 MG tablet Take 800 mg by mouth every 8 (eight) hours as needed.    [provider]  Insulin Pen Needle (PEN NEEDLES 3/16") 31G X 5 MM MISC USE WITH INSULIN PEN 01/20/20   Antonieta Pert, MD  levofloxacin (LEVAQUIN) 500 MG tablet Take 1 tablet (500 mg total) by mouth daily. 07/01/20   [name], MD  Magnesium 200 MG TABS Take 200 mg by mouth daily.    [provider]  metoprolol succinate (TOPROL-XL) 25 MG 24 hr tablet Take 1 tablet (25 mg total) by mouth  daily. 09/10/20   Wieters, Hallie C, PA-C  Milk Thistle Extract 175 MG TABS Take 175 mg by mouth daily.    [provider]  Omega-3 1000 MG CAPS Take 1 capsule by mouth daily.    [provider]  sildenafil (REVATIO) 20 MG tablet SMARTSIG:1-5 Tablet(s) By Mouth Daily PRN 05/18/20   [provider]  triamcinolone cream (KENALOG) 0.1 % Apply 1 application topically 2 (two) times daily as needed.  12/19/15   [provider]  Vitamin D, Ergocalciferol, (DRISDOL) 1.25 MG (50000 UNIT) CAPS capsule Take 50,000 Units by mouth once a week. 12/31/19   [provider]  insulin aspart (NOVOLOG FLEXPEN) 100 UNIT/ML FlexPen Inject 4 Units into the skin 3 (three) times daily with meals. 01/24/20 07/01/20  [name], MD  insulin glargine (LANTUS SOLOSTAR) 100 UNIT/ML Solostar Pen Inject 10 Units into the skin 2 (two) times daily. 01/24/20 04/23/20  [name], MD    Family History Family History  Problem Relation Age of Onset   Cancer Father     Social History Social History   Tobacco Use   Smoking status: Every Day    Packs/day: 0.50    Pack years: 0.00    Types: Cigarettes   Smokeless tobacco: Never  Vaping Use   Vaping Use: Never used  Substance Use Topics   Alcohol use: Yes    Comment: 16oz beer a day  fifth of liquor over  q 3-4 days   Drug use: Yes    Frequency: 2.0 times per week    Types: Marijuana    Comment: 01/26/16     Allergies   Amoxicillin and Other   Review of Systems Review of Systems  Skin:        abscess  All other systems reviewed and are negative.   Physical Exam Triage Vital Signs ED Triage Vitals  Enc Vitals Group     BP 03/26/21 1321 (!) 180/108     Pulse Rate 03/26/21 1321 90     Resp 03/26/21 1321 18     Temp 03/26/21 1321 98.1 F (36.7 C)     Temp Source 03/26/21 1321 Oral     SpO2 03/26/21 1321 98 %     Weight --      Height --      Head Circumference --      Peak Flow --      Pain Score 03/26/21  1322 6     Pain Loc --      Pain Edu? --      Excl. in [place]? --    No data found.  Updated Vital Signs BP (!) 180/108 (BP Location: Left Arm)   Pulse 90   Temp 98.1 F (36.7 C) (Oral)   Resp 18   SpO2 98%   Visual Acuity Right Eye  Distance:   Left Eye Distance:   Bilateral Distance:    Right Eye Near:   Left Eye Near:    Bilateral Near:     Physical Exam Vitals reviewed.  Constitutional:      General: He is not in acute distress.    Appearance: Normal appearance. He is not ill-appearing or diaphoretic.  HENT:     Head: Normocephalic and atraumatic.  Cardiovascular:     Rate and Rhythm: Normal rate and regular rhythm.     Heart sounds: Normal heart sounds.  Pulmonary:     Effort: Pulmonary effort is normal.     Breath sounds: Normal breath sounds.  Skin:    General: Skin is warm.     Comments: See image below L pectoral muscle with 2.5x2.5cm abscess with erythema, induration, and central fluctuance. Drained as below.   Neurological:     General: No focal deficit present.     Mental Status: He is alert and oriented to person, place, and time.  Psychiatric:        Mood and Affect: Mood normal.        Behavior: Behavior normal.        Thought Content: Thought content normal.        Judgment: Judgment normal.      UC Treatments / Results  Labs (all labs ordered are listed, but only abnormal results are displayed) Labs Reviewed - No data to display  EKG   Radiology No results found.  Procedures Incision and Drainage  Date/Time: 03/26/2021 1:43 PM Performed by: Hazel Sams, PA-C Authorized by: Hazel Sams, PA-C   Consent:    Consent obtained:  Verbal   Consent given by:  Patient   Risks, benefits, and alternatives were discussed: yes     Risks discussed:  Bleeding, incomplete drainage, infection and pain   Alternatives discussed:  No treatment Universal protocol:    Procedure explained and questions answered to patient or proxy's  satisfaction: yes     Patient identity confirmed:  Verbally with patient Location:    Type:  Abscess   Size:  2.5cm   Location:  Trunk   Trunk location:  Chest Pre-procedure details:    Skin preparation:  Povidone-iodine Sedation:    Sedation type:  None Anesthesia:    Anesthesia method:  Local infiltration and topical application   Local anesthetic:  Lidocaine 1% WITH epi Procedure details:    Incision types:  Stab incision   Drainage:  Purulent and bloody   Drainage amount:  Moderate   Wound treatment:  Wound left open   Packing materials:  None Post-procedure details:    Procedure completion:  Tolerated well, no immediate complications (including critical care time)  Medications Ordered in UC Medications - No data to display  Initial Impression / Assessment and Plan / UC Course  I have reviewed the triage vital signs and the nursing notes.  Pertinent labs & imaging results that were available during my care of the patient were reviewed by me and considered in my medical decision making (see chart for details).     This patient is a 45 year old male presenting with abscess. Pt with long history recurrent abscesses and hidradenitis. Drained as above.  For diabetes, sugars running 150s at home. Blood pressure is elevated today, he did take his antihypertensives about 2 hours ago.  Recheck this at home.  Doxycycline sent as below. Wound care provided and wound care instructions discussed.   ED return precautions discussed.  Patient verbalizes understanding and agreement.    Final Clinical Impressions(s) / UC Diagnoses   Final diagnoses:  Essential hypertension  Hidradenitis  Type 2 diabetes mellitus with other specified complication, with long-term current use of insulin (HCC)  Abscess of chest wall     Discharge Instructions      -Doxycycline twice daily for 7 days -Wash the area with gentle soap and water 1-2x daily. Replace bandage. -Seek additional medical  attention if symptoms worsen despite treatment, like the abscess gets bigger, new fevers/chills.  -Please check your blood pressure at home or at the pharmacy. If this continues to be >140/90, follow-up with your primary care provider for further blood pressure management/ medication titration. If you develop chest pain, shortness of breath, vision changes, the worst headache of your life- head straight to the ED or call 911.      ED Prescriptions     Medication Sig Dispense Auth. Provider   doxycycline (VIBRAMYCIN) 100 MG capsule Take 1 capsule (100 mg total) by mouth 2 (two) times daily for 7 days. 14 capsule Hazel Sams, PA-C      PDMP not reviewed this encounter.   Hazel Sams, PA-C 03/26/21 1401

## 2021-04-12 IMAGING — XA IR CATHETER TUBE CHANGE
1 series · 1 of 1 positions shown · non-contrast
Comparison: Previous fluoroscopic guided suprapubic catheter
exchanges, most recently 09/11/2020

INDICATION: History of urethral fistula with chronic suprapubic catheter
placement.

Patient presents today for fluoroscopic guided exchange secondary to
suprapubic catheter occlusion.
EXAM:
FLUOROSCOPIC GUIDED SUPRAPUBIC CATHETER EXCHANGE AND UP SIZING

[Series 300: tube placements · 1 of 1 slices shown]
[im 1/1]
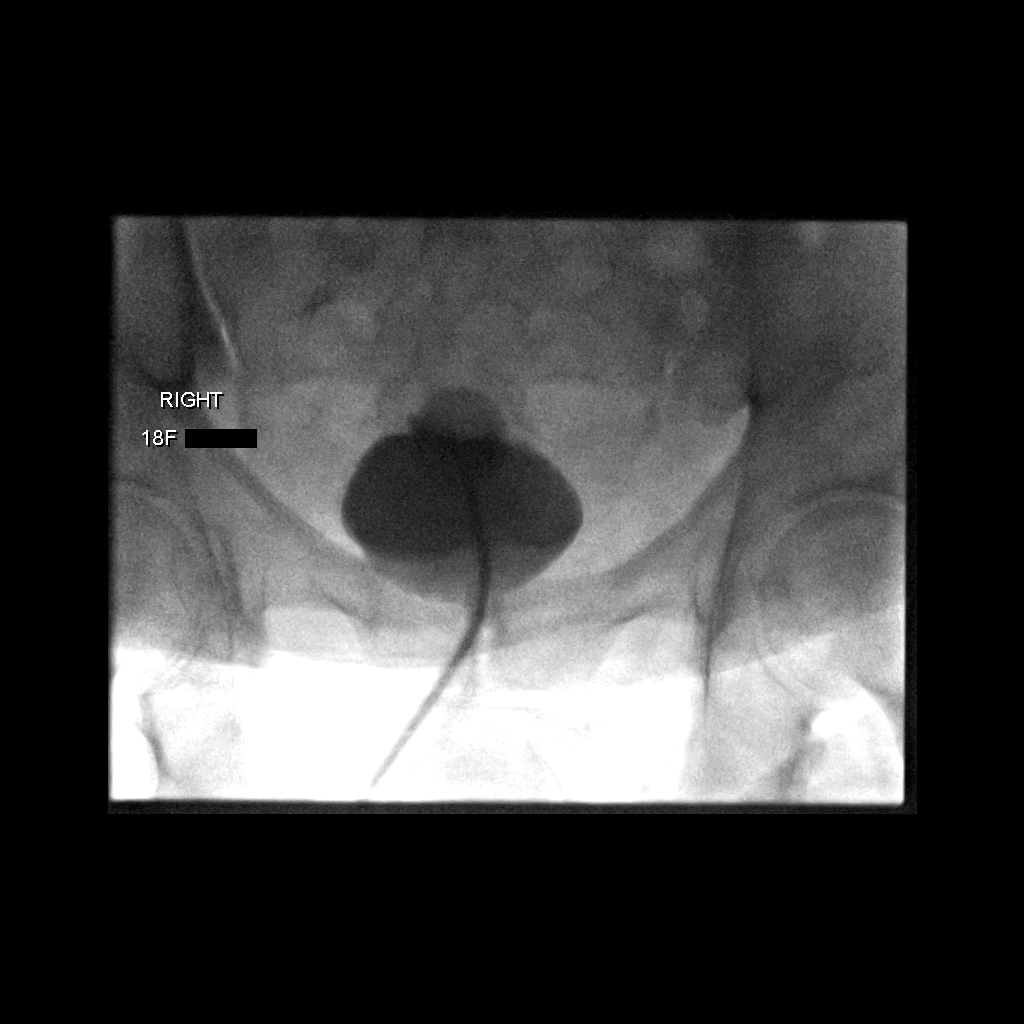

[1 of 1 positions shown; findings below may reference images not displayed]

MEDICATIONS:
None

ANESTHESIA/SEDATION:
None

CONTRAST:  None

FLUOROSCOPY TIME:  1 minutes, 24 seconds (109 mGy)

COMPLICATIONS:
None immediate.

PROCEDURE:
The procedure, risks, benefits, and alternatives were explained to
the patient. Questions regarding the procedure were encouraged and
answered. The patient understands and consents to the procedure. A
timeout was performed prior to the initiation of the procedure.

The external portion of the existing suprapubic catheter as well as
the surrounding skin were prepped and draped in usual sterile
fashion

A preprocedural spot fluoroscopic image was obtained of the lower
pelvis and existing 16 French balloon retention suprapubic catheter
with end overlying the expected location of the urinary bladder.

Contrast injection was attempted with only faint opacification of
the urinary bladder secondary to subtotal occlusion.

The external portion of the existing suprapubic catheter was cut and
attempted to be cannulated with a short Amplatz wire however the
wire could not be advanced through the entirety of the catheter
secondary to encrusted debris. Ultimately, a stiff glidewire was
advanced through the catheter and coiled within the urinary bladder

Under intermittent fluoroscopic guidance, the existing suprapubic
catheter was exchanged for a new, slightly larger now 18 Fr balloon
retention catheter with end terminating within the urinary bladder.

Appropriate positioning was confirmed with the injection of a small
amount of contrast as well as the efflux of urine.

The catheter was connected to a gravity bag. A dressing was placed.
The patient tolerated the procedure well without immediate
postprocedural complication.
FINDINGS: Preprocedural imaging demonstrates unchanged positioning of
suprapubic catheter with end overlying the expected location of the
urinary bladder.

Contrast injection confirms subtotal occlusion of the suprapubic
catheter.

After fluoroscopic guided exchange, a new, slightly larger now 18 Fr
balloon retention catheter is appropriately positioned within the
urinary bladder.
IMPRESSION: Technically successful fluoroscopic guided exchange and up sizing of
now 18 Fr balloon retention suprapubic catheter for the purposes of
chronic urinary bladder decompression.

PLAN:
Patient will return for routine fluoroscopic guided exchange in 8
weeks. Consideration for continual up sizing of the suprapubic
catheter could be performed as indicated.

## 2021-04-29 ENCOUNTER — Other Ambulatory Visit: Payer: Self-pay | Admitting: Nurse Practitioner

## 2021-04-29 DIAGNOSIS — N6320 Unspecified lump in the left breast, unspecified quadrant: Secondary | ICD-10-CM

## 2021-05-04 ENCOUNTER — Other Ambulatory Visit (HOSPITAL_COMMUNITY): Payer: Self-pay | Admitting: Urology

## 2021-05-04 DIAGNOSIS — Z435 Encounter for attention to cystostomy: Secondary | ICD-10-CM

## 2021-05-05 IMAGING — XA IR CYSTOGRAM UROLOGY S&I
2 series · 5 of 5 positions shown · non-contrast
Comparison: 10/21/2020

INDICATION: 44-year-old male with history of iatrogenic urethral injury, now
chronic rectourethral fistula status post suprapubic cystostomy tube
placement on 01/23/2020 with most recent exchange on 10/21/2020. Per
the patient, there has been no output from the tube for the past 5-6
days and he has noticed purulent drainage from the skin entry site.
No fevers, chills. The patient urinates voluntarily through his
penile urethra in addition to anal urine output. He states that he
has an upcoming definitive surgery planned at [facility]. He
flushes the tube daily without difficulty.

EXAM:
Suprapubic tube check.

[Series 1: care single · 1 of 1 slices shown]
[im 1/1]
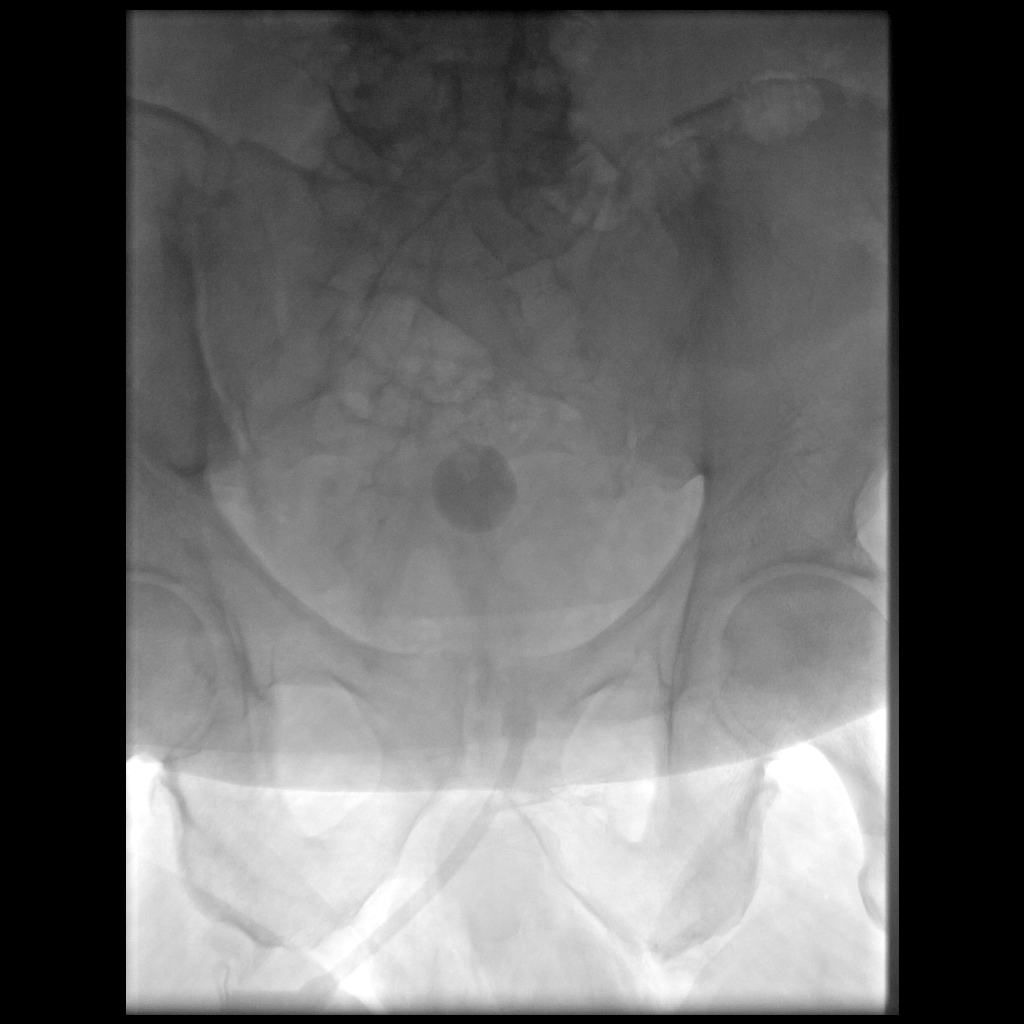

[Series 2: fl - angio · 4 of 69 frames shown]
[frame 1/69]
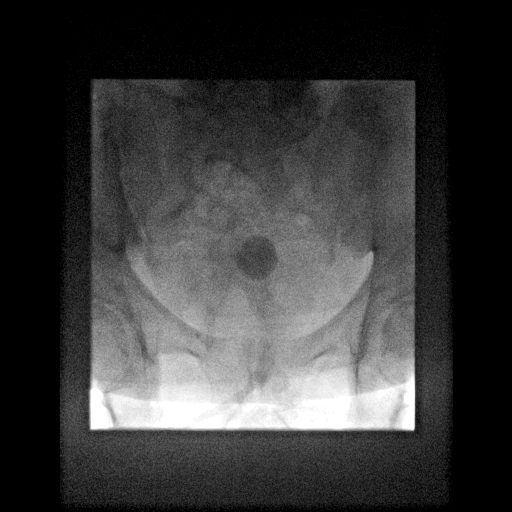
[frame 11/69]
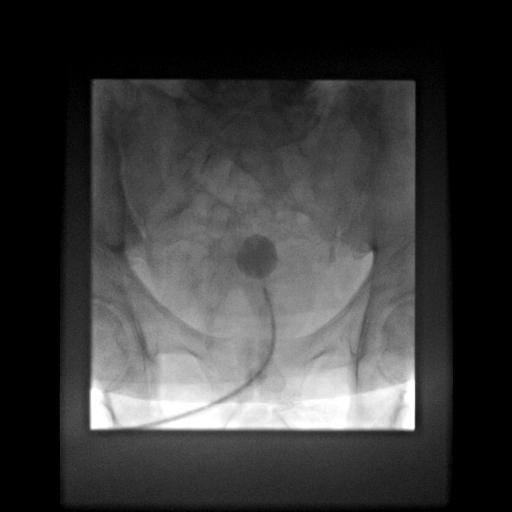
[frame 35/69]
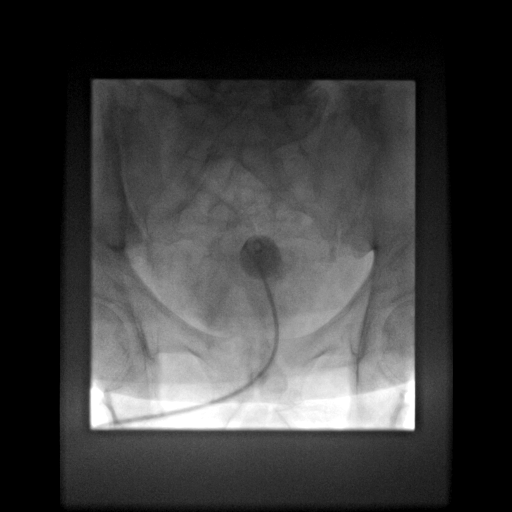
[frame 59/69]
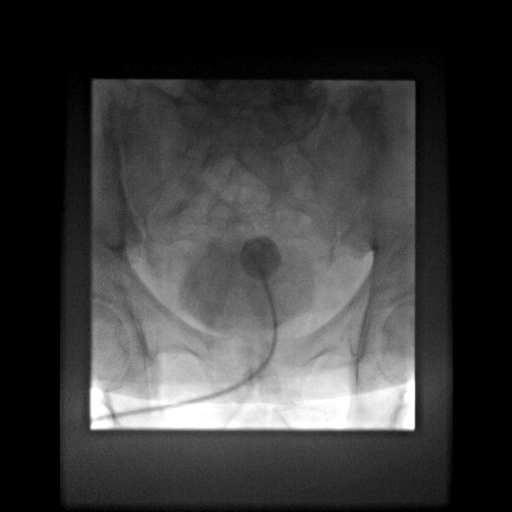

[5 of 5 positions shown; findings below may reference images not displayed]

MEDICATIONS:
None.

ANESTHESIA/SEDATION:
None.

CONTRAST:  50 mL Omnipaque 300-administered into the collecting
system(s)

FLUOROSCOPY TIME:  Fluoroscopy Time: 0 minutes 18 seconds (25 mGy).

COMPLICATIONS:
None immediate.

PROCEDURE:
Informed written consent was obtained from the patient after a
thorough discussion of the procedural risks, benefits and
alternatives. All questions were addressed. Maximal Sterile Barrier
Technique was utilized including caps, mask, sterile gowns, sterile
gloves, sterile drape, hand hygiene and skin antiseptic. A timeout
was performed prior to the initiation of the procedure.

Gentle hand injection of contrast demonstrates appropriate
positioning of the indwelling balloon retention cystostomy tube
within the urinary bladder.
IMPRESSION: Appropriate position of the indwelling suprapubic catheter. No
evidence of catheter malfunction or significant soft tissue
abnormality at the skin entry site.

PLAN:
Keep forthcoming appointment 8 weeks for tube exchange/possible
upsize pending interim definitive surgical intervention.

## 2021-05-07 ENCOUNTER — Other Ambulatory Visit (HOSPITAL_COMMUNITY): Payer: Self-pay | Admitting: Interventional Radiology

## 2021-05-07 ENCOUNTER — Other Ambulatory Visit: Payer: Self-pay

## 2021-05-07 ENCOUNTER — Ambulatory Visit (HOSPITAL_COMMUNITY)
Admission: RE | Admit: 2021-05-07 | Discharge: 2021-05-07 | Disposition: A | Discharge status: Home / Self Care | Payer: BLUE CROSS/BLUE SHIELD | Source: Ambulatory Visit | Attending: Urology | Admitting: Urology

## 2021-05-07 DIAGNOSIS — Z435 Encounter for attention to cystostomy: Secondary | ICD-10-CM | POA: Insufficient documentation

## 2021-05-07 HISTORY — PX: IR CATHETER TUBE CHANGE: IMG717

## 2021-05-07 MED ORDER — IOHEXOL 300 MG/ML  SOLN: 15.0000 mL | Freq: Once | INTRAMUSCULAR | Status: DC | PRN

## 2021-05-07 MED ORDER — LIDOCAINE VISCOUS HCL 2 % MT SOLN
OROMUCOSAL | Status: AC
  Administered 2021-05-07: 15 mL
  Filled 2021-05-07: qty 15

## 2021-05-07 MED ORDER — LIDOCAINE VISCOUS HCL 2 % MT SOLN
OROMUCOSAL | Status: AC | PRN
  Administered 2021-05-07: 15 mL via OROMUCOSAL

## 2021-05-07 NOTE — Procedures (Signed)
Pre procedural Dx: Urethral injury Post procedural Dx: Same  Successful fluoro guided exchange of 18 Fr balloon retention suprapubic catheter.  EBL: None Complications: None immediate  Katherina Right, MD Pager #: [phone]

## 2021-05-20 ENCOUNTER — Other Ambulatory Visit: Payer: Self-pay

## 2021-05-20 ENCOUNTER — Ambulatory Visit: Payer: BLUE CROSS/BLUE SHIELD

## 2021-05-22 ENCOUNTER — Other Ambulatory Visit: Payer: Self-pay

## 2021-05-22 ENCOUNTER — Ambulatory Visit
Admission: RE | Admit: 2021-05-22 | Discharge: 2021-05-22 | Disposition: A | Discharge status: Home / Self Care | Payer: BLUE CROSS/BLUE SHIELD | Source: Ambulatory Visit | Attending: Nurse Practitioner | Admitting: Nurse Practitioner

## 2021-05-22 DIAGNOSIS — L0201 Cutaneous abscess of face: Secondary | ICD-10-CM | POA: Diagnosis not present

## 2021-05-22 NOTE — Discharge Instructions (Addendum)
It is highly recommended that you go to the hospital for drainage of your abscess due to location on your face.  Please do not open up abscess at home on your own as this is not safe.  Please continue warm compresses as well.

## 2021-05-22 NOTE — ED Provider Notes (Signed)
[name]    CSN: [id number] Arrival date & time: 05/22/21  1058      History   Chief Complaint Chief Complaint  Patient presents with   Abscess    HPI Glenn Murphy is a 45 y.o. male.   Patient presents with abscess to right side of face that has been present for approximately 4 days.  States that he shaved his face and abscess appeared after. Denies any known fevers at home.  Patient does have history of hidradenitis with frequent flare ups of abscesses that appear throughout body.  Other pertinent history includes hypertension, diabetes, obesity.  States that he currently is taking doxycycline daily as prescribed by dermatology but is not sure of the dose.  Sees a dermatologist on a regular basis for management of hidradenitis.  States that he had a facial abscess prior that had to be drained at [facility] ED.  Patient has also been using warm compresses to affected area of face.  Abscess has not been draining on its own per patient.   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   Acute urinary retention 01/24/2020   Rectal injury, initial encounter 01/24/2020   DKA (diabetic ketoacidoses) 01/15/2020   Abscess 05/26/2014    Past Surgical History:  Procedure Laterality Date   CYST EXCISION N/A 01/28/2016   Procedure: EXCISION SEBACEOUS CYST FOREHEAD X 2;  Surgeon: Donnie Mesa, MD;  Location: [place];  Service: General;  Laterality: N/A;   CYST REMOVAL TRUNK Left 01/28/2016   Procedure: EXCISION SEBACEOUS CYST LEFT SHOULDER AND MID-BACK;  Surgeon: Donnie Mesa, MD;  Location: [place];  Service: General;  Laterality: Left;   HYDRADENITIS EXCISION  Right 01/28/2016   Procedure: EXCISION HIDRADENITIS RIGHT AXILLA ;  Surgeon: Donnie Mesa, MD;  Location: [place];  Service: General;  Laterality: Right;   HYDRADENITIS EXCISION N/A 01/28/2016   Procedure: EXCISION HIDRADENITIS POSTERIOR NECK ;  Surgeon: Donnie Mesa, MD;  Location: [place];  Service: General;  Laterality: N/A;   IR CATHETER TUBE CHANGE  02/25/2020   IR CATHETER TUBE CHANGE  06/02/2020   IR CATHETER TUBE CHANGE  09/11/2020   IR CATHETER TUBE CHANGE  10/21/2020   IR CATHETER TUBE CHANGE  05/07/2021   IR CYSTOGRAM  11/13/2020       Home Medications    Prior to Admission medications   Medication Sig Start Date End Date Taking? Authorizing Provider  allopurinol (ZYLOPRIM) 300 MG tablet Take 300 mg by mouth daily. 11/16/15   [provider]  amLODipine (NORVASC) 5 MG tablet Take 1 tablet (5 mg total) by mouth daily. 09/10/20   Wieters, Hallie C, PA-C  blood glucose meter kit and supplies KIT Dispense based on patient and insurance preference. Use up to four times daily as directed. (FOR ICD-9 250.00, 250.01). 01/20/20   Antonieta Pert, MD  busPIRone (BUSPAR) 10 MG tablet Take 10 mg by mouth 3 (three) times daily. 09/10/19   [provider]  clindamycin (CLINDAGEL) 1 % gel Apply 1 application topically See admin instructions. Apply to bumps on face and body 1-2 times daily as needed. 12/06/19   [provider]  ferrous sulfate 325 (65 FE) MG tablet Take 325 mg by mouth daily.    [provider]  furosemide (LASIX) 20 MG tablet Take 20 mg by mouth daily. 02/11/20   [provider]  gabapentin (NEURONTIN) 600 MG tablet Take 600 mg by mouth 3 (three) times daily. 12/31/19   [provider]  ibuprofen (ADVIL) 800 MG tablet Take 800 mg by mouth every 8 (eight) hours as needed.    [provider]  Insulin Pen Needle (PEN NEEDLES 3/16") 31G X 5 MM MISC USE WITH INSULIN PEN 01/20/20   Antonieta Pert, MD  levofloxacin (LEVAQUIN) 500 MG tablet Take 1  tablet (500 mg total) by mouth daily. 07/01/20   [name], MD  Magnesium 200 MG TABS Take 200 mg by mouth daily.    [provider]  metoprolol succinate (TOPROL-XL) 25 MG 24 hr tablet Take 1 tablet (25 mg total) by mouth daily. 09/10/20   Wieters, Hallie C, PA-C  Milk Thistle Extract 175 MG TABS Take 175 mg by mouth daily.    [provider]  Omega-3 1000 MG CAPS Take 1 capsule by mouth daily.    [provider]  sildenafil (REVATIO) 20 MG tablet SMARTSIG:1-5 Tablet(s) By Mouth Daily PRN 05/18/20   [provider]  triamcinolone cream (KENALOG) 0.1 % Apply 1 application topically 2 (two) times daily as needed.  12/19/15   [provider]  Vitamin D, Ergocalciferol, (DRISDOL) 1.25 MG (50000 UNIT) CAPS capsule Take 50,000 Units by mouth once a week. 12/31/19   [provider]  insulin aspart (NOVOLOG FLEXPEN) 100 UNIT/ML FlexPen Inject 4 Units into the skin 3 (three) times daily with meals. 01/24/20 07/01/20  [name], MD  insulin glargine (LANTUS SOLOSTAR) 100 UNIT/ML Solostar Pen Inject 10 Units into the skin 2 (two) times daily. 01/24/20 04/23/20  [name], MD    Family History Family History  Problem Relation Age of Onset   Cancer Father     Social History Social History   Tobacco Use   Smoking status: Every Day    Packs/day: 0.50    Types: Cigarettes   Smokeless tobacco: Never  Vaping Use   Vaping Use: Never used  Substance Use Topics   Alcohol use: Yes    Comment: 16oz beer a day  fifth of liquor over  q 3-4 days   Drug use: Yes    Frequency: 2.0 times per week    Types: Marijuana    Comment: 01/26/16     Allergies   Amoxicillin and Other   Review of Systems Review of Systems Per HPI  Physical Exam Triage Vital Signs ED Triage Vitals [05/22/21 1127]  Enc Vitals Group     BP      Pulse      Resp      Temp      Temp src      SpO2      Weight      Height      Head Circumference      Peak Flow       Pain Score 4     Pain Loc      Pain Edu?      Excl. in [place]?    No data found.  Updated Vital Signs There were no vitals taken for this visit.  Visual Acuity Right Eye Distance:   Left Eye Distance:   Bilateral Distance:    Right Eye Near:   Left Eye  Near:    Bilateral Near:     Physical Exam Constitutional:      Appearance: Normal appearance.  HENT:     Head: Normocephalic and atraumatic.  Eyes:     Extraocular Movements: Extraocular movements intact.     Conjunctiva/sclera: Conjunctivae normal.  Pulmonary:     Effort: Pulmonary effort is normal.  Skin:    Findings: Abscess present.     Comments: Approximately 5 cm diameter fluctuant abscess present to right face at angle of mandible that extends down onto tissue of neck.  No drainage noted on physical exam.  Neurological:     General: No focal deficit present.     Mental Status: He is alert and oriented to person, place, and time. Mental status is at baseline.  Psychiatric:        Mood and Affect: Mood normal.        Behavior: Behavior normal.        Thought Content: Thought content normal.        Judgment: Judgment normal.     UC Treatments / Results  Labs (all labs ordered are listed, but only abnormal results are displayed) Labs Reviewed - No data to display  EKG   Radiology No results found.  Procedures Procedures (including critical care time)  Medications Ordered in UC Medications - No data to display  Initial Impression / Assessment and Plan / UC Course  I have reviewed the triage vital signs and the nursing notes.  Pertinent labs & imaging results that were available during my care of the patient were reviewed by me and considered in my medical decision making (see chart for details).     Advised patient that it is not safe to perform I&D on abscess in urgent care due to location of abscess.  Advised patient that he needs to go to the hospital for drainage today.  Patient refused to go to  the hospital for further evaluation and management and was adamant about I&D being performed in urgent care.  Education provided to patient about safety of procedure in urgent care.  Suggested to patient antibiotic treatment as well as continuing warm compresses.  Patient also refused any further antibiotic treatment for abscess.  Then, patient stated that he would go home and "drain abscess himself".  Advised patient against draining abscess himself and the risk associated with this.  Patient voiced understanding of all risks and refused any further treatment at urgent care or evaluation at the hospital.  Patient also refused vital signs being completed.  Patient also left before AVS could be printed and supplied to patient. Final Clinical Impressions(s) / UC Diagnoses   Final diagnoses:  Facial abscess     Discharge Instructions      It is highly recommended that you go to the hospital for drainage of your abscess due to location on your face.  Please do not open up abscess at home on your own as this is not safe.  Please continue warm compresses as well.     ED Prescriptions   None    PDMP not reviewed this encounter.   Odis Luster, [place] 05/22/21 1218

## 2021-05-22 NOTE — ED Triage Notes (Signed)
Hx of abscess and pt said he has had a place on the right side of his face for 4 days that is swollen and sore.

## 2021-05-22 NOTE — ED Notes (Signed)
Pt refused vitals and eft without papers or vitals to be done

## 2021-06-04 ENCOUNTER — Other Ambulatory Visit (HOSPITAL_COMMUNITY): Payer: BLUE CROSS/BLUE SHIELD

## 2021-07-01 ENCOUNTER — Other Ambulatory Visit (HOSPITAL_COMMUNITY): Payer: Self-pay | Admitting: Urology

## 2021-07-01 DIAGNOSIS — Z9359 Other cystostomy status: Secondary | ICD-10-CM

## 2021-07-05 ENCOUNTER — Other Ambulatory Visit (HOSPITAL_COMMUNITY): Payer: BLUE CROSS/BLUE SHIELD

## 2021-07-06 ENCOUNTER — Other Ambulatory Visit (HOSPITAL_COMMUNITY): Payer: Self-pay | Admitting: Interventional Radiology

## 2021-07-06 ENCOUNTER — Ambulatory Visit (HOSPITAL_COMMUNITY)
Admission: RE | Admit: 2021-07-06 | Discharge: 2021-07-06 | Disposition: A | Discharge status: Home / Self Care | Payer: BLUE CROSS/BLUE SHIELD | Source: Ambulatory Visit | Attending: Urology | Admitting: Urology

## 2021-07-06 DIAGNOSIS — Z435 Encounter for attention to cystostomy: Secondary | ICD-10-CM | POA: Insufficient documentation

## 2021-07-06 DIAGNOSIS — Z9359 Other cystostomy status: Secondary | ICD-10-CM

## 2021-07-06 HISTORY — PX: IR CATHETER TUBE CHANGE: IMG717

## 2021-07-06 MED ORDER — LIDOCAINE VISCOUS HCL 2 % MT SOLN
OROMUCOSAL | Status: AC
  Filled 2021-07-06: qty 15

## 2021-07-06 MED ORDER — IOHEXOL 300 MG/ML  SOLN
15.0000 mL | Freq: Once | INTRAMUSCULAR | Status: AC | PRN
  Administered 2021-07-06: 10 mL

## 2021-07-06 NOTE — Procedures (Signed)
Interventional Radiology Procedure Note  Date of Procedure: 07/06/2021  Procedure: SPT exchange   Findings:  1. 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    Complications: No immediate complications noted.   Estimated Blood Loss: minimal  Follow-up and Recommendations: 1. Return for routine exchange    Olive Bass, MD  Vascular & Interventional Radiology  07/06/2021 10:00 AM

## 2021-08-31 ENCOUNTER — Other Ambulatory Visit (HOSPITAL_COMMUNITY): Payer: BLUE CROSS/BLUE SHIELD

## 2021-09-10 ENCOUNTER — Other Ambulatory Visit: Payer: Self-pay

## 2021-09-10 ENCOUNTER — Ambulatory Visit (HOSPITAL_COMMUNITY)
Admission: RE | Admit: 2021-09-10 | Discharge: 2021-09-10 | Disposition: A | Discharge status: Home / Self Care | Payer: BLUE CROSS/BLUE SHIELD | Source: Ambulatory Visit | Attending: Interventional Radiology | Admitting: Interventional Radiology

## 2021-09-10 DIAGNOSIS — Z9359 Other cystostomy status: Secondary | ICD-10-CM

## 2021-10-08 ENCOUNTER — Other Ambulatory Visit (HOSPITAL_COMMUNITY): Payer: BLUE CROSS/BLUE SHIELD

## 2021-10-08 ENCOUNTER — Encounter (HOSPITAL_COMMUNITY): Payer: Self-pay

## 2022-01-04 ENCOUNTER — Emergency Department (HOSPITAL_COMMUNITY)
Admission: EM | Admit: 2022-01-04 | Discharge: 2022-01-04 | Discharge status: Against Medical Advice | Payer: BLUE CROSS/BLUE SHIELD | Attending: Emergency Medicine | Admitting: Emergency Medicine

## 2022-01-04 ENCOUNTER — Other Ambulatory Visit: Payer: Self-pay

## 2022-01-04 ENCOUNTER — Emergency Department (HOSPITAL_COMMUNITY): Payer: BLUE CROSS/BLUE SHIELD

## 2022-01-04 ENCOUNTER — Encounter (HOSPITAL_COMMUNITY): Payer: Self-pay

## 2022-01-04 DIAGNOSIS — N492 Inflammatory disorders of scrotum: Secondary | ICD-10-CM | POA: Diagnosis present

## 2022-01-04 DIAGNOSIS — Z794 Long term (current) use of insulin: Secondary | ICD-10-CM | POA: Diagnosis not present

## 2022-01-04 DIAGNOSIS — N2889 Other specified disorders of kidney and ureter: Secondary | ICD-10-CM

## 2022-01-04 DIAGNOSIS — N360 Urethral fistula: Secondary | ICD-10-CM | POA: Insufficient documentation

## 2022-01-04 DIAGNOSIS — N5089 Other specified disorders of the male genital organs: Secondary | ICD-10-CM

## 2022-01-04 HISTORY — DX: Other specified disorders of the skin and subcutaneous tissue: L98.8

## 2022-01-04 LAB — CBC
HCT: 33.4 % — ABNORMAL LOW (ref 39.0–52.0)
Hemoglobin: 10.1 g/dL — ABNORMAL LOW (ref 13.0–17.0)
MCH: 26.6 pg (ref 26.0–34.0)
MCHC: 30.2 g/dL (ref 30.0–36.0)
MCV: 87.9 fL (ref 80.0–100.0)
Platelets: 381 10*3/uL (ref 150–400)
RBC: 3.8 MIL/uL — ABNORMAL LOW (ref 4.22–5.81)
RDW: 23.7 % — ABNORMAL HIGH (ref 11.5–15.5)
WBC: 16.1 10*3/uL — ABNORMAL HIGH (ref 4.0–10.5)
nRBC: 0.1 % (ref 0.0–0.2)

## 2022-01-04 LAB — COMPREHENSIVE METABOLIC PANEL
ALT: 19 U/L (ref 0–44)
AST: 28 U/L (ref 15–41)
Albumin: 3.7 g/dL (ref 3.5–5.0)
Alkaline Phosphatase: 97 U/L (ref 38–126)
Anion gap: 9 (ref 5–15)
BUN: 9 mg/dL (ref 6–20)
CO2: 22 mmol/L (ref 22–32)
Calcium: 8.9 mg/dL (ref 8.9–10.3)
Chloride: 106 mmol/L (ref 98–111)
Creatinine, Ser: 0.72 mg/dL (ref 0.61–1.24)
GFR, Estimated: >60 mL/min (ref >60)
Glucose, Bld: 122 mg/dL — ABNORMAL HIGH (ref 70–99)
Potassium: 3.9 mmol/L (ref 3.5–5.1)
Sodium: 137 mmol/L (ref 135–145)
Total Bilirubin: 1.2 mg/dL (ref 0.3–1.2)
Total Protein: 7.8 g/dL (ref 6.5–8.1)

## 2022-01-04 NOTE — ED Provider Notes (Signed)
?[name] ?Provider Note ? ? ?CSN: [id number] ?Arrival date & time: 01/04/22  1216 ? ?  ? ?History ? ?Chief Complaint  ?Patient presents with  ? Testicle Pain  ? Groin Swelling  ? ? ?Glenn Murphy is a 46 y.o. male. ? ?HPI ?46 yo male complaining of testicular swelling for 3 days bilaterally.  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  He reports similar symptoms in past and told infection.  Denies STI.  Yesterday low grade temp.  States he has stool in urine all the time.  No burning..  Urologist- Dr. Amalia Hailey.  [facility] ? ?  ? ?Home Medications ?Prior to Admission medications   ?Medication Sig Start Date End Date Taking? Authorizing Provider  ?allopurinol (ZYLOPRIM) 300 MG tablet Take 300 mg by mouth daily. 11/16/15   [provider]  ?amLODipine (NORVASC) 5 MG tablet Take 1 tablet (5 mg total) by mouth daily. 09/10/20   Wieters, Hallie C, PA-C  ?blood glucose meter kit and supplies KIT Dispense based on patient and insurance preference. Use up to four times daily as directed. (FOR ICD-9 250.00, 250.01). 01/20/20   Antonieta Pert, MD  ?busPIRone (BUSPAR) 10 MG tablet Take 10 mg by mouth 3 (three) times daily. 09/10/19   [provider]  ?clindamycin (CLINDAGEL) 1 % gel Apply 1 application topically See admin instructions. Apply to bumps on face and body 1-2 times daily as needed. 12/06/19   [provider]  ?ferrous sulfate 325 (65 FE) MG tablet Take 325 mg by mouth daily.    [provider]  ?furosemide (LASIX) 20 MG tablet Take 20 mg by mouth daily. 02/11/20   [provider]  ?gabapentin (NEURONTIN) 600 MG tablet Take 600 mg by mouth 3 (three) times daily. 12/31/19   [provider]  ?ibuprofen (ADVIL) 800 MG tablet Take 800 mg by mouth every 8 (eight) hours as needed.    [provider]  ?Insulin Pen Needle (PEN NEEDLES 3/16") 31G X 5 MM MISC USE WITH INSULIN PEN 01/20/20   Antonieta Pert, MD  ?levofloxacin (LEVAQUIN) 500 MG tablet  Take 1 tablet (500 mg total) by mouth daily. 07/01/20   [name], MD  ?Magnesium 200 MG TABS Take 200 mg by mouth daily.    [provider]  ?metoprolol succinate (TOPROL-XL) 25 MG 24 hr tablet Take 1 tablet (25 mg total) by mouth daily. 09/10/20   Wieters, Elesa Hacker, PA-C  ?Milk Thistle Extract 175 MG TABS Take 175 mg by mouth daily.    [provider]  ?Omega-3 1000 MG CAPS Take 1 capsule by mouth daily.    [provider]  ?sildenafil (REVATIO) 20 MG tablet SMARTSIG:1-5 Tablet(s) By Mouth Daily PRN 05/18/20   [provider]  ?triamcinolone cream (KENALOG) 0.1 % Apply 1 application topically 2 (two) times daily as needed.  12/19/15   [provider]  ?Vitamin D, Ergocalciferol, (DRISDOL) 1.25 MG (50000 UNIT) CAPS capsule Take 50,000 Units by mouth once a week. 12/31/19   [provider]  ?insulin aspart (NOVOLOG FLEXPEN) 100 UNIT/ML FlexPen Inject 4 Units into the skin 3 (three) times daily with meals. 01/24/20 07/01/20  [name], MD  ?insulin glargine (LANTUS SOLOSTAR) 100 UNIT/ML Solostar Pen Inject 10 Units into the skin 2 (two) times daily. 01/24/20 04/23/20  [name], MD  ?   ? ?Allergies    ?Amoxicillin and Other   ? ?Review of Systems   ?Review of Systems  ?Constitutional:  Positive  for fever. Negative for activity change, appetite change and chills.  ?HENT: Negative.    ?Eyes: Negative.   ?Respiratory: Negative.    ?Cardiovascular: Negative.   ?Gastrointestinal: Negative.   ?Endocrine: Negative.   ?Genitourinary:  Positive for scrotal swelling. Negative for dysuria.  ?Musculoskeletal: Negative.   ?Skin: Negative.   ?Neurological: Negative.   ?Psychiatric/Behavioral: Negative.    ?All other systems reviewed and are negative. ? ?Physical Exam ?Updated Vital Signs ?BP (!) 148/91   Pulse 95   Temp 98.2 ?F (36.8 ?C) (Oral)   Resp 18   Ht 1.753 m ($Remove'5\' 9"'WLQmMyv$ )   Wt 133.8 kg   SpO2 94%   BMI 43.56 kg/m?  ?Physical Exam ?Vitals reviewed.   ?Constitutional:   ?   Appearance: Normal appearance.  ?HENT:  ?   Head: Normocephalic.  ?   Right Ear: External ear normal.  ?   Left Ear: External ear normal.  ?   Nose: Nose normal.  ?   Mouth/Throat:  ?   Pharynx: Oropharynx is clear.  ?Eyes:  ?   Pupils: Pupils are equal, round, and reactive to light.  ?Cardiovascular:  ?   Rate and Rhythm: Normal rate and regular rhythm.  ?   Pulses: Normal pulses.  ?Pulmonary:  ?   Effort: Pulmonary effort is normal.  ?Abdominal:  ?   General: Bowel sounds are normal.  ?Genitourinary: ?   Comments: Bilateral testicular swelling ?Musculoskeletal:     ?   General: Normal range of motion.  ?   Cervical back: Normal range of motion.  ?Skin: ?   General: Skin is warm and dry.  ?   Capillary Refill: Capillary refill takes less than 2 seconds.  ?Neurological:  ?   General: No focal deficit present.  ?   Mental Status: He is alert.  ? ? ?ED Results / Procedures / Treatments   ?Labs ?(all labs ordered are listed, but only abnormal results are displayed) ?Labs Reviewed  ?COMPREHENSIVE METABOLIC PANEL - Abnormal; Notable for the following components:  ?    Result Value  ? Glucose, Bld 122 (*)   ? All other components within normal limits  ?CBC - Abnormal; Notable for the following components:  ? WBC 16.1 (*)   ? RBC 3.80 (*)   ? Hemoglobin 10.1 (*)   ? HCT 33.4 (*)   ? RDW 23.7 (*)   ? All other components within normal limits  ?CULTURE, BLOOD (ROUTINE X 2)  ?CULTURE, BLOOD (ROUTINE X 2)  ?URINALYSIS, ROUTINE W REFLEX MICROSCOPIC  ? ? ?EKG ?None ? ?Radiology ?US SCROTUM W/DOPPLER ? ?Result Date: 01/04/2022 ?CLINICAL DATA:  Testicular swelling. EXAM: SCROTAL ULTRASOUND DOPPLER ULTRASOUND OF THE TESTICLES TECHNIQUE: Complete ultrasound examination of the testicles, epididymis, and other scrotal structures was performed. Color and spectral Doppler ultrasound were also utilized to evaluate blood flow to the testicles. COMPARISON:  July 02, 2021.  April 23, 2020. FINDINGS: Right testicle  Measurements: 4.3 x 3.3 x 2.6 cm. No mass or microlithiasis visualized. Hypervascular compared to left testicle. Left testicle Measurements: 4.0 x 3.1 x 2.0 cm. No mass or microlithiasis visualized. Right epididymis:  Normal in size and appearance. Left epididymis:  Normal in size and appearance. Hydrocele:  Mild bilateral hydroceles are noted. Varicocele:  Right-sided varicocele is noted. Pulsed Doppler interrogation of both testes demonstrates normal low resistance arterial and venous waveforms bilaterally. IMPRESSION: No evidence of testicular mass or torsion. However, the right testicle is hypervascular suggesting orchitis. Small bilateral hydroceles are  noted. Right-sided varicocele is noted. Electronically Signed   By: Marijo Conception M.D.   On: 01/04/2022 14:15   ? ?Procedures ?Procedures  ? ? ?Medications Ordered in ED ?Medications - No data to display ? ?ED Course/ Medical Decision Making/ A&P ?Clinical Course as of 01/04/22 1606  ?Tue Jan 04, 2022  ?1555 CBC reviewed interpreted with leukocytosis at 16,100 ?Anemia at 10 which is decreased from first prior of 13 [DR]  ?1555 Comprehensive metabolic panel(!) ?Complete metabolic panel reviewed interpreted with mild hyperglycemia 122 noted otherwise within normal limits [DR]  ?1555 Scrotal ultrasound reviewed and interpreted with no evidence of testicular mass or torsion but with some hypervascularity on the right suggesting orchitis with small bilateral hydroceles noted [DR]  ?[id number] Labs reviewed and urology paged ?Nursing informs me that patient left AGAINST MEDICAL ADVICE. [DR]  ?  ?Clinical Course User Index ?[DR] Pattricia Boss, MD  ? ?                        ?Medical Decision Making ?Amount and/or Complexity of Data Reviewed ?Labs:  Decision-making details documented in ED Course. ? ? ? ? ? ? ? ? ? ? ? ?Final Clinical Impression(s) / ED Diagnoses ?Final diagnoses:  ?Rectoureteral fistula  ?Testicular swelling  ? ? ?Rx / DC Orders ?ED Discharge Orders   ? ?  None  ? ?  ? ? ?  ?Pattricia Boss, MD ?01/04/22 1606 ? ?

## 2022-01-04 NOTE — ED Triage Notes (Signed)
Patient c/o right testicle swelling yesterday and today both sides. ?Patient reports that he has a fistula. Patient states he is not able to urinate normally due to the fistula. ?

## 2022-01-04 NOTE — ED Notes (Signed)
Pt states he needs to leave - MD aware ?

## 2022-01-04 NOTE — ED Provider Triage Note (Cosign Needed)
Emergency Medicine Provider Triage Evaluation Note ? ?Glenn Murphy , a 46 y.o. male  was evaluated in triage.  Pt complains of testicular swelling and discomfort.  Patient states that about 3 days ago he noted some right testicular swelling and pain.  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  States that yesterday he had bilateral swelling and increased discomfort. He denies any dysuria, but states due to his fistula he has stool in his urine every time he urinates.  ? ?Review of Systems  ?Positive: Testicular swelling and pain ?Negative: Abdominal pain, nausea or vomiting ? ?Physical Exam  ?BP 134/82 (BP Location: Left Arm)   Pulse (!) 108   Temp 98.2 ?F (36.8 ?C) (Oral)   Resp 18   Ht 5\' 9"  (1.753 m)   Wt 133.8 kg   SpO2 98%   BMI 43.56 kg/m?  ?Gen:   Awake, no distress   ?Resp:  Normal effort  ?MSK:   Moves extremities without difficulty  ?Other:   ? ?Medical Decision Making  ?Medically screening exam initiated at 12:57 PM.  Appropriate orders placed.  Glenn Murphy was informed that the remainder of the evaluation will be completed by another provider, this initial triage assessment does not replace that evaluation, and the importance of remaining in the ED until their evaluation is complete. ? ? ?  ?Osman Calzadilla T, PA-C ?01/04/22 1259 ? ?

## 2022-01-09 LAB — CULTURE, BLOOD (ROUTINE X 2)
Culture: NO GROWTH
Culture: NO GROWTH
Special Requests: ADEQUATE
Special Requests: ADEQUATE

## 2022-04-20 ENCOUNTER — Emergency Department (HOSPITAL_COMMUNITY)
Admission: EM | Admit: 2022-04-20 | Discharge: 2022-04-20 | Discharge status: Against Medical Advice | Payer: BLUE CROSS/BLUE SHIELD | Attending: Emergency Medicine | Admitting: Emergency Medicine

## 2022-04-20 ENCOUNTER — Encounter (HOSPITAL_COMMUNITY): Payer: Self-pay

## 2022-04-20 ENCOUNTER — Other Ambulatory Visit (HOSPITAL_COMMUNITY): Payer: BLUE CROSS/BLUE SHIELD

## 2022-04-20 ENCOUNTER — Emergency Department (HOSPITAL_COMMUNITY): Payer: BLUE CROSS/BLUE SHIELD

## 2022-04-20 DIAGNOSIS — M546 Pain in thoracic spine: Secondary | ICD-10-CM | POA: Insufficient documentation

## 2022-04-20 DIAGNOSIS — Z5321 Procedure and treatment not carried out due to patient leaving prior to being seen by health care provider: Secondary | ICD-10-CM | POA: Diagnosis not present

## 2022-04-20 MED ORDER — DICLOFENAC EPOLAMINE 1.3 % EX PTCH
1.0000 | MEDICATED_PATCH | Freq: Two times a day (BID) | CUTANEOUS | Status: DC
Start: 2022-04-20 — End: 2022-04-20

## 2022-04-20 NOTE — ED Triage Notes (Signed)
Pt arrived via POV, c/o right rib pain. States mechanical fall several days ago. Started with some back and right rib pain. Back pain resolved, but rib pain continued.

## 2022-04-20 NOTE — ED Provider Notes (Signed)
  [facility]-EMERGENCY DEPT Provider Note   CSN: [id number] Arrival date & time: 04/20/22  9458   Screening Exam  Adult male with multiple medical issues including upcoming surgery for repair of fistula now presents with right-sided thoracic pain.  Patient is awake, alert, speaking clearly, but notes that since the fall he has had pain in the right side.  X-ray ordered, patient will be seen following results.  Exam: General: No distress Respiratory: No increased work of breathing   Update: Patient left prior to completing evaluation.   Gerhard Munch, MD 04/22/22 1137

## 2022-08-19 ENCOUNTER — Other Ambulatory Visit: Payer: Self-pay

## 2022-08-19 ENCOUNTER — Encounter (HOSPITAL_COMMUNITY): Payer: Self-pay

## 2022-08-19 ENCOUNTER — Emergency Department (HOSPITAL_COMMUNITY)
Admission: EM | Admit: 2022-08-19 | Discharge: 2022-08-19 | Disposition: A | Discharge status: Home / Self Care | Payer: BLUE CROSS/BLUE SHIELD | Attending: Emergency Medicine | Admitting: Emergency Medicine

## 2022-08-19 DIAGNOSIS — L0201 Cutaneous abscess of face: Secondary | ICD-10-CM | POA: Diagnosis present

## 2022-08-19 DIAGNOSIS — L0291 Cutaneous abscess, unspecified: Secondary | ICD-10-CM

## 2022-08-19 MED ORDER — LIDOCAINE-EPINEPHRINE-TETRACAINE (LET) TOPICAL GEL
3.0000 mL | Freq: Once | TOPICAL | Status: DC
  Filled 2022-08-19: qty 3

## 2022-08-19 MED ORDER — OXYCODONE-ACETAMINOPHEN 5-325 MG PO TABS
1.0000 | ORAL_TABLET | Freq: Once | ORAL | Status: AC
  Administered 2022-08-19: 1 via ORAL
  Filled 2022-08-19: qty 1

## 2022-08-19 MED ORDER — CLINDAMYCIN HCL 300 MG PO CAPS
300.0000 mg | ORAL_CAPSULE | Freq: Once | ORAL | Status: AC
  Administered 2022-08-19: 300 mg via ORAL
  Filled 2022-08-19: qty 1

## 2022-08-19 MED ORDER — LIDOCAINE HCL (PF) 1 % IJ SOLN
10.0000 mL | Freq: Once | INTRAMUSCULAR | Status: AC
  Administered 2022-08-19: 10 mL via INTRADERMAL
  Filled 2022-08-19: qty 30

## 2022-08-19 MED ORDER — CLINDAMYCIN HCL 300 MG PO CAPS: 300.0000 mg | ORAL_CAPSULE | Freq: Three times a day (TID) | ORAL | 0 refills | Status: AC

## 2022-08-19 NOTE — ED Provider Notes (Signed)
[facility] DEPT Provider Note   CSN: [id number] Arrival date & time: 08/19/22  1027     History  Chief Complaint  Patient presents with   Abscess    Glenn Murphy is a 46 y.o. male w/ hidradenitis presenting to ED with multiple facial abscesses, ongoing for 2-3 days, mild drainage.  He is on Doxycycline daily as ppx and clindamycin gel.  Follows with dermatology and ID for his hidradenitis, and he has had surgical debridement and deroofing of infected cysts in his back.  HPI     Home Medications Prior to Admission medications   Medication Sig Start Date End Date Taking? Authorizing Provider  clindamycin (CLEOCIN) 300 MG capsule Take 1 capsule (300 mg total) by mouth 3 (three) times daily for 7 days. 08/19/22 08/26/22 Yes Trifan, Carola Rhine, MD  allopurinol (ZYLOPRIM) 300 MG tablet Take 300 mg by mouth daily. 11/16/15   [provider]  amLODipine (NORVASC) 5 MG tablet Take 1 tablet (5 mg total) by mouth daily. 09/10/20   Wieters, Hallie C, PA-C  blood glucose meter kit and supplies KIT Dispense based on patient and insurance preference. Use up to four times daily as directed. (FOR ICD-9 250.00, 250.01). 01/20/20   Antonieta Pert, MD  busPIRone (BUSPAR) 10 MG tablet Take 10 mg by mouth 3 (three) times daily. 09/10/19   [provider]  clindamycin (CLINDAGEL) 1 % gel Apply 1 application topically See admin instructions. Apply to bumps on face and body 1-2 times daily as needed. 12/06/19   [provider]  ferrous sulfate 325 (65 FE) MG tablet Take 325 mg by mouth daily.    [provider]  furosemide (LASIX) 20 MG tablet Take 20 mg by mouth daily. 02/11/20   [provider]  gabapentin (NEURONTIN) 600 MG tablet Take 600 mg by mouth 3 (three) times daily. 12/31/19   [provider]  ibuprofen (ADVIL) 800 MG tablet Take 800 mg by mouth every 8 (eight) hours as needed.    [provider]  Insulin Pen  Needle (PEN NEEDLES 3/16") 31G X 5 MM MISC USE WITH INSULIN PEN 01/20/20   Antonieta Pert, MD  levofloxacin (LEVAQUIN) 500 MG tablet Take 1 tablet (500 mg total) by mouth daily. 07/01/20   [name], MD  Magnesium 200 MG TABS Take 200 mg by mouth daily.    [provider]  metoprolol succinate (TOPROL-XL) 25 MG 24 hr tablet Take 1 tablet (25 mg total) by mouth daily. 09/10/20   Wieters, Hallie C, PA-C  Milk Thistle Extract 175 MG TABS Take 175 mg by mouth daily.    [provider]  Omega-3 1000 MG CAPS Take 1 capsule by mouth daily.    [provider]  sildenafil (REVATIO) 20 MG tablet SMARTSIG:1-5 Tablet(s) By Mouth Daily PRN 05/18/20   [provider]  triamcinolone cream (KENALOG) 0.1 % Apply 1 application topically 2 (two) times daily as needed.  12/19/15   [provider]  Vitamin D, Ergocalciferol, (DRISDOL) 1.25 MG (50000 UNIT) CAPS capsule Take 50,000 Units by mouth once a week. 12/31/19   [provider]  insulin aspart (NOVOLOG FLEXPEN) 100 UNIT/ML FlexPen Inject 4 Units into the skin 3 (three) times daily with meals. 01/24/20 07/01/20  [name], MD  insulin glargine (LANTUS SOLOSTAR) 100 UNIT/ML Solostar Pen Inject 10 Units into the skin 2 (two) times daily. 01/24/20 04/23/20  [name], MD      Allergies    Amoxicillin  and Other    Review of Systems   Review of Systems  Physical Exam Updated Vital Signs BP (!) 178/66 (BP Location: Right Arm)   Pulse 98   Temp 98 F (36.7 C) (Oral)   Resp 18   Ht 5' 10" (1.778 m)   Wt 135.6 kg   SpO2 99%   BMI 42.90 kg/m  Physical Exam Constitutional:      General: He is not in acute distress. HENT:     Head: Normocephalic and atraumatic.  Eyes:     Conjunctiva/sclera: Conjunctivae normal.     Pupils: Pupils are equal, round, and reactive to light.  Cardiovascular:     Rate and Rhythm: Normal rate and regular rhythm.  Pulmonary:     Effort: Pulmonary effort is normal. No  respiratory distress.  Skin:    General: Skin is warm and dry.     Comments: 2 cystic structures with surrounding fluctuance noted near the right maxilla below the right eye, some surrounding erythema.  Small firm cystic structure noted to the right of the mouth  Neurological:     General: No focal deficit present.     Mental Status: He is alert. Mental status is at baseline.  Psychiatric:        Mood and Affect: Mood normal.        Behavior: Behavior normal.     ED Results / Procedures / Treatments   Labs (all labs ordered are listed, but only abnormal results are displayed) Labs Reviewed - No data to display  EKG None  Radiology No results found.  Procedures .Marland KitchenIncision and Drainage  Date/Time: 08/19/2022 1:11 PM  Performed by: Wyvonnia Dusky, MD Authorized by: Wyvonnia Dusky, MD   Consent:    Consent obtained:  Verbal   Consent given by:  Patient and parent   Risks discussed:  Incomplete drainage, infection, bleeding and pain   Alternatives discussed:  No treatment Universal protocol:    Procedure explained and questions answered to patient or proxy's satisfaction: yes     Site/side marked: yes     Immediately prior to procedure, a time out was called: yes     Patient identity confirmed:  Arm band Location:    Type:  Abscess   Size:  2   Location:  Head   Head location:  Face Pre-procedure details:    Skin preparation:  Povidone-iodine Sedation:    Sedation type:  None Anesthesia:    Anesthesia method:  Local infiltration   Local anesthetic:  Lidocaine 1% w/o epi Procedure type:    Complexity:  Complex Procedure details:    Ultrasound guidance: yes     Needle aspiration: no     Incision types:  Stab incision   Wound management:  Probed and deloculated   Drainage:  Purulent and bloody   Drainage amount:  Moderate   Wound treatment:  Wound left open   Packing materials:  None Post-procedure details:    Procedure completion:  Tolerated well, no  immediate complications Comments:     Additional small incision made to upper maxilla near site of smaller cystic structure, and right lower outer edge of mouth, where a cystic structure was deroofed with attempted marsupialization     Medications Ordered in ED Medications  lidocaine (PF) (XYLOCAINE) 1 % injection 10 mL (has no administration in time range)  oxyCODONE-acetaminophen (PERCOCET/ROXICET) 5-325 MG per tablet 1 tablet (1 tablet Oral Given 08/19/22 1156)  clindamycin (CLEOCIN) capsule 300 mg (300 mg  Oral Given 08/19/22 1156)    ED Course/ Medical Decision Making/ A&P                           Medical Decision Making Risk Prescription drug management.   Patient is here with multiple potential abscesses versus infected cyst.  Per ultrasound exam I suspect they are both.  I was able to perform bedside I&D at 3 of the sites on the right side of his face, including the right maxilla, which had the most amount of purulent drainage.  I do expect that there may be a cystic structure within this, which may be causing the underlying infection.  Unfortunately I suspect he will ultimately need surgical deroofing and removal of the cysts, which, given facial involvement in multiple sites, is more extensive than I think we can safely achieve at the bedside in the ED.    I will start him on oral clindamycin x 1 week as well given cellulitic skin changes below the eye - possible preseptal cellulitis.          Final Clinical Impression(s) / ED Diagnoses Final diagnoses:  Abscess    Rx / DC Orders ED Discharge Orders          Ordered    clindamycin (CLEOCIN) 300 MG capsule  3 times daily        08/19/22 1314              Trifan, Carola Rhine, MD 08/19/22 1314

## 2022-08-19 NOTE — ED Triage Notes (Signed)
Right cheek abscess draining x 2 days. Pt states took Ibuprofen and Tylenol this morning. Hs hidradenitis

## 2023-10-27 ENCOUNTER — Emergency Department (HOSPITAL_COMMUNITY)
Admission: EM | Admit: 2023-10-27 | Discharge: 2023-10-28 | Discharge status: Against Medical Advice | Payer: No Typology Code available for payment source | Attending: Emergency Medicine | Admitting: Emergency Medicine

## 2023-10-27 ENCOUNTER — Other Ambulatory Visit: Payer: Self-pay

## 2023-10-27 ENCOUNTER — Emergency Department (HOSPITAL_COMMUNITY): Payer: No Typology Code available for payment source

## 2023-10-27 ENCOUNTER — Encounter (HOSPITAL_COMMUNITY): Payer: Self-pay | Admitting: Emergency Medicine

## 2023-10-27 DIAGNOSIS — Z5321 Procedure and treatment not carried out due to patient leaving prior to being seen by health care provider: Secondary | ICD-10-CM | POA: Diagnosis not present

## 2023-10-27 DIAGNOSIS — S199XXA Unspecified injury of neck, initial encounter: Secondary | ICD-10-CM | POA: Diagnosis present

## 2023-10-27 DIAGNOSIS — S129XXA Fracture of neck, unspecified, initial encounter: Secondary | ICD-10-CM | POA: Diagnosis not present

## 2023-10-27 MED ORDER — HYDROCODONE-ACETAMINOPHEN 5-325 MG PO TABS
1.0000 | ORAL_TABLET | Freq: Once | ORAL | Status: AC
  Administered 2023-10-27: 1 via ORAL
  Filled 2023-10-27: qty 1
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
## 2023-10-27 NOTE — ED Notes (Signed)
Patient left.

## 2023-10-29 ENCOUNTER — Other Ambulatory Visit: Payer: Self-pay

## 2023-10-29 ENCOUNTER — Emergency Department (HOSPITAL_COMMUNITY)
Admission: EM | Admit: 2023-10-29 | Discharge: 2023-10-29 | Disposition: A | Discharge status: Home / Self Care | Payer: Medicaid Other | Attending: Emergency Medicine | Admitting: Emergency Medicine

## 2023-10-29 ENCOUNTER — Emergency Department (HOSPITAL_COMMUNITY): Payer: Medicaid Other

## 2023-10-29 ENCOUNTER — Encounter (HOSPITAL_COMMUNITY): Payer: Self-pay | Admitting: *Deleted

## 2023-10-29 DIAGNOSIS — S82141A Displaced bicondylar fracture of right tibia, initial encounter for closed fracture: Secondary | ICD-10-CM | POA: Diagnosis not present

## 2023-10-29 DIAGNOSIS — Z794 Long term (current) use of insulin: Secondary | ICD-10-CM | POA: Diagnosis not present

## 2023-10-29 DIAGNOSIS — S12101A Unspecified nondisplaced fracture of second cervical vertebra, initial encounter for closed fracture: Secondary | ICD-10-CM

## 2023-10-29 DIAGNOSIS — M542 Cervicalgia: Secondary | ICD-10-CM | POA: Diagnosis present

## 2023-10-29 DIAGNOSIS — Y92410 Unspecified street and highway as the place of occurrence of the external cause: Secondary | ICD-10-CM | POA: Diagnosis not present

## 2023-10-29 LAB — BASIC METABOLIC PANEL
Anion gap: 10 (ref 5–15)
BUN: 9 mg/dL (ref 6–20)
CO2: 20 mmol/L — ABNORMAL LOW (ref 22–32)
Calcium: 8.8 mg/dL — ABNORMAL LOW (ref 8.9–10.3)
Chloride: 106 mmol/L (ref 98–111)
Creatinine, Ser: 0.61 mg/dL (ref 0.61–1.24)
GFR, Estimated: >60 mL/min (ref >60)
Glucose, Bld: 106 mg/dL — ABNORMAL HIGH (ref 70–99)
Potassium: 4.4 mmol/L (ref 3.5–5.1)
Sodium: 136 mmol/L (ref 135–145)

## 2023-10-29 LAB — CBC WITH DIFFERENTIAL/PLATELET
Abs Immature Granulocytes: 0.01 10*3/uL (ref 0.00–0.07)
Basophils Absolute: 0 10*3/uL (ref 0.0–0.1)
Basophils Relative: 1 %
Eosinophils Absolute: 0.2 10*3/uL (ref 0.0–0.5)
Eosinophils Relative: 3 %
HCT: 37.4 % — ABNORMAL LOW (ref 39.0–52.0)
Hemoglobin: 12.1 g/dL — ABNORMAL LOW (ref 13.0–17.0)
Immature Granulocytes: 0 %
Lymphocytes Relative: 38 %
Lymphs Abs: 2.5 10*3/uL (ref 0.7–4.0)
MCH: 32.2 pg (ref 26.0–34.0)
MCHC: 32.4 g/dL (ref 30.0–36.0)
MCV: 99.5 fL (ref 80.0–100.0)
Monocytes Absolute: 0.5 10*3/uL (ref 0.1–1.0)
Monocytes Relative: 8 %
Neutro Abs: 3.2 10*3/uL (ref 1.7–7.7)
Neutrophils Relative %: 50 %
Platelets: 209 10*3/uL (ref 150–400)
RBC: 3.76 MIL/uL — ABNORMAL LOW (ref 4.22–5.81)
RDW: 12.8 % (ref 11.5–15.5)
WBC: 6.4 10*3/uL (ref 4.0–10.5)
nRBC: 0 % (ref 0.0–0.2)

## 2023-10-29 MED ORDER — OXYCODONE HCL 5 MG PO TABS
10.0000 mg | ORAL_TABLET | Freq: Once | ORAL | Status: AC
  Administered 2023-10-29: 10 mg via ORAL
  Filled 2023-10-29: qty 2

## 2023-10-29 MED ORDER — IOHEXOL 350 MG/ML SOLN
75.0000 mL | Freq: Once | INTRAVENOUS | Status: AC | PRN
  Administered 2023-10-29: 75 mL via INTRAVENOUS

## 2023-10-29 MED ORDER — IOHEXOL 300 MG/ML  SOLN: 100.0000 mL | Freq: Once | INTRAMUSCULAR | Status: DC | PRN

## 2023-10-29 NOTE — Discharge Instructions (Addendum)
Return for any problem.   It is imperative that you try to maintain cervical collar placement and she can follow-up in the clinic with Spine specialties.  Additionally, your right knee should be in a knee immobilizer and you should be nonweightbearing until he can follow-up with either your regular orthopedic care or with Dr. Blanchie Dessert.
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
## 2023-10-29 NOTE — ED Notes (Signed)
Patient transported to CT 

## 2023-10-29 NOTE — ED Notes (Signed)
C-collar applied, d/w pt with EDP.

## 2023-11-06 ENCOUNTER — Other Ambulatory Visit (HOSPITAL_COMMUNITY): Payer: Self-pay | Admitting: Student

## 2023-11-06 DIAGNOSIS — S12191A Other nondisplaced fracture of second cervical vertebra, initial encounter for closed fracture: Secondary | ICD-10-CM

## 2023-11-16 ENCOUNTER — Ambulatory Visit (HOSPITAL_COMMUNITY)
Admission: RE | Admit: 2023-11-16 | Discharge: 2023-11-16 | Disposition: A | Discharge status: Home / Self Care | Payer: Medicaid Other | Source: Ambulatory Visit | Attending: Student | Admitting: Student

## 2023-11-16 ENCOUNTER — Ambulatory Visit (HOSPITAL_COMMUNITY): Payer: Medicaid Other

## 2023-11-16 DIAGNOSIS — S12191A Other nondisplaced fracture of second cervical vertebra, initial encounter for closed fracture: Secondary | ICD-10-CM | POA: Diagnosis present

## 2023-11-29 ENCOUNTER — Other Ambulatory Visit (HOSPITAL_COMMUNITY): Payer: Self-pay | Admitting: Student

## 2023-11-29 DIAGNOSIS — R29898 Other symptoms and signs involving the musculoskeletal system: Secondary | ICD-10-CM

## 2023-12-04 ENCOUNTER — Ambulatory Visit (HOSPITAL_COMMUNITY): Payer: BLUE CROSS/BLUE SHIELD

## 2023-12-07 ENCOUNTER — Ambulatory Visit (HOSPITAL_COMMUNITY): Admission: RE | Admit: 2023-12-07 | Payer: Medicaid Other | Source: Ambulatory Visit

## 2023-12-07 ENCOUNTER — Encounter (HOSPITAL_COMMUNITY): Payer: Self-pay

## 2023-12-08 ENCOUNTER — Ambulatory Visit (HOSPITAL_COMMUNITY)
Admission: RE | Admit: 2023-12-08 | Discharge: 2023-12-08 | Disposition: A | Discharge status: Home / Self Care | Payer: Medicaid Other | Source: Ambulatory Visit | Attending: Student | Admitting: Student

## 2023-12-08 DIAGNOSIS — R29898 Other symptoms and signs involving the musculoskeletal system: Secondary | ICD-10-CM | POA: Diagnosis present

## 2024-06-05 ENCOUNTER — Other Ambulatory Visit (HOSPITAL_COMMUNITY): Payer: Self-pay | Admitting: Infectious Diseases

## 2024-06-05 DIAGNOSIS — N5082 Scrotal pain: Secondary | ICD-10-CM

## 2024-06-07 ENCOUNTER — Ambulatory Visit (HOSPITAL_COMMUNITY)

## 2024-06-07 ENCOUNTER — Encounter (HOSPITAL_COMMUNITY): Payer: Self-pay

## 2024-06-08 ENCOUNTER — Emergency Department (HOSPITAL_COMMUNITY)

## 2024-06-08 ENCOUNTER — Emergency Department (HOSPITAL_COMMUNITY)
Admission: EM | Admit: 2024-06-08 | Discharge: 2024-06-08 | Disposition: A | Discharge status: Home / Self Care | Attending: Emergency Medicine | Admitting: Emergency Medicine

## 2024-06-08 DIAGNOSIS — Z79899 Other long term (current) drug therapy: Secondary | ICD-10-CM | POA: Insufficient documentation

## 2024-06-08 DIAGNOSIS — N433 Hydrocele, unspecified: Secondary | ICD-10-CM | POA: Diagnosis not present

## 2024-06-08 DIAGNOSIS — R600 Localized edema: Secondary | ICD-10-CM | POA: Insufficient documentation

## 2024-06-08 DIAGNOSIS — N492 Inflammatory disorders of scrotum: Secondary | ICD-10-CM | POA: Diagnosis present

## 2024-06-08 LAB — COMPREHENSIVE METABOLIC PANEL WITH GFR
ALT: 11 U/L (ref 0–44)
AST: 37 U/L (ref 15–41)
Albumin: 3.4 g/dL — ABNORMAL LOW (ref 3.5–5.0)
Alkaline Phosphatase: 133 U/L — ABNORMAL HIGH (ref 38–126)
Anion gap: 14 (ref 5–15)
BUN: 9 mg/dL (ref 6–20)
CO2: 20 mmol/L — ABNORMAL LOW (ref 22–32)
Calcium: 8.8 mg/dL — ABNORMAL LOW (ref 8.9–10.3)
Chloride: 104 mmol/L (ref 98–111)
Creatinine, Ser: 0.55 mg/dL — ABNORMAL LOW (ref 0.61–1.24)
GFR, Estimated: >60 mL/min (ref >60)
Glucose, Bld: 84 mg/dL (ref 70–99)
Potassium: 3.7 mmol/L (ref 3.5–5.1)
Sodium: 137 mmol/L (ref 135–145)
Total Bilirubin: 1.1 mg/dL (ref 0.0–1.2)
Total Protein: 6.7 g/dL (ref 6.5–8.1)

## 2024-06-08 LAB — CBC WITH DIFFERENTIAL/PLATELET
Abs Immature Granulocytes: 0.03 K/uL (ref 0.00–0.07)
Basophils Absolute: 0 K/uL (ref 0.0–0.1)
Basophils Relative: 0 %
Eosinophils Absolute: 0.3 K/uL (ref 0.0–0.5)
Eosinophils Relative: 3 %
HCT: 34.6 % — ABNORMAL LOW (ref 39.0–52.0)
Hemoglobin: 11.2 g/dL — ABNORMAL LOW (ref 13.0–17.0)
Immature Granulocytes: 0 %
Lymphocytes Relative: 30 %
Lymphs Abs: 2.9 K/uL (ref 0.7–4.0)
MCH: 32.1 pg (ref 26.0–34.0)
MCHC: 32.4 g/dL (ref 30.0–36.0)
MCV: 99.1 fL (ref 80.0–100.0)
Monocytes Absolute: 0.7 K/uL (ref 0.1–1.0)
Monocytes Relative: 8 %
Neutro Abs: 5.8 K/uL (ref 1.7–7.7)
Neutrophils Relative %: 59 %
Platelets: 206 K/uL (ref 150–400)
RBC: 3.49 MIL/uL — ABNORMAL LOW (ref 4.22–5.81)
RDW: 14.4 % (ref 11.5–15.5)
WBC: 9.8 K/uL (ref 4.0–10.5)
nRBC: 0 % (ref 0.0–0.2)

## 2024-06-08 LAB — URINALYSIS, ROUTINE W REFLEX MICROSCOPIC
Bilirubin Urine: NEGATIVE
Glucose, UA: NEGATIVE mg/dL
Hgb urine dipstick: NEGATIVE
Ketones, ur: NEGATIVE mg/dL
Nitrite: NEGATIVE
Protein, ur: NEGATIVE mg/dL
Specific Gravity, Urine: 1.005 (ref 1.005–1.030)
pH: 7 (ref 5.0–8.0)

## 2024-06-08 LAB — LACTIC ACID, PLASMA: Lactic Acid, Venous: 1.9 mmol/L (ref 0.5–1.9)

## 2024-06-08 MED ORDER — OXYCODONE-ACETAMINOPHEN 5-325 MG PO TABS: 1.0000 | ORAL_TABLET | Freq: Four times a day (QID) | ORAL | 0 refills | Status: AC | PRN

## 2024-06-08 MED ORDER — MORPHINE SULFATE (PF) 4 MG/ML IV SOLN
4.0000 mg | Freq: Once | INTRAVENOUS | Status: AC
  Administered 2024-06-08: 4 mg via INTRAVENOUS
  Filled 2024-06-08: qty 1

## 2024-06-08 MED ORDER — LIDOCAINE HCL (PF) 1 % IJ SOLN
20.0000 mL | Freq: Once | INTRAMUSCULAR | Status: AC
  Administered 2024-06-08: 20 mL
  Filled 2024-06-08: qty 30

## 2024-06-08 NOTE — ED Provider Notes (Signed)
 [facility] Provider Note   CSN: [id number] Arrival date & time: 06/08/24  1717     Patient presents with: Groin Swelling   Glenn Murphy is a 48 y.o. male.  HPI Patient is a 48 year old male presenting ED today for concerns for left testicular swelling and pain and possible abscess formation, known to have a medical history of scrotal abscess, rectourethral fistula, recurrent epididymoorchitis .  Patient states that the pain has been increasing for the last 3 days as well as swelling.  Noted that he has had urinary hesitancy.  Seen recently by infectious disease on 06/04/2024 and was prescribed Augmentin prophylactically for concerns for possible UTI for him concern for dysuria.  Endorses chronic bilateral lower leg edema.  Taking fluid pills with relief.  Denies fever, headache, vision changes, chest pain, shortness of breath, abdominal pain, nausea, vomiting, diarrhea, hematochezia, melena, hematuria.    Prior to Admission medications   Medication Sig Start Date End Date Taking? Authorizing Provider  oxyCODONE -acetaminophen  (PERCOCET/ROXICET) 5-325 MG tablet Take 1 tablet by mouth every 6 (six) hours as needed for severe pain (pain score 7-10). 06/08/24  Yes Beola Terrall RAMAN, PA-C  allopurinol (ZYLOPRIM) 300 MG tablet Take 300 mg by mouth daily. 11/16/15   [provider]  amLODipine  (NORVASC ) 5 MG tablet Take 1 tablet (5 mg total) by mouth daily. 09/10/20   Wieters, Hallie C, PA-C  blood glucose meter kit and supplies KIT Dispense based on patient and insurance preference. Use up to four times daily as directed. (FOR ICD-9 250.00, 250.01). 01/20/20   Christobal Guadalajara, MD  busPIRone (BUSPAR) 10 MG tablet Take 10 mg by mouth 3 (three) times daily. 09/10/19   [provider]  clindamycin  (CLINDAGEL) 1 % gel Apply 1 application topically See admin instructions. Apply to bumps on face and body 1-2 times daily as needed. 12/06/19    [provider]  ferrous sulfate 325 (65 FE) MG tablet Take 325 mg by mouth daily.    [provider]  furosemide (LASIX) 20 MG tablet Take 20 mg by mouth daily. 02/11/20   [provider]  gabapentin (NEURONTIN) 600 MG tablet Take 600 mg by mouth 3 (three) times daily. 12/31/19   [provider]  ibuprofen  (ADVIL ) 800 MG tablet Take 800 mg by mouth every 8 (eight) hours as needed.    [provider]  Insulin  Pen Needle (PEN NEEDLES 3/16) 31G X 5 MM MISC USE WITH INSULIN  PEN 01/20/20   Christobal Guadalajara, MD  levofloxacin  (LEVAQUIN ) 500 MG tablet Take 1 tablet (500 mg total) by mouth daily. 07/01/20   Rolinda Rogue, MD  Magnesium 200 MG TABS Take 200 mg by mouth daily.    [provider]  metoprolol  succinate (TOPROL -XL) 25 MG 24 hr tablet Take 1 tablet (25 mg total) by mouth daily. 09/10/20   Wieters, Hallie C, PA-C  Milk Thistle Extract 175 MG TABS Take 175 mg by mouth daily.    [provider]  Omega-3 1000 MG CAPS Take 1 capsule by mouth daily.    [provider]  sildenafil (REVATIO) 20 MG tablet SMARTSIG:1-5 Tablet(s) By Mouth Daily PRN 05/18/20   [provider]  triamcinolone cream (KENALOG) 0.1 % Apply 1 application topically 2 (two) times daily as needed.  12/19/15   [provider]  Vitamin D, Ergocalciferol, (DRISDOL) 1.25 MG (50000 UNIT) CAPS capsule Take 50,000 Units by mouth once a week. 12/31/19   [provider]  insulin  aspart (NOVOLOG  FLEXPEN) 100 UNIT/ML FlexPen Inject 4 Units into the skin 3 (three) times daily with meals. 01/24/20 07/01/20  Ghimire, Kuber, MD  insulin  glargine (LANTUS  SOLOSTAR) 100 UNIT/ML Solostar Pen Inject 10 Units into the skin 2 (two) times daily. 01/24/20 04/23/20  Raenelle Coria, MD    Allergies: Amoxicillin  and Other    Review of Systems  Genitourinary:  Positive for dysuria, scrotal swelling and testicular pain.  All other systems reviewed and are negative.   Updated  Vital Signs BP 124/76   Pulse (!) 105   Temp 98.5 F (36.9 C)   Resp 18   Ht 5' 9 (1.753 m)   Wt 127 kg   SpO2 100%   BMI 41.35 kg/m   Physical Exam Vitals and nursing note reviewed. Exam conducted with a chaperone present.  Constitutional:      General: He is not in acute distress.    Appearance: Normal appearance. He is not ill-appearing or diaphoretic.  HENT:     Head: Normocephalic and atraumatic.  Eyes:     General: No scleral icterus.       Right eye: No discharge.        Left eye: No discharge.     Extraocular Movements: Extraocular movements intact.     Conjunctiva/sclera: Conjunctivae normal.  Cardiovascular:     Rate and Rhythm: Normal rate and regular rhythm.     Pulses: Normal pulses.     Heart sounds: Normal heart sounds. No murmur heard.    No friction rub. No gallop.  Pulmonary:     Effort: Pulmonary effort is normal. No respiratory distress.     Breath sounds: No stridor. No wheezing, rhonchi or rales.  Chest:     Chest wall: No tenderness.  Abdominal:     General: Abdomen is flat. There is no distension.     Palpations: Abdomen is soft.     Tenderness: There is no abdominal tenderness. There is no right CVA tenderness, left CVA tenderness, guarding or rebound.  Genitourinary:    Pubic Area: No rash.      Penis: Erythema and tenderness present.      Testes:        Right: Testicular hydrocele present. Tenderness or swelling not present.        Left: Testicular hydrocele present. Tenderness or swelling not present.     Epididymis:     Right: Normal.     Left: Normal.     Comments: Noted to have large area of fluctuance measuring approximately 5 x 5 cm to the anterior left side of scrotum, erythematous and tender. Musculoskeletal:        General: No swelling, deformity or signs of injury.     Cervical back: Normal range of motion. No rigidity.  Skin:    General: Skin is warm and dry.     Findings: No bruising, erythema or lesion.  Neurological:      General: No focal deficit present.     Mental Status: He is alert and oriented to person, place, and time. Mental status is at baseline.     Sensory: No sensory deficit.     Motor: No weakness.  Psychiatric:        Mood and Affect: Mood normal.     (all labs ordered are listed, but only abnormal results are displayed) Labs Reviewed  CBC WITH DIFFERENTIAL/PLATELET - Abnormal; Notable for the following components:      Result Value   RBC 3.49 (*)  Hemoglobin 11.2 (*)    HCT 34.6 (*)    All other components within normal limits  URINALYSIS, ROUTINE W REFLEX MICROSCOPIC - Abnormal; Notable for the following components:   Leukocytes,Ua MODERATE (*)    Bacteria, UA RARE (*)    All other components within normal limits  COMPREHENSIVE METABOLIC PANEL WITH GFR - Abnormal; Notable for the following components:   CO2 20 (*)    Creatinine, Ser 0.55 (*)    Calcium 8.8 (*)    Albumin 3.4 (*)    Alkaline Phosphatase 133 (*)    All other components within normal limits  URINE CULTURE  LACTIC ACID, PLASMA  LACTIC ACID, PLASMA    EKG: None  Radiology: US  SCROTUM W/DOPPLER Result Date: 06/08/2024 CLINICAL DATA:  Testicular swelling EXAM: SCROTAL ULTRASOUND DOPPLER ULTRASOUND OF THE TESTICLES TECHNIQUE: Complete ultrasound examination of the testicles, epididymis, and other scrotal structures was performed. Color and spectral Doppler ultrasound were also utilized to evaluate blood flow to the testicles. COMPARISON:  Scrotal ultrasound 12/27/2021 FINDINGS: Right testicle Measurements: 3.8 x 2.3 x 2.7 cm. No mass or microlithiasis visualized. Left testicle Measurements: 3.3 x 2.0 x 3.0 cm. No mass or microlithiasis visualized. Right epididymis:  Normal in size and appearance. Left epididymis:  Normal in size and appearance. Hydrocele:  There are trace bilateral hydroceles. Varicocele:  None visualized. Pulsed Doppler interrogation of both testes demonstrates normal low resistance arterial and  venous waveforms bilaterally. Other: There is a 4.8 x 2.7 x 4.7 cm complex collection anterior to the left testicle within the scrotal wall. This contains internal echoes, but no internal vascularity. IMPRESSION: 1. 4.8 x 2.7 x 4.7 cm complex collection anterior to the left testicle within the scrotal wall. This contains internal echoes, but no internal vascularity. This may represent hematoma or abscess. Other etiologies are not excluded. 2. Trace bilateral hydroceles. 3. No evidence of testicular torsion. Electronically Signed   By: Greig Pique M.D.   On: 06/08/2024 20:32     .Incision and Drainage  Date/Time: 06/08/2024 10:15 PM  Performed by: Beola Terrall RAMAN, PA-C Authorized by: Beola Terrall RAMAN, PA-C   Consent:    Consent obtained:  Verbal   Risks, benefits, and alternatives were discussed: yes     Risks discussed:  Bleeding, pain, infection, damage to other organs and incomplete drainage   Alternatives discussed:  Delayed treatment and no treatment Universal protocol:    Procedure explained and questions answered to patient or proxy's satisfaction: yes     Relevant documents present and verified: yes     Test results available : yes     Imaging studies available: yes     Required blood products, implants, devices, and special equipment available: yes     Immediately prior to procedure, a time out was called: yes     Patient identity confirmed:  Verbally with patient and arm band Location:    Type:  Abscess   Size:  4.8 x 2.7 x 4.7 cm   Location:  Anogenital   Anogenital location:  Scrotal wall Pre-procedure details:    Skin preparation:  Antiseptic wash and chlorhexidine  Sedation:    Sedation type:  None Anesthesia:    Anesthesia method:  None Procedure type:    Complexity:  Simple Procedure details:    Ultrasound guidance: no     Needle aspiration: yes     Needle size:  20 G   Incision types:  Single straight   Wound management:  Probed and deloculated, irrigated  with  saline and extensive cleaning   Drainage:  Bloody and purulent   Drainage amount:  Copious   Wound treatment:  Wound left open   Packing materials:  None Post-procedure details:    Procedure completion:  Tolerated well, no immediate complications    Medications Ordered in the ED  morphine  (PF) 4 MG/ML injection 4 mg (4 mg Intravenous Given 06/08/24 2024)  lidocaine  (PF) (XYLOCAINE ) 1 % injection 20 mL (20 mLs Infiltration Given by Other 06/08/24 2204)    Clinical Course as of 06/09/24 0018  Sat Jun 08, 2024  2127 Spoke with Dr. Nieves with urology who noted that with this being a scrotal abscess that is within the scrotal wall, this can be I&D in the ER, with no surgical drain needed.  Be sure to open up the wound approximately 15 mm but then otherwise does not need any packing. [CB]    Clinical Course User Index [CB] Beola Terrall RAMAN, PA-C                               Medical Decision Making Amount and/or Complexity of Data Reviewed Labs: ordered. Radiology: ordered.  Risk Prescription drug management.   This patient is a 48 year old male who presents to the ED for concern of scrotal pain into the left side of the scrotum, known to complicate history of urethral rectal fistula as well as having previous abscess needing surgical excision as well as recurrent UTIs.  Came today suspecting likely abscess to left side of scrotum.  On physical exam, patient is in no acute distress, afebrile, alert and orient x 4, speaking in full sentences, nontachypneic, nontachycardic.  Notably does have obvious fluctuance noted to left anterior aspect of scrotum.  Erythematous and tender.  No tenderness noted to testicles bilaterally as well as tenderness not present on epididymis bilaterally. Exam is otherwise unremarkable.  Ultrasound was ordered as well as labs.  Labs show baseline hemoglobin, baseline CMP, UA showing moderate leukocytes but otherwise unremarkable Lactic acid  unremarkable Ultrasound scrotum does reveal a large abscess in the scrotal wall.  Spoke to Dr. Nieves with urology who wished for patient undergo I&D in the ER.  No further recommendations otherwise.  Area was cleaned with chlorhexidine  as well as topical pain medication of lidocaine  injection.  Area was aspirated and purulent fluid was noted.  Copious purulent and bloody drainage noted from the large abscess to left side of the scrotum after incision made.  Patient noted immediate relief.  Wound was covered with dressing.  Provided care instructions.  He is already prescribed Augmentin for infectious disease, having just picked him up today.  Will have him continue with this medication.  Will have him continue to follow-up with urology as well as infectious disease.  Will provide short course of pain medication.  provided strict return ER precautions.  Patient vital signs have remained stable throughout the course of patient's time in the ED. Low suspicion for any other emergent pathology at this time. I believe this patient is safe to be discharged. Provided strict return to ER precautions. Patient expressed agreement and understanding of plan. All questions were answered.  Differential diagnoses prior to evaluation: The emergent differential diagnosis includes, but is not limited to, abscess, cellulitis, orchitis, mass,. This is not an exhaustive differential.   Past Medical History / Co-morbidities / Social History: rectourethral fistul, epididymoorchitis    Additional history: Chart reviewed. Pertinent results include:  Seen on 05/29/2024 by infectious disease, reporting left testicular swelling at 7-10 days at that time without fever or chills.  Started on was placed on Augmentin.  UA positive for UTI.  Last seen by urology in March 2025 by Dr. Lamar Sar  Lab Tests/Imaging studies: I personally interpreted labs/imaging and the pertinent results include: CBC notes baseline  hemoglobin. CMP baseline for patient  UA notes moderate leukocytes otherwise unremarkable Lactic acid unremarkable   Ultrasound scrotum shows abscess measures 4.8 x 2.7 x 4.7 cm to the anterior to the left testicle within the scrotal wall.  Trace bilateral hydroceles also noted  I agree with the radiologist interpretation.  Medications: I ordered medication including morphine .  I have reviewed the patients home medicines and have made adjustments as needed.  Critical Interventions: None  Social Determinants of Health: Already being followed by urology and infectious disease for these issues.  Disposition: After consideration of the diagnostic results and the patients response to treatment, I feel that the patient would benefit from discharge and treatment as above.   emergency department workup does not suggest an emergent condition requiring admission or immediate intervention beyond what has been performed at this time. The plan is: Follow-up with PCP, follow-up with urology, follow-up with infectious ease, take Augmentin as already prescribed, return for any new or worsening symptoms. The patient is safe for discharge and has been instructed to return immediately for worsening symptoms, change in symptoms or any other concerns.  Final diagnoses:  Scrotal wall abscess    ED Discharge Orders          Ordered    oxyCODONE -acetaminophen  (PERCOCET/ROXICET) 5-325 MG tablet  Every 6 hours PRN        06/08/24 2214               Beola Terrall RAMAN, PA-C 06/09/24 0018    Lenor Hollering, MD 06/09/24 [phone]

## 2024-06-08 NOTE — Discharge Instructions (Addendum)
 He was seen today for scrotal abscess.  Recommend you continue to follow-up with your PCP as well as infectious disease for further monitoring.  Continue take the Augmentin that you are already prescribed as this will also help with preventing recurrence of this.  Recommend you continue to change dressings regularly to allow for drainage.  Clean with soap and water .  Keep covered until wound is fully healed.  Return to ED for any new or worsening symptoms.  Continue Tylenol  as pain medication.  Additionally have sent in Percocet to use only for instances of extreme pain.  Please use family as this can cause risk for addiction, constipation as well bad dreams  Take Tylenol  (acetominophen)  650mg  every 4-6 hours, as needed for pain or fever. Do not take more than 4,000 mg in a 24-hour period. As this may cause liver damage. While this is rare, if you begin to develop yellowing of the skin or eyes, stop taking and return to ER immediately.

## 2024-06-08 NOTE — ED Triage Notes (Signed)
 Patient reports swollen testicle starting 3 weeks ago  Patient says this has happened 3 times in 4 years Pain rated 12/10 Had ultrasound done yesterday

## 2024-06-08 NOTE — ED Notes (Signed)
 While redrawing blood the pt's IV was accidentally removed.

## 2024-06-11 LAB — URINE CULTURE: Culture: 20000 — AB

## 2024-06-12 ENCOUNTER — Telehealth (HOSPITAL_BASED_OUTPATIENT_CLINIC_OR_DEPARTMENT_OTHER): Payer: Self-pay | Admitting: *Deleted

## 2024-06-12 NOTE — Progress Notes (Signed)
 ED Antimicrobial Stewardship Positive Culture Follow Up   Glenn Murphy is an 48 y.o. male who presented to [facility] on 06/08/2024 with a chief complaint of  Chief Complaint  Patient presents with   Groin Swelling    Recent Results (from the past 720 hours)  Urine Culture     Status: Abnormal   Collection Time: 06/08/24  7:50 PM   Specimen: Urine, Clean Catch  Result Value Ref Range Status   Specimen Description   Final    URINE, CLEAN CATCH Performed at [facility], [address], [place], [place] [address]    Special Requests   Final    NONE Performed at [facility], [address], [place], [place] [address]    Culture 20,000 COLONIES/mL ESCHERICHIA COLI (A)  Final   Report Status 06/11/2024 FINAL  Final   Organism ID, Bacteria ESCHERICHIA COLI (A)  Final      Susceptibility   Escherichia coli - MIC*    AMPICILLIN >=32 RESISTANT Resistant     CEFAZOLIN  (URINE) Value in next row Sensitive      8 SENSITIVEThis is a modified FDA-approved test that has been validated and its performance characteristics determined by the reporting laboratory.  This laboratory is certified under the Clinical Laboratory Improvement Amendments CLIA as qualified to perform high complexity clinical laboratory testing.    CEFEPIME Value in next row Sensitive      8 SENSITIVEThis is a modified FDA-approved test that has been validated and its performance characteristics determined by the reporting laboratory.  This laboratory is certified under the Clinical Laboratory Improvement Amendments CLIA as qualified to perform high complexity clinical laboratory testing.    ERTAPENEM Value in next row Sensitive      8 SENSITIVEThis is a modified FDA-approved test that has been validated and its performance characteristics determined by the reporting laboratory.  This laboratory is certified under the Clinical Laboratory Improvement Amendments CLIA as qualified to perform  high complexity clinical laboratory testing.    CEFTRIAXONE  Value in next row Sensitive      8 SENSITIVEThis is a modified FDA-approved test that has been validated and its performance characteristics determined by the reporting laboratory.  This laboratory is certified under the Clinical Laboratory Improvement Amendments CLIA as qualified to perform high complexity clinical laboratory testing.    CIPROFLOXACIN Value in next row Sensitive      8 SENSITIVEThis is a modified FDA-approved test that has been validated and its performance characteristics determined by the reporting laboratory.  This laboratory is certified under the Clinical Laboratory Improvement Amendments CLIA as qualified to perform high complexity clinical laboratory testing.    GENTAMICIN Value in next row Sensitive      8 SENSITIVEThis is a modified FDA-approved test that has been validated and its performance characteristics determined by the reporting laboratory.  This laboratory is certified under the Clinical Laboratory Improvement Amendments CLIA as qualified to perform high complexity clinical laboratory testing.    NITROFURANTOIN Value in next row Sensitive      8 SENSITIVEThis is a modified FDA-approved test that has been validated and its performance characteristics determined by the reporting laboratory.  This laboratory is certified under the Clinical Laboratory Improvement Amendments CLIA as qualified to perform high complexity clinical laboratory testing.    TRIMETH /SULFA  Value in next row Sensitive      8 SENSITIVEThis is a modified FDA-approved test that has been validated and its performance characteristics determined by the reporting laboratory.  This laboratory is certified under the Clinical Laboratory Improvement Amendments CLIA as qualified to perform high complexity clinical laboratory testing.    AMPICILLIN/SULBACTAM Value in next row Resistant      8 SENSITIVEThis is a modified FDA-approved test that has been  validated and its performance characteristics determined by the reporting laboratory.  This laboratory is certified under the Clinical Laboratory Improvement Amendments CLIA as qualified to perform high complexity clinical laboratory testing.    PIP/TAZO Value in next row Sensitive ug/mL     <=4 SENSITIVEThis is a modified FDA-approved test that has been validated and its performance characteristics determined by the reporting laboratory.  This laboratory is certified under the Clinical Laboratory Improvement Amendments CLIA as qualified to perform high complexity clinical laboratory testing.    MEROPENEM Value in next row Sensitive      <=4 SENSITIVEThis is a modified FDA-approved test that has been validated and its performance characteristics determined by the reporting laboratory.  This laboratory is certified under the Clinical Laboratory Improvement Amendments CLIA as qualified to perform high complexity clinical laboratory testing.    * 20,000 COLONIES/mL ESCHERICHIA COLI    [x]  Treated with Augmentin, organism resistant to prescribed antimicrobial  48 YOM with history of prior scrotal abscess, rectourethral fistula, and recurrent epididymoorchitis recent seen by [facility] on 8/26 and prescribed Augmentin. UCx was checked at that time but was a poor sample with multiple bacteria present.   The patient presented to the [facility]ED on 8/30 with L-testicular swelling, concern for abscess. Scrotal ultrasound showed fluid/abscess which underwent I&D in the ED - no cultures sent from the I&D procedure however UA/UTI culture sent which showed E.coli resistant to the prescribed Augmentin. The plan will be to transition the antibiotic regimen to add coverage for this and keep anaerobic coverage. Then to ensure the patient's ID-MD is aware of the updated culture data.   New antibiotic prescription: D/c Augmentin and start Cefadroxil 1g po bid + Metronidazole  500 mg po bid x 14d.   Fax results to [facility]  (Fax [phone]). Tell the patient to ensure follow-up with [facility], Dr. Donavan.   ED Provider: [name], PA  Thank you for allowing pharmacy to be a part of this patient's care.  Almarie Lunger, PharmD, BCPS, BCIDP Infectious Diseases Clinical Pharmacist 06/12/2024 8:41 AM   **Pharmacist phone directory can now be found on [email or web address] ([email or web address]).  Listed under [facility].

## 2024-06-12 NOTE — Telephone Encounter (Signed)
 Post ED Visit - Positive Culture Follow-up: Successful Patient Follow-Up  Culture assessed and recommendations reviewed by:  []  Rankin Dee, Pharm.D. []  Venetia Gully, Pharm.D., BCPS AQ-ID []  Garrel Crews, Pharm.D., BCPS [x]  Almarie Lunger, Pharm.D., BCPS []  [place], [address].D., BCPS, AAHIVP []  Rosaline Bihari, Pharm.D., BCPS, AAHIVP []  Vernell Meier, PharmD, BCPS []  Latanya Hint, PharmD, BCPS []  Donald Medley, PharmD, BCPS []  Rocky Bold, PharmD  Positive urine culture  []  Patient discharged without antimicrobial prescription and treatment is now indicated [x]  Organism is resistant to prescribed ED discharge antimicrobial []  Patient with positive blood cultures  Changes discussed with ED provider: [name], PA New antibiotic prescription Cefadroxil 1g po BID x 14 days; Metronidazole  500mg  po BID x 14 days. Called to [facility], [place], [place]. Faxed culture report to [facility], [name], MD.  Contacted patient, date 06/12/24, time 1020   Glenn Murphy 06/12/2024, 10:28 AM

## 2024-07-02 ENCOUNTER — Other Ambulatory Visit (HOSPITAL_BASED_OUTPATIENT_CLINIC_OR_DEPARTMENT_OTHER): Payer: Self-pay

## 2024-07-02 ENCOUNTER — Encounter (HOSPITAL_BASED_OUTPATIENT_CLINIC_OR_DEPARTMENT_OTHER): Payer: Self-pay

## 2024-07-02 MED ORDER — SEMAGLUTIDE-WEIGHT MANAGEMENT 0.25 MG/0.5ML ~~LOC~~ SOAJ
0.2500 mg | SUBCUTANEOUS | 0 refills | Status: AC
  Filled 2024-07-02: qty 2, 28d supply, fill #0

## 2024-07-03 ENCOUNTER — Other Ambulatory Visit (HOSPITAL_BASED_OUTPATIENT_CLINIC_OR_DEPARTMENT_OTHER): Payer: Self-pay

## 2024-08-01 ENCOUNTER — Other Ambulatory Visit (HOSPITAL_BASED_OUTPATIENT_CLINIC_OR_DEPARTMENT_OTHER): Payer: Self-pay

## 2024-08-29 ENCOUNTER — Emergency Department (HOSPITAL_COMMUNITY)

## 2024-08-29 ENCOUNTER — Encounter (HOSPITAL_COMMUNITY): Payer: Self-pay | Admitting: Emergency Medicine

## 2024-08-29 ENCOUNTER — Emergency Department (HOSPITAL_COMMUNITY)
Admission: EM | Admit: 2024-08-29 | Discharge: 2024-08-29 | Disposition: A | Discharge status: Home / Self Care | Attending: Emergency Medicine | Admitting: Emergency Medicine

## 2024-08-29 DIAGNOSIS — N492 Inflammatory disorders of scrotum: Secondary | ICD-10-CM | POA: Insufficient documentation

## 2024-08-29 DIAGNOSIS — Z79899 Other long term (current) drug therapy: Secondary | ICD-10-CM | POA: Diagnosis not present

## 2024-08-29 DIAGNOSIS — Z794 Long term (current) use of insulin: Secondary | ICD-10-CM | POA: Diagnosis not present

## 2024-08-29 MED ORDER — LEVOFLOXACIN 500 MG PO TABS: 500.0000 mg | ORAL_TABLET | Freq: Every day | ORAL | 0 refills | Status: AC

## 2024-08-29 MED ORDER — LIDOCAINE HCL (PF) 1 % IJ SOLN
5.0000 mL | Freq: Once | INTRAMUSCULAR | Status: AC
  Administered 2024-08-29: 5 mL
  Filled 2024-08-29: qty 30

## 2024-08-29 MED ORDER — OXYCODONE-ACETAMINOPHEN 5-325 MG PO TABS
2.0000 | ORAL_TABLET | Freq: Once | ORAL | Status: DC
  Filled 2024-08-29: qty 2

## 2024-08-29 NOTE — ED Provider Notes (Addendum)
 [place] EMERGENCY DEPARTMENT AT [facility] Provider Note   CSN: [id number] Arrival date & time: 08/29/24  1210     Patient presents with: Abscess (Testicle )   Glenn Murphy is a 48 y.o. male patient with history of anogenital fistula and recurrent scrotal abscess who presents to the emergency department today for further evaluation of a scrotal abscess.  This wound has been present for 2 days. No fever or chills. Not actively draining.   Chart review does reveal the patient was here on 06/08/2024 for similar symptoms.  He had an ultrasound which did show a scrotal wall abscess.  This was drained in the emergency department.    Abscess      Prior to Admission medications   Medication Sig Start Date End Date Taking? Authorizing Provider  levofloxacin  (LEVAQUIN ) 500 MG tablet Take 1 tablet (500 mg total) by mouth daily. 08/29/24  Yes Theotis, Sherah Lund M, PA-C  allopurinol (ZYLOPRIM) 300 MG tablet Take 300 mg by mouth daily. 11/16/15   [provider]  amLODipine  (NORVASC ) 5 MG tablet Take 1 tablet (5 mg total) by mouth daily. 09/10/20   Wieters, Hallie C, PA-C  blood glucose meter kit and supplies KIT Dispense based on patient and insurance preference. Use up to four times daily as directed. (FOR ICD-9 250.00, 250.01). 01/20/20   Christobal Guadalajara, MD  busPIRone (BUSPAR) 10 MG tablet Take 10 mg by mouth 3 (three) times daily. 09/10/19   [provider]  clindamycin  (CLINDAGEL) 1 % gel Apply 1 application topically See admin instructions. Apply to bumps on face and body 1-2 times daily as needed. 12/06/19   [provider]  ferrous sulfate 325 (65 FE) MG tablet Take 325 mg by mouth daily.    [provider]  furosemide (LASIX) 20 MG tablet Take 20 mg by mouth daily. 02/11/20   [provider]  gabapentin (NEURONTIN) 600 MG tablet Take 600 mg by mouth 3 (three) times daily. 12/31/19   [provider]  ibuprofen  (ADVIL ) 800 MG tablet  Take 800 mg by mouth every 8 (eight) hours as needed.    [provider]  Insulin  Pen Needle (PEN NEEDLES 3/16) 31G X 5 MM MISC USE WITH INSULIN  PEN 01/20/20   Christobal Guadalajara, MD  Magnesium 200 MG TABS Take 200 mg by mouth daily.    [provider]  metoprolol  succinate (TOPROL -XL) 25 MG 24 hr tablet Take 1 tablet (25 mg total) by mouth daily. 09/10/20   Wieters, Hallie C, PA-C  Milk Thistle Extract 175 MG TABS Take 175 mg by mouth daily.    [provider]  Omega-3 1000 MG CAPS Take 1 capsule by mouth daily.    [provider]  oxyCODONE -acetaminophen  (PERCOCET/ROXICET) 5-325 MG tablet Take 1 tablet by mouth every 6 (six) hours as needed for severe pain (pain score 7-10). 06/08/24   Bauer, Collin S, PA-C  semaglutide -weight management (WEGOVY ) 0.25 MG/0.5ML SOAJ SQ injection Inject 0.25 mg into the skin once a week. 06/30/24     sildenafil (REVATIO) 20 MG tablet SMARTSIG:1-5 Tablet(s) By Mouth Daily PRN 05/18/20   [provider]  triamcinolone cream (KENALOG) 0.1 % Apply 1 application topically 2 (two) times daily as needed.  12/19/15   [provider]  Vitamin D, Ergocalciferol, (DRISDOL) 1.25 MG (50000 UNIT) CAPS capsule Take 50,000 Units by mouth once a week. 12/31/19   [provider]  insulin  aspart (NOVOLOG  FLEXPEN) 100 UNIT/ML FlexPen Inject 4 Units into  the skin 3 (three) times daily with meals. 01/24/20 07/01/20  Raenelle Coria, MD  insulin  glargine (LANTUS  SOLOSTAR) 100 UNIT/ML Solostar Pen Inject 10 Units into the skin 2 (two) times daily. 01/24/20 04/23/20  Raenelle Coria, MD    Allergies: Amoxicillin  and Other    Review of Systems  Updated Vital Signs BP 135/75 (BP Location: Left Arm)   Pulse (!) 109   Temp 98.8 F (37.1 C) (Oral)   Resp 18   SpO2 98%   Physical Exam  (all labs ordered are listed, but only abnormal results are displayed) Labs Reviewed - No data to display   EKG: None  Radiology: US  Scrotum Result  Date: 08/29/2024 CLINICAL DATA:  Testicular abscess. EXAM: ULTRASOUND OF SCROTUM TECHNIQUE: Complete ultrasound examination of the testicles, epididymis, and other scrotal structures was performed. COMPARISON:  Ultrasound dated 06/08/2024. FINDINGS: Right testicle Measurements: 3.7 x 1.9 x 3.4 cm. Faint areas of decreased echogenicity within the testicle may represent mild edema. No focal mass. There is increased testicular vascularity. Left testicle Measurements: 3.5 x 2.0 x 2.9 cm. No mass or microlithiasis visualized. Right epididymis: There is a 1.5 x 1.6 x 1.8 cm heterogeneous and hypoechoic area with increased vascularity posterior to the right testicle which may be associated with the scrotal wall or less likely the epididymal tail. This may represent a phlegmon or developing abscess in the scrotal wall or epididymitis. There is a 4.1 x 2.6 x 3.2 cm collection with surrounding hyperemia in the medial left scrotal wall corresponding to the collection seen on the prior ultrasound most consistent with an abscess. There is diffuse scrotal wall edema and hyperemia. Left epididymis:  The visualized left pelvis appears unremarkable. Hydrocele:  None visualized. Varicocele:  None visualized. Bilateral inguinal enlarged and hyperemic lymph nodes may be reactive. IMPRESSION: 1. Diffuse scrotal wall edema and hyperemia with complex collection/abscess in the medial left scrotal wall. 2. Hypoechoic and hyperemic area posterior to the right testicle may represent a phlegmon within the scrotal wall or focal epididymitis. 3. Mildly heterogeneous right testicle with hyperemia suspicious for orchitis. 4. Bilateral inguinal adenopathy. Electronically Signed   By: Vanetta Chou M.D.   On: 08/29/2024 14:55     Procedures   Medications Ordered in the ED  oxyCODONE -acetaminophen  (PERCOCET/ROXICET) 5-325 MG per tablet 2 tablet (has no administration in time range)  lidocaine  (PF) (XYLOCAINE ) 1 % injection 5 mL (has no  administration in time range)     Medical Decision Making Glenn Murphy is a 48 y.o. male patient who presents to the emergency department today for further evaluation of scrotal abscess.  This is a recurrent issue for the patient.  Patient seen here couple of times for similar.  Decided to get an scrotal ultrasound to get a better visualization of the abscess.  Low suspicion for torsion.  Results are still pending.  When patient got back from ultrasound the abscess started spontaneously draining.  Patient manually expressed himself the abscess with 5 rags.  I wanted to continue to perform an incision and drainage for the patient to thoroughly irrigated out.  Patient would rather go home and continue warm compresses.  Again, this is not a new problem for the patient.  Patient can follow-up with the scrotal ultrasound on MyChart.  Patient agreeable with plan.  Offered pain medication which she declined.  Patient overall safe for discharge at this time.  Strict return precautions were discussed.   Amount and/or Complexity of Data Reviewed Labs: ordered. Radiology: ordered.  Risk Prescription drug management.     Final diagnoses:  Scrotal abscess    ED Discharge Orders          Ordered    levofloxacin  (LEVAQUIN ) 500 MG tablet  Daily        08/29/24 1508               Theotis Peers [place], [place] 08/29/24 1456    Emil Share, DO 08/29/24 1457    Theotis Peers M, PA-C 08/29/24 1511    Emil Share, DO 08/30/24 [phone]

## 2024-08-29 NOTE — ED Notes (Signed)
 Attempted to obtain blood work from pt. Pt states I don't want to be stuck more than twice. Unsuccessful attempts to stick; pt needs US  IV, charge and other members notified for help

## 2024-08-29 NOTE — ED Triage Notes (Signed)
 Pt arriving POV for issue with testicular abscess. Pt reports he has had multiple issues with fistulas and UTIs.

## 2024-08-29 NOTE — Discharge Instructions (Signed)
 Continue using warm compresses at home.  You may take your pain medication at home.  Follow-up with urology.  You can return to the emergency department for any worsening symptoms.
